# Patient Record
Sex: Female | Born: 1990 | Race: Asian | Hispanic: No | Marital: Single | Smoking: Current every day smoker
Health system: Southern US, Community
[De-identification: ages and names within clinical notes are randomized; demographics above are authoritative.]

## PROBLEM LIST (undated history)

## (undated) ENCOUNTER — Emergency Department (HOSPITAL_COMMUNITY): Payer: Self-pay | Source: Home / Self Care

## (undated) ENCOUNTER — Ambulatory Visit (HOSPITAL_COMMUNITY): Payer: Medicaid Other

## (undated) DIAGNOSIS — F32A Depression, unspecified: Secondary | ICD-10-CM

## (undated) DIAGNOSIS — F419 Anxiety disorder, unspecified: Secondary | ICD-10-CM

## (undated) DIAGNOSIS — W3400XA Accidental discharge from unspecified firearms or gun, initial encounter: Secondary | ICD-10-CM

## (undated) DIAGNOSIS — I4891 Unspecified atrial fibrillation: Secondary | ICD-10-CM

## (undated) DIAGNOSIS — Z1589 Genetic susceptibility to other disease: Secondary | ICD-10-CM

## (undated) DIAGNOSIS — F329 Major depressive disorder, single episode, unspecified: Secondary | ICD-10-CM

## (undated) HISTORY — PX: ABDOMINAL SURGERY: SHX537

## (undated) HISTORY — PX: NO PAST SURGERIES: SHX2092

---

## 2011-07-10 ENCOUNTER — Emergency Department (HOSPITAL_COMMUNITY)
Admission: EM | Admit: 2011-07-10 | Discharge: 2011-07-10 | Discharge status: Against Medical Advice | Payer: Self-pay | Attending: Emergency Medicine | Admitting: Emergency Medicine

## 2011-07-10 ENCOUNTER — Encounter: Payer: Self-pay | Admitting: *Deleted

## 2011-07-10 DIAGNOSIS — R51 Headache: Secondary | ICD-10-CM | POA: Insufficient documentation

## 2011-07-10 DIAGNOSIS — R109 Unspecified abdominal pain: Secondary | ICD-10-CM | POA: Insufficient documentation

## 2011-07-10 NOTE — ED Notes (Signed)
LLQ abdominal pain for the last 2 day.  No n/v with this.  Mild diarrhea with this.  No body aches, no fever or chills.  Pt also reports HA

## 2011-09-03 ENCOUNTER — Encounter (HOSPITAL_COMMUNITY): Payer: Self-pay | Admitting: *Deleted

## 2011-09-03 ENCOUNTER — Emergency Department (HOSPITAL_COMMUNITY)
Admission: EM | Admit: 2011-09-03 | Discharge: 2011-09-04 | Disposition: A | Discharge status: Home / Self Care | Payer: Self-pay | Attending: Emergency Medicine | Admitting: Emergency Medicine

## 2011-09-03 DIAGNOSIS — R109 Unspecified abdominal pain: Secondary | ICD-10-CM | POA: Insufficient documentation

## 2011-09-03 DIAGNOSIS — IMO0001 Reserved for inherently not codable concepts without codable children: Secondary | ICD-10-CM | POA: Insufficient documentation

## 2011-09-03 DIAGNOSIS — Z202 Contact with and (suspected) exposure to infections with a predominantly sexual mode of transmission: Secondary | ICD-10-CM | POA: Insufficient documentation

## 2011-09-03 DIAGNOSIS — R070 Pain in throat: Secondary | ICD-10-CM | POA: Insufficient documentation

## 2011-09-03 LAB — POCT PREGNANCY, URINE: Preg Test, Ur: NEGATIVE

## 2011-09-03 MED ORDER — CEFTRIAXONE SODIUM 250 MG IJ SOLR
250.0000 mg | Freq: Once | INTRAMUSCULAR | Status: AC
  Administered 2011-09-03: 250 mg via INTRAMUSCULAR
  Filled 2011-09-03: qty 250

## 2011-09-03 MED ORDER — AZITHROMYCIN 250 MG PO TABS
1000.0000 mg | ORAL_TABLET | Freq: Once | ORAL | Status: AC
  Administered 2011-09-03: 1000 mg via ORAL
  Filled 2011-09-03: qty 4

## 2011-09-03 MED ORDER — LIDOCAINE HCL (PF) 1 % IJ SOLN
INTRAMUSCULAR | Status: AC
  Administered 2011-09-03: 0.9 mL
  Filled 2011-09-03: qty 5

## 2011-09-03 NOTE — ED Provider Notes (Signed)
History     CSN: [id number]  Arrival date & time 09/03/11  2038   First MD Initiated Contact with Patient 09/03/11 2144      Chief Complaint  Patient presents with  . Influenza    (Consider location/radiation/quality/duration/timing/severity/associated sxs/prior treatment) HPI Comments: Patient has had some myalgias, sore throat.  No abdominal pain, was informed today that her partner is positive for Chlamydia.  She denies vaginal discharge at this time, but does admit to oral in the course  The history is provided by the patient.    History reviewed. No pertinent past medical history.  History reviewed. No pertinent past surgical history.  No family history on file.  History  Substance Use Topics  . Smoking status: Not on file  . Smokeless tobacco: Not on file  . Alcohol Use: No    OB History    Grav Para Term Preterm Abortions TAB SAB Ect Mult Living                  Review of Systems  Constitutional: Negative for fever and chills.  Gastrointestinal: Positive for abdominal pain. Negative for nausea, vomiting and diarrhea.  Genitourinary: Negative for vaginal discharge.  Musculoskeletal: Positive for myalgias.  Skin: Negative.     Allergies  Review of patient's allergies indicates no known allergies.  Home Medications  No current outpatient prescriptions on file.  BP 132/68  Pulse 110  Temp(Src) 98.3 F (36.8 C) (Oral)  Resp 16  SpO2 98%  Physical Exam  Constitutional: She appears well-developed and well-nourished.  HENT:  Head: Normocephalic.  Neck: Normal range of motion.  Pulmonary/Chest: Effort normal.  Abdominal: Soft. There is no tenderness. There is no rebound and no guarding.  Genitourinary: Vagina normal.  Musculoskeletal: Normal range of motion.  Neurological: She is alert.  Skin: Skin is warm and dry.  Psychiatric: She has a normal mood and affect.    ED Course  Procedures (including critical care time)  Labs Reviewed    URINALYSIS, ROUTINE W REFLEX MICROSCOPIC - Abnormal; Notable for the following:    APPearance CLOUDY (*)    Specific Gravity, Urine 1.038 (*)    Hgb urine dipstick TRACE (*)    All other components within normal limits  WET PREP, GENITAL  POCT PREGNANCY, URINE  URINE MICROSCOPIC-ADD ON  GC/CHLAMYDIA PROBE AMP, GENITAL  POCT PREGNANCY, URINE   No results found.   1. Exposure to STD       MDM  STD   Medical screening examination/treatment/procedure(s) were performed by non-physician practitioner and as supervising physician I was immediately available for consultation/collaboration. Osvaldo Human, M.D.       Arman Filter, NP 09/03/11 2312  Arman Filter, NP 09/04/11 4098  Carleene Cooper III, MD 09/05/11 [phone]

## 2011-09-03 NOTE — ED Notes (Signed)
Pt has had body aches, abdominal pain and sore throat for one week.  No n/v with this.  Pt sister had the flu recently

## 2011-09-04 LAB — URINE MICROSCOPIC-ADD ON

## 2011-09-04 LAB — URINALYSIS, ROUTINE W REFLEX MICROSCOPIC
Glucose, UA: NEGATIVE mg/dL
Specific Gravity, Urine: 1.038 — ABNORMAL HIGH (ref 1.005–1.030)

## 2011-09-05 LAB — GC/CHLAMYDIA PROBE AMP, GENITAL: GC Probe Amp, Genital: NEGATIVE

## 2011-11-19 ENCOUNTER — Emergency Department (HOSPITAL_COMMUNITY)
Admission: EM | Admit: 2011-11-19 | Discharge: 2011-11-20 | Discharge status: Against Medical Advice | Payer: BC Managed Care – PPO | Attending: Emergency Medicine | Admitting: Emergency Medicine

## 2011-11-19 ENCOUNTER — Encounter (HOSPITAL_COMMUNITY): Payer: Self-pay | Admitting: Emergency Medicine

## 2011-11-19 DIAGNOSIS — R42 Dizziness and giddiness: Secondary | ICD-10-CM | POA: Insufficient documentation

## 2011-11-19 DIAGNOSIS — H538 Other visual disturbances: Secondary | ICD-10-CM | POA: Insufficient documentation

## 2011-11-19 NOTE — ED Notes (Signed)
Pt states she receives the depo shot and has not had a period in the past 3 years but started about a week and a half ago and is still bleeding  Concerned and wants to be checked

## 2011-11-19 NOTE — ED Notes (Signed)
Pt states she woke up to go to work around 9pm with dizziness and blurred vision that are still there  Pt states as she was driving to work her vision is so blurred she could barely see  Pt came here instead for evaluation

## 2012-03-28 ENCOUNTER — Emergency Department (HOSPITAL_COMMUNITY)
Admission: EM | Admit: 2012-03-28 | Discharge: 2012-03-28 | Disposition: A | Discharge status: Home / Self Care | Payer: BC Managed Care – PPO | Attending: Emergency Medicine | Admitting: Emergency Medicine

## 2012-03-28 DIAGNOSIS — M549 Dorsalgia, unspecified: Secondary | ICD-10-CM

## 2012-03-28 DIAGNOSIS — T148XXA Other injury of unspecified body region, initial encounter: Secondary | ICD-10-CM

## 2012-03-28 DIAGNOSIS — X58XXXA Exposure to other specified factors, initial encounter: Secondary | ICD-10-CM | POA: Insufficient documentation

## 2012-03-28 DIAGNOSIS — I10 Essential (primary) hypertension: Secondary | ICD-10-CM | POA: Insufficient documentation

## 2012-03-28 LAB — POCT I-STAT, CHEM 8
Hemoglobin: 13.3 g/dL (ref 12.0–15.0)
Sodium: 141 mEq/L (ref 135–145)
TCO2: 22 mmol/L (ref 0–100)

## 2012-03-28 MED ORDER — IBUPROFEN 800 MG PO TABS: 800.0000 mg | ORAL_TABLET | Freq: Three times a day (TID) | ORAL | Status: AC | PRN

## 2012-03-28 NOTE — ED Provider Notes (Signed)
History     CSN: [id number]  Arrival date & time 03/28/12  0133   First MD Initiated Contact with Patient 03/28/12 0425      Chief Complaint  Patient presents with  . Back Pain    chest wall pain   HPI  History provided by the patient and mother. Patient is a 21 year old female with no significant past medical history who presents with complaints of left upper back pain and soreness. Symptoms began 2 days ago. Patient states she initially had some soreness in her left upper chest area but now has pain in her left mid back. Pain is worse with movements of her arm and activity. Patient denies any injury or trauma but does report increased activity playing wii video games. Patient has not taken any medications or used any treatment for symptoms. Symptoms are not associated with any shortness of breath, pleuritic pains, cough, hemoptysis, lightheadedness, nausea or diaphoresis. Patient denies having similar symptoms previously. Patient has not had any recent travel no recent surgery no prior history of cancer no prior history of DVT or PE no history of clotting disorder.    No past medical history on file.  No past surgical history on file.  Family History  Problem Relation Age of Onset  . Hypertension Other     History  Substance Use Topics  . Smoking status: Never Smoker   . Smokeless tobacco: Not on file  . Alcohol Use: No    OB History    Grav Para Term Preterm Abortions TAB SAB Ect Mult Living                  Review of Systems  Constitutional: Negative for fever, chills and diaphoresis.  HENT: Negative for neck pain.   Respiratory: Negative for cough and shortness of breath.   Gastrointestinal: Negative for nausea.  Musculoskeletal: Positive for back pain.  Neurological: Negative for dizziness, syncope and light-headedness.    Allergies  Review of patient's allergies indicates no known allergies.  Home Medications   Current Outpatient Rx  Name Route Sig  Dispense Refill  . MEDROXYPROGESTERONE ACETATE 104 MG/0.65ML [place] SUSP Subcutaneous Inject 104 mg into the skin every 3 (three) months.      BP 119/77  Pulse 77  Temp 99 F (37.2 C) (Oral)  Resp 20  SpO2 98%  Physical Exam  Nursing note and vitals reviewed. Constitutional: She is oriented to person, place, and time. She appears well-developed and well-nourished. No distress.  HENT:  Head: Normocephalic.  Cardiovascular: Normal rate and regular rhythm.   No murmur heard. Pulmonary/Chest: Effort normal and breath sounds normal. No respiratory distress. She has no wheezes. She has no rales.  Abdominal: Soft.  Musculoskeletal: Normal range of motion. She exhibits no edema and no tenderness.       Thoracic back: She exhibits tenderness. She exhibits no bony tenderness.       Back:       Reproducible pain over left lower trapezius and rhomboid area. Some tightness to the underlying muscle.  Neurological: She is alert and oriented to person, place, and time.  Skin: Skin is warm and dry. No erythema.  Psychiatric: She has a normal mood and affect. Her behavior is normal.    ED Course  Procedures  Results for orders placed during the hospital encounter of 03/28/12  POCT I-STAT, CHEM 8      Component Value Range   Sodium 141  135 - 145 mEq/L   Potassium 4.3  3.5 - 5.1 mEq/L   Chloride 107  96 - 112 mEq/L   BUN 7  6 - 23 mg/dL   Creatinine, Ser 2.13  0.50 - 1.10 mg/dL   Glucose, Bld 95  70 - 99 mg/dL   Calcium, Ion 0.86  5.78 - 1.23 mmol/L   TCO2 22  0 - 100 mmol/L   Hemoglobin 13.3  12.0 - 15.0 g/dL   HCT 46.9  62.9 - 52.8 %       1. Muscle strain   2. Back pain       MDM  4:50AM patient seen and evaluated. Patient with no recent travel, no extremity pain or swelling, no recent surgery, no prior history of cancer, no prior history of DVT or PE, no hemoptysis, no shortness of breath, no tachycardia, normal oxygen saturation. At this time doubt PE. Patient is young and  otherwise healthy with a normal EKG and symptoms are not typical for ACS. Pain is reproducible to palpation over the left trapezius and rhomboid area. Soreness and pain is exacerbated with range of motion of left upper extremity.  I discussed with the patient and mother my low suspicion for any concerning or emergent cause of symptoms. I did offer for further testing including a d-dimer to further rule out possible PE but at this time he wished to return home and try symptomatic treatment for muscle strain. They're given strict return precautions and advised he may return at anytime for continued evaluation and treatment.     Date: 03/28/2012  Rate: 79  Rhythm: normal sinus rhythm  QRS Axis: normal  Intervals: normal  ST/T Wave abnormalities: normal  Conduction Disutrbances:none and first-degree A-V block   Narrative Interpretation:   Old EKG Reviewed: none available          Angus Seller, [place] 03/28/12 [phone]

## 2012-03-28 NOTE — ED Provider Notes (Signed)
Medical screening examination/treatment/procedure(s) were performed by non-physician practitioner and as supervising physician I was immediately available for consultation/collaboration.  Melton Walls, MD 03/28/12 0825 

## 2012-03-28 NOTE — ED Notes (Signed)
Pt c/o upper back pain pt felt like heat skipped . NO LOC/N/V.VSS.PWD

## 2012-03-28 NOTE — ED Notes (Signed)
EKG printed and given to EDP [facility] for review. No previous EKG available for comparison.

## 2012-11-25 ENCOUNTER — Emergency Department (HOSPITAL_COMMUNITY)
Admission: EM | Admit: 2012-11-25 | Discharge: 2012-11-25 | Disposition: A | Discharge status: Home / Self Care | Payer: Self-pay | Attending: Emergency Medicine | Admitting: Emergency Medicine

## 2012-11-25 ENCOUNTER — Encounter (HOSPITAL_COMMUNITY): Payer: Self-pay | Admitting: Emergency Medicine

## 2012-11-25 DIAGNOSIS — R110 Nausea: Secondary | ICD-10-CM | POA: Insufficient documentation

## 2012-11-25 DIAGNOSIS — B9689 Other specified bacterial agents as the cause of diseases classified elsewhere: Secondary | ICD-10-CM

## 2012-11-25 DIAGNOSIS — N760 Acute vaginitis: Secondary | ICD-10-CM | POA: Insufficient documentation

## 2012-11-25 DIAGNOSIS — R10817 Generalized abdominal tenderness: Secondary | ICD-10-CM | POA: Insufficient documentation

## 2012-11-25 DIAGNOSIS — Z3202 Encounter for pregnancy test, result negative: Secondary | ICD-10-CM | POA: Insufficient documentation

## 2012-11-25 LAB — URINALYSIS, MICROSCOPIC ONLY
Glucose, UA: NEGATIVE mg/dL
Ketones, ur: 15 mg/dL — AB
Leukocytes, UA: NEGATIVE
Nitrite: NEGATIVE
Protein, ur: NEGATIVE mg/dL
Specific Gravity, Urine: 1.034 — ABNORMAL HIGH (ref 1.005–1.030)
Urobilinogen, UA: 1 mg/dL (ref 0.0–1.0)
pH: 5.5 (ref 5.0–8.0)

## 2012-11-25 LAB — WET PREP, GENITAL

## 2012-11-25 LAB — POCT PREGNANCY, URINE: Preg Test, Ur: NEGATIVE

## 2012-11-25 MED ORDER — AZITHROMYCIN 250 MG PO TABS
1000.0000 mg | ORAL_TABLET | Freq: Once | ORAL | Status: AC
  Administered 2012-11-25: 1000 mg via ORAL
  Filled 2012-11-25: qty 4

## 2012-11-25 MED ORDER — LIDOCAINE HCL (PF) 1 % IJ SOLN
INTRAMUSCULAR | Status: AC
  Administered 2012-11-25: 3 mL
  Filled 2012-11-25: qty 5

## 2012-11-25 MED ORDER — CEFTRIAXONE SODIUM 250 MG IJ SOLR
250.0000 mg | Freq: Once | INTRAMUSCULAR | Status: AC
  Administered 2012-11-25: 250 mg via INTRAMUSCULAR
  Filled 2012-11-25: qty 250

## 2012-11-25 MED ORDER — METRONIDAZOLE 500 MG PO TABS: 500.0000 mg | ORAL_TABLET | Freq: Two times a day (BID) | ORAL | Status: DC

## 2012-11-25 NOTE — ED Notes (Signed)
PA at bedside.

## 2012-11-25 NOTE — ED Provider Notes (Signed)
History     CSN: [id number]  Arrival date & time 11/25/12  1410   First MD Initiated Contact with Patient 11/25/12 1431      Chief Complaint  Patient presents with  . Abdominal Pain    (Consider location/radiation/quality/duration/timing/severity/associated sxs/prior treatment) HPI Comments: Patient is a 22 year old female who presents with a 2 week history of lower abdominal pain. The pain is located in her lower abdomen and does not radiate. The pain is described as cramping and moderate. The pain started gradually and progressively worsened since the onset. No alleviating/aggravating factors. The patient has tried nothing for symptoms without relief. Associated symptoms include nausea. Patient denies fever, headache, vomiting, diarrhea, chest pain, SOB, dysuria, constipation, abnormal vaginal bleeding/discharge. LMP 2010 and patient started using Depo. Patient has not used Depo in 4 months.     Patient is a 22 y.o. female presenting with abdominal pain.  Abdominal Pain Associated symptoms: nausea     History reviewed. No pertinent past medical history.  History reviewed. No pertinent past surgical history.  Family History  Problem Relation Age of Onset  . Hypertension Other     History  Substance Use Topics  . Smoking status: Never Smoker   . Smokeless tobacco: Not on file  . Alcohol Use: No    OB History   Grav Para Term Preterm Abortions TAB SAB Ect Mult Living                  Review of Systems  Gastrointestinal: Positive for nausea and abdominal pain.  All other systems reviewed and are negative.    Allergies  Review of patient's allergies indicates no known allergies.  Home Medications   Current Outpatient Rx  Name  Route  Sig  Dispense  Refill  . Multiple Vitamins-Calcium (ONE-A-DAY WOMENS FORMULA PO)   Oral   Take 1 tablet by mouth daily.           BP 143/84  Pulse 69  Temp(Src) 98.5 F (36.9 C) (Oral)  Resp 20  SpO2 99%  Physical  Exam  Nursing note and vitals reviewed. Constitutional: She is oriented to person, place, and time. She appears well-developed and well-nourished. No distress.  HENT:  Head: Normocephalic and atraumatic.  Eyes: Conjunctivae are normal.  Cardiovascular: Normal rate and regular rhythm.  Exam reveals no gallop and no friction rub.   No murmur heard. Pulmonary/Chest: Effort normal and breath sounds normal. She has no wheezes. She has no rales. She exhibits no tenderness.  Abdominal: Soft. She exhibits no distension. There is tenderness. There is no rebound and no guarding.  Generalized lower abdominal tenderness to palpation.   Genitourinary:  Thick, white discharge noted. Cervical os closed. Mild tenderness of lower abdomen on bimanual exam. No abnormal masses or adnexal tenderness noted.   Musculoskeletal: Normal range of motion.  Neurological: She is alert and oriented to person, place, and time. Coordination normal.  Speech is goal-oriented. Moves limbs without ataxia.   Skin: Skin is warm and dry.  Psychiatric: She has a normal mood and affect. Her behavior is normal.    ED Course  Procedures (including critical care time)  Labs Reviewed  WET PREP, GENITAL - Abnormal; Notable for the following:    Clue Cells Wet Prep HPF POC FEW (*)    WBC, Wet Prep HPF POC FEW (*)    All other components within normal limits  URINALYSIS, MICROSCOPIC ONLY - Abnormal; Notable for the following:    Color, Urine  AMBER (*)    APPearance HAZY (*)    Specific Gravity, Urine 1.034 (*)    Hgb urine dipstick SMALL (*)    Bilirubin Urine SMALL (*)    Ketones, ur 15 (*)    Bacteria, UA FEW (*)    Squamous Epithelial / LPF FEW (*)    All other components within normal limits  GC/CHLAMYDIA PROBE AMP  POCT PREGNANCY, URINE   No results found.   1. BV (bacterial vaginosis)       MDM  3:10 PM Labs, urinalysis, and urine pregnancy pending. Wet prep and GC chlamydia pending. Patient declines pain  medication at this time.   3:25 PM Wet prep shows clue cells and WBCs. I will treat the patient for BV and for GC/Chlamydia prophylactically. Urinalysis pending. Urine pregnancy negative. Patient instructed to return with worsening or concerning symptoms.     Emilia Beck, PA-C 11/27/12 1925

## 2012-11-25 NOTE — ED Notes (Signed)
Pt c/o lower abd pain and side pain x 2 weeks; pt sts some dark urine; pt sts LMP was in 2010 and sts went off depo in December

## 2012-11-30 NOTE — ED Provider Notes (Signed)
Medical screening examination/treatment/procedure(s) were performed by non-physician practitioner and as supervising physician I was immediately available for consultation/collaboration.  Raeford Razor, MD 11/30/12 1241

## 2013-05-11 LAB — HM HIV SCREENING LAB: HM HIV Screening: NEGATIVE

## 2014-08-01 ENCOUNTER — Emergency Department (HOSPITAL_COMMUNITY)
Admission: EM | Admit: 2014-08-01 | Discharge: 2014-08-01 | Disposition: A | Discharge status: Home / Self Care | Payer: Medicaid Other | Attending: Emergency Medicine | Admitting: Emergency Medicine

## 2014-08-01 ENCOUNTER — Encounter (HOSPITAL_COMMUNITY): Payer: Self-pay | Admitting: *Deleted

## 2014-08-01 DIAGNOSIS — Z3202 Encounter for pregnancy test, result negative: Secondary | ICD-10-CM | POA: Diagnosis not present

## 2014-08-01 DIAGNOSIS — N939 Abnormal uterine and vaginal bleeding, unspecified: Secondary | ICD-10-CM | POA: Insufficient documentation

## 2014-08-01 LAB — CBC WITH DIFFERENTIAL/PLATELET
BASOS PCT: 0 % (ref 0–1)
Basophils Absolute: 0 10*3/uL (ref 0.0–0.1)
EOS PCT: 2 % (ref 0–5)
Eosinophils Absolute: 0.1 10*3/uL (ref 0.0–0.7)
HEMATOCRIT: 38.2 % (ref 36.0–46.0)
HEMOGLOBIN: 13.3 g/dL (ref 12.0–15.0)
LYMPHS PCT: 35 % (ref 12–46)
Lymphs Abs: 2.9 10*3/uL (ref 0.7–4.0)
MCH: 28.1 pg (ref 26.0–34.0)
MCHC: 34.8 g/dL (ref 30.0–36.0)
MCV: 80.6 fL (ref 78.0–100.0)
MONO ABS: 0.4 10*3/uL (ref 0.1–1.0)
MONOS PCT: 5 % (ref 3–12)
NEUTROS ABS: 4.7 10*3/uL (ref 1.7–7.7)
Neutrophils Relative %: 58 % (ref 43–77)
Platelets: 319 10*3/uL (ref 150–400)
RBC: 4.74 MIL/uL (ref 3.87–5.11)
RDW: 13 % (ref 11.5–15.5)
WBC: 8.1 10*3/uL (ref 4.0–10.5)

## 2014-08-01 LAB — WET PREP, GENITAL
TRICH WET PREP: NONE SEEN
YEAST WET PREP: NONE SEEN

## 2014-08-01 LAB — POC URINE PREG, ED: Preg Test, Ur: NEGATIVE

## 2014-08-01 NOTE — ED Notes (Signed)
MD at bedside. 

## 2014-08-01 NOTE — ED Notes (Signed)
Pt reports vaginal bleeding. Pt states she bleeds every time she has intercourse. Pt noticed the trend 2 1/2 months ago. Last time pt had sex was yesterday. Pt states she also bleeds during sex. Pt denies clots.

## 2014-08-01 NOTE — ED Provider Notes (Signed)
CSN: [id number]     Arrival date & time 08/01/14  2009 History   First MD Initiated Contact with Patient 08/01/14 2125     Chief Complaint  Patient presents with  . Vaginal Bleeding     (Consider location/radiation/quality/duration/timing/severity/associated sxs/prior Treatment) Patient is a 23 y.o. female presenting with vaginal bleeding. The history is provided by the patient.  Vaginal Bleeding Quality:  Bright red Severity:  Moderate Duration: 2 and a half months. Timing:  Intermittent (only during sex) Progression:  Unchanged Chronicity:  New Possible pregnancy: no   Context: during intercourse   Relieved by: not having intercourse. Worsened by:  Nothing tried Ineffective treatments:  None tried Associated symptoms: no abdominal pain, no back pain, no dizziness, no dysuria, no fatigue, no fever, no nausea and no vaginal discharge     History reviewed. No pertinent past medical history. History reviewed. No pertinent past surgical history. Family History  Problem Relation Age of Onset  . Hypertension Other    History  Substance Use Topics  . Smoking status: Never Smoker   . Smokeless tobacco: Not on file  . Alcohol Use: No   OB History    No data available     Review of Systems  Constitutional: Negative for fever, chills, diaphoresis, activity change, appetite change and fatigue.  HENT: Negative for facial swelling, rhinorrhea, sore throat, trouble swallowing and voice change.   Eyes: Negative for photophobia, pain and visual disturbance.  Respiratory: Negative for cough, shortness of breath, wheezing and stridor.   Cardiovascular: Negative for chest pain, palpitations and leg swelling.  Gastrointestinal: Negative for nausea, vomiting, abdominal pain, constipation and anal bleeding.  Endocrine: Negative.   Genitourinary: Positive for vaginal bleeding. Negative for dysuria, urgency, hematuria, vaginal discharge, vaginal pain, menstrual problem and pelvic pain.   Musculoskeletal: Negative for myalgias, back pain and arthralgias.  Skin: Negative.  Negative for rash.  Allergic/Immunologic: Negative.   Neurological: Negative for dizziness, tremors, syncope, weakness and headaches.  Psychiatric/Behavioral: Negative for suicidal ideas, sleep disturbance and self-injury.  All other systems reviewed and are negative.     Allergies  Review of patient's allergies indicates no known allergies.  Home Medications   Prior to Admission medications   Not on File   BP 118/75 mmHg  Pulse 68  Temp(Src) 98.4 F (36.9 C) (Oral)  Resp 16  Ht 5\' 6"  (1.676 m)  Wt 240 lb (108.863 kg)  BMI 38.76 kg/m2  SpO2 100% Physical Exam  Constitutional: She is oriented to person, place, and time. She appears well-developed and well-nourished. No distress.  HENT:  Head: Normocephalic and atraumatic.  Right Ear: External ear normal.  Left Ear: External ear normal.  Mouth/Throat: Oropharynx is clear and moist. No oropharyngeal exudate.  Eyes: Conjunctivae and EOM are normal. Pupils are equal, round, and reactive to light. No scleral icterus.  Neck: Normal range of motion. Neck supple. No JVD present. No tracheal deviation present. No thyromegaly present.  Cardiovascular: Normal rate, regular rhythm and intact distal pulses.  Exam reveals no gallop and no friction rub.   No murmur heard. Pulmonary/Chest: Effort normal and breath sounds normal. No respiratory distress. She has no wheezes. She has no rales.  Abdominal: Soft. Bowel sounds are normal. She exhibits no distension. There is no tenderness.  Genitourinary: Vagina normal and uterus normal. Pelvic exam was performed with patient supine. There is no rash or tenderness on the right labia. There is no rash or tenderness on the left labia. Uterus is not deviated  and not enlarged. Cervix exhibits no motion tenderness, no discharge and no friability. Right adnexum displays no mass, no tenderness and no fullness. Left  adnexum displays no mass, no tenderness and no fullness. No signs of injury around the vagina. No vaginal discharge found.  Musculoskeletal: Normal range of motion. She exhibits no edema or tenderness.  Neurological: She is alert and oriented to person, place, and time. No cranial nerve deficit. She exhibits normal muscle tone. Coordination normal.  Skin: Skin is warm and dry. She is not diaphoretic. No pallor.  Psychiatric: She has a normal mood and affect. She expresses no homicidal and no suicidal ideation. She expresses no suicidal plans and no homicidal plans.  Nursing note and vitals reviewed.   ED Course  Procedures (including critical care time) Labs Review Labs Reviewed  WET PREP, GENITAL - Abnormal; Notable for the following:    Clue Cells Wet Prep HPF POC MANY (*)    WBC, Wet Prep HPF POC MANY (*)    All other components within normal limits  GC/CHLAMYDIA PROBE AMP  CBC WITH DIFFERENTIAL  RPR  HIV ANTIBODY (ROUTINE TESTING)  POC URINE PREG, ED    Imaging Review No results found.   EKG Interpretation None      MDM   Final diagnoses:  Vaginal bleeding    The patient is a 23 y.o. Female who presents for 2 and a half months of vaginal bleeding during intercourse. Urine preg negative. Hgb wnl. Pelvic exam shows no friability of the service or CMT. Wet prep only remarkable for clue cells but given no vaginal discharge and symptoms only present during intercourse will not treat with flagyl. STD labs sent and will be followed as outpatient. Patient discharged with GYN outpatient followup and standard ED return precautions. Patient expresses understanding and agreement with this plan.   Patient seen with attending, Dr. Criss AlvineGoldston, who oversaw clinical decision making.     Lula OlszewskiMike Shanard Treto, MD 08/02/14 [id number]  Audree CamelScott T Goldston, MD 08/09/14 [phone]

## 2014-08-01 NOTE — ED Notes (Signed)
Discharge instructions reviewed, voiced understanding.  Patient [facility] in the AM for an appointment

## 2014-08-02 LAB — RPR

## 2014-08-02 LAB — HIV ANTIBODY (ROUTINE TESTING W REFLEX): HIV 1&2 Ab, 4th Generation: NONREACTIVE

## 2014-08-03 LAB — GC/CHLAMYDIA PROBE AMP
CT PROBE, AMP APTIMA: NEGATIVE
GC PROBE AMP APTIMA: NEGATIVE

## 2014-08-18 ENCOUNTER — Encounter: Payer: BC Managed Care – PPO | Admitting: Family Medicine

## 2014-09-07 ENCOUNTER — Encounter (HOSPITAL_COMMUNITY): Payer: Self-pay

## 2014-09-07 ENCOUNTER — Emergency Department (HOSPITAL_COMMUNITY): Payer: Medicaid Other

## 2014-09-07 ENCOUNTER — Emergency Department (HOSPITAL_COMMUNITY)
Admission: EM | Admit: 2014-09-07 | Discharge: 2014-09-07 | Disposition: A | Discharge status: Home / Self Care | Payer: Medicaid Other | Attending: Emergency Medicine | Admitting: Emergency Medicine

## 2014-09-07 DIAGNOSIS — S3992XA Unspecified injury of lower back, initial encounter: Secondary | ICD-10-CM | POA: Diagnosis present

## 2014-09-07 DIAGNOSIS — Y9389 Activity, other specified: Secondary | ICD-10-CM | POA: Diagnosis not present

## 2014-09-07 DIAGNOSIS — M546 Pain in thoracic spine: Secondary | ICD-10-CM | POA: Diagnosis not present

## 2014-09-07 DIAGNOSIS — S39012A Strain of muscle, fascia and tendon of lower back, initial encounter: Secondary | ICD-10-CM | POA: Diagnosis not present

## 2014-09-07 DIAGNOSIS — Z3202 Encounter for pregnancy test, result negative: Secondary | ICD-10-CM | POA: Insufficient documentation

## 2014-09-07 DIAGNOSIS — Y998 Other external cause status: Secondary | ICD-10-CM | POA: Insufficient documentation

## 2014-09-07 DIAGNOSIS — Y9289 Other specified places as the place of occurrence of the external cause: Secondary | ICD-10-CM | POA: Insufficient documentation

## 2014-09-07 DIAGNOSIS — M549 Dorsalgia, unspecified: Secondary | ICD-10-CM

## 2014-09-07 DIAGNOSIS — X58XXXA Exposure to other specified factors, initial encounter: Secondary | ICD-10-CM | POA: Insufficient documentation

## 2014-09-07 LAB — URINALYSIS, ROUTINE W REFLEX MICROSCOPIC
Bilirubin Urine: NEGATIVE
GLUCOSE, UA: NEGATIVE mg/dL
Hgb urine dipstick: NEGATIVE
Ketones, ur: NEGATIVE mg/dL
LEUKOCYTES UA: NEGATIVE
NITRITE: NEGATIVE
PROTEIN: NEGATIVE mg/dL
SPECIFIC GRAVITY, URINE: 1.037 — AB (ref 1.005–1.030)
Urobilinogen, UA: 0.2 mg/dL (ref 0.0–1.0)
pH: 5.5 (ref 5.0–8.0)

## 2014-09-07 LAB — POC URINE PREG, ED: PREG TEST UR: NEGATIVE

## 2014-09-07 MED ORDER — KETOROLAC TROMETHAMINE 60 MG/2ML IM SOLN
30.0000 mg | Freq: Once | INTRAMUSCULAR | Status: AC
  Administered 2014-09-07: 30 mg via INTRAMUSCULAR
  Filled 2014-09-07: qty 2

## 2014-09-07 MED ORDER — DIAZEPAM 5 MG PO TABS: 5.0000 mg | ORAL_TABLET | Freq: Three times a day (TID) | ORAL | Status: DC | PRN

## 2014-09-07 NOTE — Discharge Instructions (Signed)
Back Pain, Adult Low back pain is very common. About 1 in 5 people have back pain.The cause of low back pain is rarely dangerous. The pain often gets better over time.About half of people with a sudden onset of back pain feel better in just 2 weeks. About 8 in 10 people feel better by 6 weeks.  CAUSES Some common causes of back pain include:  Strain of the muscles or ligaments supporting the spine.  Wear and tear (degeneration) of the spinal discs.  Arthritis.  Direct injury to the back. DIAGNOSIS Most of the time, the direct cause of low back pain is not known.However, back pain can be treated effectively even when the exact cause of the pain is unknown.Answering your caregiver's questions about your overall health and symptoms is one of the most accurate ways to make sure the cause of your pain is not dangerous. If your caregiver needs more information, he or she may order lab work or imaging tests (X-rays or MRIs).However, even if imaging tests show changes in your back, this usually does not require surgery. HOME CARE INSTRUCTIONS For many people, back pain returns.Since low back pain is rarely dangerous, it is often a condition that people can learn to manageon their own.   Remain active. It is stressful on the back to sit or stand in one place. Do not sit, drive, or stand in one place for more than 30 minutes at a time. Take short walks on level surfaces as soon as pain allows.Try to increase the length of time you walk each day.  Do not stay in bed.Resting more than 1 or 2 days can delay your recovery.  Do not avoid exercise or work.Your body is made to move.It is not dangerous to be active, even though your back may hurt.Your back will likely heal faster if you return to being active before your pain is gone.  Pay attention to your body when you bend and lift. Many people have less discomfortwhen lifting if they bend their knees, keep the load close to their bodies,and  avoid twisting. Often, the most comfortable positions are those that put less stress on your recovering back.  Find a comfortable position to sleep. Use a firm mattress and lie on your side with your knees slightly bent. If you lie on your back, put a pillow under your knees.  Only take over-the-counter or prescription medicines as directed by your caregiver. Over-the-counter medicines to reduce pain and inflammation are often the most helpful.Your caregiver may prescribe muscle relaxant drugs.These medicines help dull your pain so you can more quickly return to your normal activities and healthy exercise.  Put ice on the injured area.  Put ice in a plastic bag.  Place a towel between your skin and the bag.  Leave the ice on for 15-20 minutes, 03-04 times a day for the first 2 to 3 days. After that, ice and heat may be alternated to reduce pain and spasms.  Ask your caregiver about trying back exercises and gentle massage. This may be of some benefit.  Avoid feeling anxious or stressed.Stress increases muscle tension and can worsen back pain.It is important to recognize when you are anxious or stressed and learn ways to manage it.Exercise is a great option. SEEK MEDICAL CARE IF:  You have pain that is not relieved with rest or medicine.  You have pain that does not improve in 1 week.  You have new symptoms.  You are generally not feeling well. SEEK   IMMEDIATE MEDICAL CARE IF:   You have pain that radiates from your back into your legs.  You develop new bowel or bladder control problems.  You have unusual weakness or numbness in your arms or legs.  You develop nausea or vomiting.  You develop abdominal pain.  You feel faint. Document Released: 07/28/2005 Document Revised: 01/27/2012 Document Reviewed: 11/29/2013 Exit[employer] Patient Information 2015 [employer]. This information is not intended to replace advice given to you by your health care provider. Make sure you  discuss any questions you have with your health care provider.  

## 2014-09-07 NOTE — ED Provider Notes (Signed)
CSN: [id number]     Arrival date & time 09/07/14  [id number] History   First MD Initiated Contact with Patient 09/07/14 [phone]     Chief Complaint  Patient presents with  . Back Pain     (Consider location/radiation/quality/duration/timing/severity/associated sxs/prior Treatment) Patient is a 24 y.o. female presenting with back pain.  Back Pain Location:  Thoracic spine and lumbar spine Quality:  Aching and shooting Radiates to:  Does not radiate Pain severity:  Moderate Pain is:  Same all the time Onset quality:  Sudden Duration:  3 days Timing:  Constant Progression:  Unchanged Chronicity:  New Context comment:  Sitting up from lying with child in arms Relieved by:  Nothing Worsened by:  Palpation, touching, twisting and movement Associated symptoms: no abdominal pain, no bladder incontinence, no bowel incontinence, no numbness, no paresthesias, no perianal numbness and no tingling     History reviewed. No pertinent past medical history. History reviewed. No pertinent past surgical history. Family History  Problem Relation Age of Onset  . Hypertension Other    History  Substance Use Topics  . Smoking status: Never Smoker   . Smokeless tobacco: Not on file  . Alcohol Use: No   OB History    No data available     Review of Systems  Gastrointestinal: Negative for abdominal pain and bowel incontinence.  Genitourinary: Negative for bladder incontinence.  Musculoskeletal: Positive for back pain.  Neurological: Negative for tingling, numbness and paresthesias.  All other systems reviewed and are negative.     Allergies  Review of patient's allergies indicates no known allergies.  Home Medications   Prior to Admission medications   Medication Sig Start Date End Date Taking? Authorizing Provider  acetaminophen (TYLENOL) 500 MG tablet Take 1,000 mg by mouth every 6 (six) hours as needed for mild pain or headache.   Yes Historical Provider, MD  ibuprofen (ADVIL,MOTRIN)  800 MG tablet Take 800 mg by mouth once.   Yes Historical Provider, MD  oxyCODONE-acetaminophen (PERCOCET/ROXICET) 5-325 MG per tablet Take 1 tablet by mouth once.   Yes Historical Provider, MD  diazepam (VALIUM) 5 MG tablet Take 1 tablet (5 mg total) by mouth every 8 (eight) hours as needed for muscle spasms. 09/07/14   Mirian MoMatthew Kayci [place], MD   BP 122/71 mmHg  Pulse 80  Temp(Src) 98.2 F (36.8 C) (Oral)  Resp 20  SpO2 98% Physical Exam  Constitutional: She is oriented to person, place, and time. She appears well-developed and well-nourished.  HENT:  Head: Normocephalic and atraumatic.  Right Ear: External ear normal.  Left Ear: External ear normal.  Eyes: Conjunctivae and EOM are normal. Pupils are equal, round, and reactive to light.  Neck: Normal range of motion. Neck supple.  Cardiovascular: Normal rate, regular rhythm, normal heart sounds and intact distal pulses.   Pulmonary/Chest: Effort normal and breath sounds normal.  Abdominal: Soft. Bowel sounds are normal. There is no tenderness.  Musculoskeletal: Normal range of motion.       Thoracic back: She exhibits tenderness and bony tenderness.       Lumbar back: Normal.  Neurological: She is alert and oriented to person, place, and time. She has normal strength. No sensory deficit. Gait normal.  Skin: Skin is warm and dry.  Vitals reviewed.   ED Course  Procedures (including critical care time) Labs Review Labs Reviewed  URINALYSIS, ROUTINE W REFLEX MICROSCOPIC - Abnormal; Notable for the following:    Color, Urine AMBER (*)    APPearance CLOUDY (*)  Specific Gravity, Urine 1.037 (*)    All other components within normal limits  POC URINE PREG, ED    Imaging Review Dg Thoracic Spine W/swimmers  09/07/2014   CLINICAL DATA:  Three-day history of pain.  No history of trauma  EXAM: THORACIC SPINE - 2 VIEW + SWIMMERS  COMPARISON:  None.  FINDINGS: Frontal, lateral, and swimmer's views were obtained. There is slight mid  thoracic levoscoliosis. There is no fracture or spondylolisthesis. Disc spaces appear intact. No erosive change.  IMPRESSION: Slight scoliosis. No fracture or spondylolisthesis. No appreciable arthropathy.   Electronically Signed   By: Bretta Bang M.D.   On: 09/07/2014 09:36   Dg Lumbar Spine Complete  09/07/2014   CLINICAL DATA:  Initial encounter for mid upper back and low back pain for 3 days with twisting injury.  EXAM: LUMBAR SPINE - COMPLETE 4+ VIEW  COMPARISON:  None.  FINDINGS: Five lumbar type vertebral bodies. Sacroiliac joints are symmetric. Maintenance of vertebral body height. Mild straightening of expected lumbar lordosis. Intervertebral disc heights are maintained.  IMPRESSION: No acute osseous abnormality.   Electronically Signed   By: Jeronimo Greaves M.D.   On: 09/07/2014 09:36     EKG Interpretation None      MDM   Final diagnoses:  Back pain  Lumbar strain, initial encounter    24 y.o. female without pertinent PMH presents with atraumatic back pain as above. Physical exam with midline tenderness of thoracic spine. No neurologic deficit. Doubt cauda equina based on history and physical exam.  Wu unremarkable, negative ua.  DC home in stable condition with standard return precautions.  I have reviewed all laboratory and imaging studies if ordered as above  1. Lumbar strain, initial encounter   2. Back pain   3. Back pain        Mirian Mo, MD 09/08/14 [phone]

## 2014-09-07 NOTE — ED Notes (Signed)
Pt states back pain x 3 days.  Holding nephew and went to stand up.  Felt tug in back

## 2015-05-03 ENCOUNTER — Encounter (HOSPITAL_COMMUNITY): Payer: Self-pay

## 2015-05-03 ENCOUNTER — Emergency Department (HOSPITAL_COMMUNITY)
Admission: EM | Admit: 2015-05-03 | Discharge: 2015-05-04 | Disposition: A | Discharge status: Home / Self Care | Payer: Medicaid Other | Attending: Emergency Medicine | Admitting: Emergency Medicine

## 2015-05-03 DIAGNOSIS — J069 Acute upper respiratory infection, unspecified: Secondary | ICD-10-CM | POA: Insufficient documentation

## 2015-05-03 DIAGNOSIS — R52 Pain, unspecified: Secondary | ICD-10-CM | POA: Diagnosis present

## 2015-05-03 DIAGNOSIS — Z792 Long term (current) use of antibiotics: Secondary | ICD-10-CM | POA: Diagnosis not present

## 2015-05-03 DIAGNOSIS — Z7951 Long term (current) use of inhaled steroids: Secondary | ICD-10-CM | POA: Diagnosis not present

## 2015-05-03 DIAGNOSIS — J309 Allergic rhinitis, unspecified: Secondary | ICD-10-CM | POA: Insufficient documentation

## 2015-05-03 LAB — COMPREHENSIVE METABOLIC PANEL
ALK PHOS: 59 U/L (ref 38–126)
ALT: 20 U/L (ref 14–54)
ANION GAP: 5 (ref 5–15)
AST: 22 U/L (ref 15–41)
Albumin: 3.5 g/dL (ref 3.5–5.0)
BUN: 6 mg/dL (ref 6–20)
CALCIUM: 8.6 mg/dL — AB (ref 8.9–10.3)
CO2: 27 mmol/L (ref 22–32)
Chloride: 107 mmol/L (ref 101–111)
Creatinine, Ser: 0.69 mg/dL (ref 0.44–1.00)
GFR calc non Af Amer: >60 mL/min (ref >60)
Glucose, Bld: 93 mg/dL (ref 65–99)
Potassium: 4 mmol/L (ref 3.5–5.1)
SODIUM: 139 mmol/L (ref 135–145)
TOTAL PROTEIN: 6.8 g/dL (ref 6.5–8.1)
Total Bilirubin: 0.1 mg/dL — ABNORMAL LOW (ref 0.3–1.2)

## 2015-05-03 LAB — CBC WITH DIFFERENTIAL/PLATELET
BASOS ABS: 0 10*3/uL (ref 0.0–0.1)
BASOS PCT: 0 %
Eosinophils Absolute: 0.2 10*3/uL (ref 0.0–0.7)
Eosinophils Relative: 2 %
HEMATOCRIT: 38.9 % (ref 36.0–46.0)
HEMOGLOBIN: 13.5 g/dL (ref 12.0–15.0)
Lymphocytes Relative: 32 %
Lymphs Abs: 3.1 10*3/uL (ref 0.7–4.0)
MCH: 28 pg (ref 26.0–34.0)
MCHC: 34.7 g/dL (ref 30.0–36.0)
MCV: 80.7 fL (ref 78.0–100.0)
MONOS PCT: 6 %
Monocytes Absolute: 0.6 10*3/uL (ref 0.1–1.0)
NEUTROS ABS: 5.7 10*3/uL (ref 1.7–7.7)
NEUTROS PCT: 60 %
Platelets: 335 10*3/uL (ref 150–400)
RBC: 4.82 MIL/uL (ref 3.87–5.11)
RDW: 13.4 % (ref 11.5–15.5)
WBC: 9.6 10*3/uL (ref 4.0–10.5)

## 2015-05-03 MED ORDER — CETIRIZINE HCL 10 MG PO TABS: 10.0000 mg | ORAL_TABLET | Freq: Every day | ORAL | Status: DC

## 2015-05-03 MED ORDER — FLUTICASONE PROPIONATE 50 MCG/ACT NA SUSP: 2.0000 | Freq: Every day | NASAL | Status: DC

## 2015-05-03 MED ORDER — NAPROXEN 250 MG PO TABS: 250.0000 mg | ORAL_TABLET | Freq: Two times a day (BID) | ORAL | Status: DC

## 2015-05-03 MED ORDER — BENZONATATE 100 MG PO CAPS: 100.0000 mg | ORAL_CAPSULE | Freq: Three times a day (TID) | ORAL | Status: DC

## 2015-05-03 NOTE — ED Provider Notes (Signed)
CSN: [id number]     Arrival date & time 05/03/15  1849 History   First MD Initiated Contact with Patient 05/03/15 1953     Chief Complaint  Patient presents with  . generalized pain    Sherald Jernigan is a 24 y.o. female who is otherwise healthy who presents the ED complaining of ongoing nasal congestion, postnasal drip, and bilateral ear pressure. The patient also reports starting today she has had a dry cough and body aches. Patient reports that 7 days ago she was seen at urgent care and given a prescription for amoxicillin for sinus infection. She reports she's had symptoms now for 10 days. She reports amoxicillin did not help. She reports her sinus congestion is slowly getting better but is still there. Patient also reports scratchy throat. The patient denies fevers, chills, ear pain, ear discharge, wheezing, shortness of breath, neck pain, neck stiffness, abdominal pain, nausea, vomiting, urinary symptoms, or rashes.  (Consider location/radiation/quality/duration/timing/severity/associated sxs/prior Treatment) HPI  History reviewed. No pertinent past medical history. History reviewed. No pertinent past surgical history. Family History  Problem Relation Age of Onset  . Hypertension Other    Social History  Substance Use Topics  . Smoking status: Never Smoker   . Smokeless tobacco: None  . Alcohol Use: No   OB History    No data available     Review of Systems  Constitutional: Negative for fever and chills.  HENT: Positive for congestion, postnasal drip and rhinorrhea. Negative for ear discharge, facial swelling, nosebleeds, sore throat and trouble swallowing.   Eyes: Negative for visual disturbance.  Respiratory: Positive for cough. Negative for shortness of breath and wheezing.   Cardiovascular: Negative for chest pain and palpitations.  Gastrointestinal: Negative for nausea, vomiting and abdominal pain.  Genitourinary: Negative for dysuria, urgency, frequency, hematuria and  difficulty urinating.  Musculoskeletal: Positive for myalgias. Negative for back pain, neck pain and neck stiffness.  Skin: Negative for rash.  Neurological: Negative for dizziness, light-headedness and headaches.      Allergies  Review of patient's allergies indicates no known allergies.  Home Medications   Prior to Admission medications   Medication Sig Start Date End Date Taking? Authorizing Provider  acetaminophen (TYLENOL) 500 MG tablet Take 1,000 mg by mouth every 6 (six) hours as needed for mild pain or headache.   Yes Historical Provider, MD  amoxicillin (AMOXIL) 875 MG tablet Take 875 mg by mouth 2 (two) times daily.   Yes Historical Provider, MD  diphenhydrAMINE (SOMINEX) 25 MG tablet Take 75 mg by mouth daily as needed for itching, allergies or sleep.   Yes Historical Provider, MD  benzonatate (TESSALON) 100 MG capsule Take 1 capsule (100 mg total) by mouth every 8 (eight) hours. 05/03/15   Everlene Farrier, PA-C  cetirizine (ZYRTEC ALLERGY) 10 MG tablet Take 1 tablet (10 mg total) by mouth daily. 05/03/15   Everlene Farrier, PA-C  diazepam (VALIUM) 5 MG tablet Take 1 tablet (5 mg total) by mouth every 8 (eight) hours as needed for muscle spasms. Patient not taking: Reported on 05/03/2015 09/07/14   Mirian Mo, MD  fluticasone [facility]) 50 MCG/ACT nasal spray Place 2 sprays into both nostrils daily. 05/03/15   Everlene Farrier, PA-C  naproxen (NAPROSYN) 250 MG tablet Take 1 tablet (250 mg total) by mouth 2 (two) times daily with a meal. 05/03/15   Everlene Farrier, PA-C   BP 112/68 mmHg  Pulse 62  Temp(Src) 98.2 F (36.8 C) (Oral)  Resp 18  SpO2 98% Physical  Exam  Constitutional: She is oriented to person, place, and time. She appears well-developed and well-nourished. No distress.  Nontoxic appearing.  HENT:  Head: Normocephalic and atraumatic.  Right Ear: External ear normal.  Left Ear: External ear normal.  Mouth/Throat: Oropharynx is clear and moist. No oropharyngeal  exudate.  Boggy nasal turbinates bilaterally. Mild bilateral middle ear effusion without TM erythema or loss of landmarks. Oropharynx is clear without tonsillar hypertrophy or exudates. No maxillary or frontal sinus tenderness to palpation.  Eyes: Conjunctivae are normal. Pupils are equal, round, and reactive to light. Right eye exhibits no discharge. Left eye exhibits no discharge.  Neck: Normal range of motion. Neck supple.  No cervical lymphadenopathy.  Cardiovascular: Normal rate, regular rhythm, normal heart sounds and intact distal pulses.   Pulmonary/Chest: Effort normal and breath sounds normal. No respiratory distress. She has no wheezes. She has no rales.  Lungs are clear to auscultation bilaterally.  Abdominal: Soft. There is no tenderness. There is no guarding.  Abdomen is soft and nontender to palpation.  Musculoskeletal: She exhibits no edema or tenderness.  No lower extremity edema or tenderness.  Lymphadenopathy:    She has no cervical adenopathy.  Neurological: She is alert and oriented to person, place, and time. Coordination normal.  Skin: Skin is warm and dry. No rash noted. She is not diaphoretic. No erythema. No pallor.  Psychiatric: She has a normal mood and affect. Her behavior is normal.  Nursing note and vitals reviewed.   ED Course  Procedures (including critical care time) Labs Review Labs Reviewed  CBC WITH DIFFERENTIAL/PLATELET  COMPREHENSIVE METABOLIC PANEL    Imaging Review No results found.    EKG Interpretation None      Filed Vitals:   05/03/15 1900 05/03/15 2116  BP: 128/89 112/68  Pulse: 79 62  Temp: 98.2 F (36.8 C)   TempSrc: Oral   Resp: 16 18  SpO2: 100% 98%     MDM   Meds given in ED:  Medications - No data to display  New Prescriptions   BENZONATATE (TESSALON) 100 MG CAPSULE    Take 1 capsule (100 mg total) by mouth every 8 (eight) hours.   CETIRIZINE (ZYRTEC ALLERGY) 10 MG TABLET    Take 1 tablet (10 mg total) by  mouth daily.   FLUTICASONE (FLONASE) 50 MCG/ACT NASAL SPRAY    Place 2 sprays into both nostrils daily.   NAPROXEN (NAPROSYN) 250 MG TABLET    Take 1 tablet (250 mg total) by mouth 2 (two) times daily with a meal.    Final diagnoses:  URI (upper respiratory infection)  Allergic rhinitis, unspecified allergic rhinitis type   This is a 24 y.o. female who is otherwise healthy who presents the ED complaining of ongoing nasal congestion, postnasal drip, and bilateral ear pressure. The patient also reports starting today she has had a dry cough and body aches. Patient reports that 7 days ago she was seen at urgent care and given a prescription for amoxicillin for sinus infection. She reports she's had symptoms now for 10 days. She reports amoxicillin did not help.  On exam patient is afebrile nontoxic appearing. She has boggy nasal turbinates bilaterally. Her throat is clear. Her lungs are clear to auscultation bilaterally. She has no maxillary or frontal sinus tenderness to palpation.   The patient has symptoms of an URI. Will discharge with prescriptions for flonase, zyrtec, tessalon and naproxen and have her follow up with her PCP. I advised the patient to follow-up  with their primary care provider this week. I advised the patient to return to the emergency department with new or worsening symptoms or new concerns. The patient verbalized understanding and agreement with plan.       Everlene Farrier, PA-C 05/04/15 0113  Melene Plan, DO 05/04/15 1539

## 2015-05-03 NOTE — ED Notes (Signed)
Pt seen at primary MD a week ago.  Given antibiotic for sinus infection.  Pt states continues with sinus infection and body aches.  Vomited x 1.

## 2015-05-03 NOTE — Discharge Instructions (Signed)
Upper Respiratory Infection, Adult An upper respiratory infection (URI) is also sometimes known as the common cold. The upper respiratory tract includes the nose, sinuses, throat, trachea, and bronchi. Bronchi are the airways leading to the lungs. Most people improve within 1 week, but symptoms can last up to 2 weeks. A residual cough may last even longer.  CAUSES Many different viruses can infect the tissues lining the upper respiratory tract. The tissues become irritated and inflamed and often become very moist. Mucus production is also common. A cold is contagious. You can easily spread the virus to others by oral contact. This includes kissing, sharing a glass, coughing, or sneezing. Touching your mouth or nose and then touching a surface, which is then touched by another person, can also spread the virus. SYMPTOMS  Symptoms typically develop 1 to 3 days after you come in contact with a cold virus. Symptoms vary from person to person. They may include:  Runny nose.  Sneezing.  Nasal congestion.  Sinus irritation.  Sore throat.  Loss of voice (laryngitis).  Cough.  Fatigue.  Muscle aches.  Loss of appetite.  Headache.  Low-grade fever. DIAGNOSIS  You might diagnose your own cold based on familiar symptoms, since most people get a cold 2 to 3 times a year. Your caregiver can confirm this based on your exam. Most importantly, your caregiver can check that your symptoms are not due to another disease such as strep throat, sinusitis, pneumonia, asthma, or epiglottitis. Blood tests, throat tests, and X-rays are not necessary to diagnose a common cold, but they may sometimes be helpful in excluding other more serious diseases. Your caregiver will decide if any further tests are required. RISKS AND COMPLICATIONS  You may be at risk for a more severe case of the common cold if you smoke cigarettes, have chronic heart disease (such as heart failure) or lung disease (such as asthma), or if  you have a weakened immune system. The very young and very old are also at risk for more serious infections. Bacterial sinusitis, middle ear infections, and bacterial pneumonia can complicate the common cold. The common cold can worsen asthma and chronic obstructive pulmonary disease (COPD). Sometimes, these complications can require emergency medical care and may be life-threatening. PREVENTION  The best way to protect against getting a cold is to practice good hygiene. Avoid oral or hand contact with people with cold symptoms. Wash your hands often if contact occurs. There is no clear evidence that vitamin C, vitamin E, echinacea, or exercise reduces the chance of developing a cold. However, it is always recommended to get plenty of rest and practice good nutrition. TREATMENT  Treatment is directed at relieving symptoms. There is no cure. Antibiotics are not effective, because the infection is caused by a virus, not by bacteria. Treatment may include:  Increased fluid intake. Sports drinks offer valuable electrolytes, sugars, and fluids.  Breathing heated mist or steam (vaporizer or shower).  Eating chicken soup or other clear broths, and maintaining good nutrition.  Getting plenty of rest.  Using gargles or lozenges for comfort.  Controlling fevers with ibuprofen or acetaminophen as directed by your caregiver.  Increasing usage of your inhaler if you have asthma. Zinc gel and zinc lozenges, taken in the first 24 hours of the common cold, can shorten the duration and lessen the severity of symptoms. Pain medicines may help with fever, muscle aches, and throat pain. A variety of non-prescription medicines are available to treat congestion and runny nose. Your caregiver   can make recommendations and may suggest nasal or lung inhalers for other symptoms.  HOME CARE INSTRUCTIONS   Only take over-the-counter or prescription medicines for pain, discomfort, or fever as directed by your  caregiver.  Use a warm mist humidifier or inhale steam from a shower to increase air moisture. This may keep secretions moist and make it easier to breathe.  Drink enough water and fluids to keep your urine clear or pale yellow.  Rest as needed.  Return to work when your temperature has returned to normal or as your caregiver advises. You may need to stay home longer to avoid infecting others. You can also use a face mask and careful hand washing to prevent spread of the virus. SEEK MEDICAL CARE IF:   After the first few days, you feel you are getting worse rather than better.  You need your caregiver's advice about medicines to control symptoms.  You develop chills, worsening shortness of breath, or brown or red sputum. These may be signs of pneumonia.  You develop yellow or brown nasal discharge or pain in the face, especially when you bend forward. These may be signs of sinusitis.  You develop a fever, swollen neck glands, pain with swallowing, or white areas in the back of your throat. These may be signs of strep throat. SEEK IMMEDIATE MEDICAL CARE IF:   You have a fever.  You develop severe or persistent headache, ear pain, sinus pain, or chest pain.  You develop wheezing, a prolonged cough, cough up blood, or have a change in your usual mucus (if you have chronic lung disease).  You develop sore muscles or a stiff neck. Document Released: 01/21/2001 Document Revised: 10/20/2011 Document Reviewed: 11/02/2013 Exit[employer] Patient Information 2015 [employer]. This information is not intended to replace advice given to you by your health care provider. Make sure you discuss any questions you have with your health care provider. Allergic Rhinitis Allergic rhinitis is when the mucous membranes in the nose respond to allergens. Allergens are particles in the air that cause your body to have an allergic reaction. This causes you to release allergic antibodies. Through a chain of  events, these eventually cause you to release histamine into the blood stream. Although meant to protect the body, it is this release of histamine that causes your discomfort, such as frequent sneezing, congestion, and an itchy, runny nose.  CAUSES  Seasonal allergic rhinitis (hay fever) is caused by pollen allergens that may come from grasses, trees, and weeds. Year-round allergic rhinitis (perennial allergic rhinitis) is caused by allergens such as house dust mites, pet dander, and mold spores.  SYMPTOMS   Nasal stuffiness (congestion).  Itchy, runny nose with sneezing and tearing of the eyes. DIAGNOSIS  Your health care provider can help you determine the allergen or allergens that trigger your symptoms. If you and your health care provider are unable to determine the allergen, skin or blood testing may be used. TREATMENT  Allergic rhinitis does not have a cure, but it can be controlled by:  Medicines and allergy shots (immunotherapy).  Avoiding the allergen. Hay fever may often be treated with antihistamines in pill or nasal spray forms. Antihistamines block the effects of histamine. There are over-the-counter medicines that may help with nasal congestion and swelling around the eyes. Check with your health care provider before taking or giving this medicine.  If avoiding the allergen or the medicine prescribed do not work, there are many new medicines your health care provider can prescribe.   Stronger medicine may be used if initial measures are ineffective. Desensitizing injections can be used if medicine and avoidance does not work. Desensitization is when a patient is given ongoing shots until the body becomes less sensitive to the allergen. Make sure you follow up with your health care provider if problems continue. HOME CARE INSTRUCTIONS It is not possible to completely avoid allergens, but you can reduce your symptoms by taking steps to limit your exposure to them. It helps to know  exactly what you are allergic to so that you can avoid your specific triggers. SEEK MEDICAL CARE IF:   You have a fever.  You develop a cough that does not stop easily (persistent).  You have shortness of breath.  You start wheezing.  Symptoms interfere with normal daily activities. Document Released: 04/22/2001 Document Revised: 08/02/2013 Document Reviewed: 04/04/2013 Exit[employer] Patient Information 2015 [employer]. This information is not intended to replace advice given to you by your health care provider. Make sure you discuss any questions you have with your health care provider.  

## 2015-08-15 ENCOUNTER — Encounter (HOSPITAL_COMMUNITY): Payer: Self-pay | Admitting: Emergency Medicine

## 2015-08-15 ENCOUNTER — Emergency Department (HOSPITAL_COMMUNITY)
Admission: EM | Admit: 2015-08-15 | Discharge: 2015-08-15 | Disposition: A | Discharge status: Home / Self Care | Payer: Medicaid Other | Attending: Emergency Medicine | Admitting: Emergency Medicine

## 2015-08-15 DIAGNOSIS — R1010 Upper abdominal pain, unspecified: Secondary | ICD-10-CM | POA: Insufficient documentation

## 2015-08-15 DIAGNOSIS — R1030 Lower abdominal pain, unspecified: Secondary | ICD-10-CM | POA: Insufficient documentation

## 2015-08-15 DIAGNOSIS — J029 Acute pharyngitis, unspecified: Secondary | ICD-10-CM | POA: Diagnosis not present

## 2015-08-15 DIAGNOSIS — F17200 Nicotine dependence, unspecified, uncomplicated: Secondary | ICD-10-CM | POA: Diagnosis not present

## 2015-08-15 DIAGNOSIS — R05 Cough: Secondary | ICD-10-CM | POA: Diagnosis not present

## 2015-08-15 DIAGNOSIS — Z79899 Other long term (current) drug therapy: Secondary | ICD-10-CM | POA: Insufficient documentation

## 2015-08-15 DIAGNOSIS — Z792 Long term (current) use of antibiotics: Secondary | ICD-10-CM | POA: Insufficient documentation

## 2015-08-15 DIAGNOSIS — R55 Syncope and collapse: Secondary | ICD-10-CM | POA: Diagnosis not present

## 2015-08-15 DIAGNOSIS — Z7951 Long term (current) use of inhaled steroids: Secondary | ICD-10-CM | POA: Diagnosis not present

## 2015-08-15 DIAGNOSIS — Z3202 Encounter for pregnancy test, result negative: Secondary | ICD-10-CM | POA: Insufficient documentation

## 2015-08-15 DIAGNOSIS — Z791 Long term (current) use of non-steroidal anti-inflammatories (NSAID): Secondary | ICD-10-CM | POA: Insufficient documentation

## 2015-08-15 LAB — CBC
HCT: 40.7 % (ref 36.0–46.0)
Hemoglobin: 14.1 g/dL (ref 12.0–15.0)
MCH: 28 pg (ref 26.0–34.0)
MCHC: 34.6 g/dL (ref 30.0–36.0)
MCV: 80.8 fL (ref 78.0–100.0)
PLATELETS: 329 10*3/uL (ref 150–400)
RBC: 5.04 MIL/uL (ref 3.87–5.11)
RDW: 13.6 % (ref 11.5–15.5)
WBC: 8.9 10*3/uL (ref 4.0–10.5)

## 2015-08-15 LAB — URINALYSIS, ROUTINE W REFLEX MICROSCOPIC
BILIRUBIN URINE: NEGATIVE
Glucose, UA: NEGATIVE mg/dL
Ketones, ur: NEGATIVE mg/dL
Leukocytes, UA: NEGATIVE
Nitrite: NEGATIVE
PROTEIN: NEGATIVE mg/dL
Specific Gravity, Urine: 1.031 — ABNORMAL HIGH (ref 1.005–1.030)
pH: 5.5 (ref 5.0–8.0)

## 2015-08-15 LAB — BASIC METABOLIC PANEL
Anion gap: 8 (ref 5–15)
BUN: 10 mg/dL (ref 6–20)
CALCIUM: 8.9 mg/dL (ref 8.9–10.3)
CO2: 26 mmol/L (ref 22–32)
CREATININE: 0.78 mg/dL (ref 0.44–1.00)
Chloride: 106 mmol/L (ref 101–111)
GFR calc non Af Amer: >60 mL/min (ref >60)
GLUCOSE: 120 mg/dL — AB (ref 65–99)
Potassium: 4.2 mmol/L (ref 3.5–5.1)
Sodium: 140 mmol/L (ref 135–145)

## 2015-08-15 LAB — CBG MONITORING, ED: GLUCOSE-CAPILLARY: 106 mg/dL — AB (ref 65–99)

## 2015-08-15 LAB — URINE MICROSCOPIC-ADD ON

## 2015-08-15 LAB — POC URINE PREG, ED: Preg Test, Ur: NEGATIVE

## 2015-08-15 NOTE — ED Notes (Signed)
Pt alert and oriented x4. Respirations even and unlabored, bilateral symmetrical rise and fall of chest. Skin warm and dry. In no acute distress. Denies needs.   

## 2015-08-15 NOTE — Discharge Instructions (Signed)
[facility]ope [facility] today to get a primary care doctor. Return if you feel worse for any reason or contact your new doctor Near-syncope (commonly known as near fainting) is sudden weakness, dizziness, or feeling like you might pass out. During an episode of near-syncope, you may also develop pale skin, have tunnel vision, or feel sick to your stomach (nauseous). Near-syncope may occur when getting up after sitting or while standing for a long time. It is caused by a sudden decrease in blood flow to the brain. This decrease can result from various causes or triggers, most of which are not serious. However, because near-syncope can sometimes be a sign of something serious, a medical evaluation is required. The specific cause is often not determined. HOME CARE INSTRUCTIONS  Monitor your condition for any changes. The following actions may help to alleviate any discomfort you are experiencing:  Have someone stay with you until you feel stable.  Lie down right away and prop your feet up if you start feeling like you might faint. Breathe deeply and steadily. Wait until all the symptoms have passed. Most of these episodes last only a few minutes. You may feel tired for several hours.   Drink enough fluids to keep your urine clear or pale yellow.   If you are taking blood pressure or heart medicine, get up slowly when seated or lying down. Take several minutes to sit and then stand. This can reduce dizziness.  Follow up with your health care provider as directed. SEEK IMMEDIATE MEDICAL CARE IF:   You have a severe headache.   You have unusual pain in the chest, abdomen, or back.   You are bleeding from the mouth or rectum, or you have black or tarry stool.   You have an irregular or very fast heartbeat.   You have repeated fainting or have seizure-like jerking during an episode.   You faint when sitting or lying down.   You have confusion.    You have difficulty walking.   You have severe weakness.   You have vision problems.  MAKE SURE YOU:   Understand these instructions.  Will watch your condition.  Will get help right away if you are not doing well or get worse.   This information is not intended to replace advice given to you by your health care provider. Make sure you discuss any questions you have with your health care provider.   Document Released: 07/28/2005 Document Revised: 08/02/2013 Document Reviewed: 12/31/2012 Elsevier Interactive Patient Education [employer]2016 [employer]

## 2015-08-15 NOTE — ED Notes (Signed)
Bed: WA20 Expected date:  Expected time:  Means of arrival:  Comments: 

## 2015-08-15 NOTE — ED Notes (Signed)
Pt c/o LLQ pain, sore throat x 2 days and lightheadness this morning.  States she was getting her daughter ready for school and she passed out.  Denies hitting head.

## 2015-08-15 NOTE — ED Provider Notes (Signed)
CSN: [id number]     Arrival date & time 08/15/15  0809 History   First MD Initiated Contact with Patient 08/15/15 [phone]     Chief Complaint  Patient presents with  . Abdominal Pain  . Loss of Consciousness   Chief complaint "I passed out"  (Consider location/radiation/quality/duration/timing/severity/associated sxs/prior Treatment) HPI Patient has been feeling ill for 3 days with slight cough, sore throat and intermittent abdominal pain and bilateral flank pain. Abdominal pain and flank pain moves around to different places. Sometimes at suprapubic area and sometimes at upper abdomen. Presently she denies any abdominal pain and complains of slight right flank pain. She denies any nausea or vomiting other associated symptoms include slight cough. She denies fever. Pain is minimal at present. No treatment prior to coming here. She presents today as she became generally weak and had near syncopal event while getting her daughter ready for school this morning. She is not lightheaded at present. She denies any headache. No other associated symptoms. She describes pain at right flank as 2 on a scale of 1-10 at present. History reviewed. No pertinent past medical history. past medical history negative History reviewed. No pertinent past surgical history. Family History  Problem Relation Age of Onset  . Hypertension Other    Social History  Substance Use Topics  . Smoking status: Current Some Day Smoker  . Smokeless tobacco: None  . Alcohol Use: No   OB History    No data available     Review of Systems  Constitutional: Negative.   HENT: Positive for sore throat.   Respiratory: Positive for cough.   Cardiovascular: Negative.   Gastrointestinal: Positive for abdominal pain.  Genitourinary: Positive for flank pain.  Musculoskeletal: Negative.   Skin: Negative.   Neurological: Negative.   Psychiatric/Behavioral: Negative.   All other systems reviewed and are negative.     Allergies   Review of patient's allergies indicates no known allergies.  Home Medications   Prior to Admission medications   Medication Sig Start Date End Date Taking? Authorizing Provider  acetaminophen (TYLENOL) 500 MG tablet Take 1,000 mg by mouth every 6 (six) hours as needed for mild pain or headache.    Historical Provider, MD  amoxicillin (AMOXIL) 875 MG tablet Take 875 mg by mouth 2 (two) times daily.    Historical Provider, MD  benzonatate (TESSALON) 100 MG capsule Take 1 capsule (100 mg total) by mouth every 8 (eight) hours. 05/03/15   Everlene FarrierWilliam Dansie, PA-C  cetirizine (ZYRTEC ALLERGY) 10 MG tablet Take 1 tablet (10 mg total) by mouth daily. 05/03/15   Everlene FarrierWilliam Dansie, PA-C  diazepam (VALIUM) 5 MG tablet Take 1 tablet (5 mg total) by mouth every 8 (eight) hours as needed for muscle spasms. Patient not taking: Reported on 05/03/2015 09/07/14   Mirian MoMatthew Gentry, MD  diphenhydrAMINE (SOMINEX) 25 MG tablet Take 75 mg by mouth daily as needed for itching, allergies or sleep.    Historical Provider, MD  fluticasone (FLONASE) 50 MCG/ACT nasal spray Place 2 sprays into both nostrils daily. 05/03/15   Everlene FarrierWilliam Dansie, PA-C  naproxen (NAPROSYN) 250 MG tablet Take 1 tablet (250 mg total) by mouth 2 (two) times daily with a meal. 05/03/15   Everlene FarrierWilliam Dansie, PA-C   BP 134/94 mmHg  Pulse 74  Temp(Src) 98.8 F (37.1 C) (Oral)  Resp 16  SpO2 100%  LMP 07/15/2015 (Approximate) Physical Exam  Constitutional: She is oriented to person, place, and time. She appears well-developed and well-nourished. No distress.  HENT:  Head: Normocephalic and atraumatic.  Right Ear: External ear normal.  Left Ear: External ear normal.  Nose: Nose normal.  Oropharynx reddened. No exudate. Uvula midline. Tonsils large  Eyes: Conjunctivae are normal. Pupils are equal, round, and reactive to light.  Neck: Neck supple. No tracheal deviation present. No thyromegaly present.  Cardiovascular: Normal rate and regular rhythm.   No murmur  heard. Pulmonary/Chest: Effort normal and breath sounds normal.  Abdominal: Soft. Bowel sounds are normal. She exhibits no distension. There is no tenderness.  Obese  Musculoskeletal: Normal range of motion. She exhibits no edema or tenderness.  Neurological: She is alert and oriented to person, place, and time. No cranial nerve deficit. Coordination normal.  Gait normal not lightheaded on standing  Skin: Skin is warm and dry. No rash noted.  Psychiatric: She has a normal mood and affect.  Nursing note and vitals reviewed.   ED Course  Procedures (including critical care time) Labs Review Labs Reviewed  CBG MONITORING, ED - Abnormal; Notable for the following:    Glucose-Capillary 106 (*)    All other components within normal limits  CBC  BASIC METABOLIC PANEL  URINALYSIS, ROUTINE W REFLEX MICROSCOPIC (NOT AT [facility])  POC URINE PREG, ED    Imaging Review No results found. I have personally reviewed and evaluated these images and lab results as part of my medical decision-making.   EKG Interpretation   Date/Time:  Wednesday August 15 2015 08:18:09 EST Ventricular Rate:  74 PR Interval:  120 QRS Duration: 96 QT Interval:  375 QTC Calculation: 416 R Axis:   43 Text Interpretation:  Sinus rhythm No significant change since last  tracing Confirmed by Lakyn Alsteen  MD, Amel Kitch ([id number]) on 08/15/2015 8:23:33 AM     11:20 AM patient alert asymptomatic appears comfortable. Denies complaint Results for orders placed or performed during the hospital encounter of 08/15/15  Basic metabolic panel  Result Value Ref Range   Sodium 140 135 - 145 mmol/L   Potassium 4.2 3.5 - 5.1 mmol/L   Chloride 106 101 - 111 mmol/L   CO2 26 22 - 32 mmol/L   Glucose, Bld 120 (H) 65 - 99 mg/dL   BUN 10 6 - 20 mg/dL   Creatinine, Ser 1.61 0.44 - 1.00 mg/dL   Calcium 8.9 8.9 - 09.6 mg/dL   GFR calc non Af Amer >60 >60 mL/min   GFR calc Af Amer >60 >60 mL/min   Anion gap 8 5 - 15  CBC  Result Value Ref  Range   WBC 8.9 4.0 - 10.5 K/uL   RBC 5.04 3.87 - 5.11 MIL/uL   Hemoglobin 14.1 12.0 - 15.0 g/dL   HCT 04.5 40.9 - 81.1 %   MCV 80.8 78.0 - 100.0 fL   MCH 28.0 26.0 - 34.0 pg   MCHC 34.6 30.0 - 36.0 g/dL   RDW 91.4 78.2 - 95.6 %   Platelets 329 150 - 400 K/uL  Urinalysis, Routine w reflex microscopic (not at [facility])  Result Value Ref Range   Color, Urine YELLOW YELLOW   APPearance CLOUDY (A) CLEAR   Specific Gravity, Urine 1.031 (H) 1.005 - 1.030   pH 5.5 5.0 - 8.0   Glucose, UA NEGATIVE NEGATIVE mg/dL   Hgb urine dipstick TRACE (A) NEGATIVE   Bilirubin Urine NEGATIVE NEGATIVE   Ketones, ur NEGATIVE NEGATIVE mg/dL   Protein, ur NEGATIVE NEGATIVE mg/dL   Nitrite NEGATIVE NEGATIVE   Leukocytes, UA NEGATIVE NEGATIVE  Urine microscopic-add on  Result Value  Ref Range   Squamous Epithelial / LPF 6-30 (A) NONE SEEN   WBC, UA 0-5 0 - 5 WBC/hpf   RBC / HPF 0-5 0 - 5 RBC/hpf   Bacteria, UA FEW (A) NONE SEEN   Urine-Other MUCOUS PRESENT   CBG monitoring, ED  Result Value Ref Range   Glucose-Capillary 106 (H) 65 - 99 mg/dL  POC urine preg, ED (not at [facility])  Result Value Ref Range   Preg Test, Ur NEGATIVE NEGATIVE   No results found.  MDM  Suspect the patient has viral illness with cough sore throat, thick complaints. Near syncope likely secondary to vasovagal reaction Final diagnoses:  None   plan referral [place] [facility] Diagnoses near syncope      Doug Sou, MD 08/15/15 1122

## 2015-08-15 NOTE — ED Notes (Signed)
Pt not wanting an IV if it can be avoided.

## 2016-02-22 ENCOUNTER — Emergency Department (HOSPITAL_COMMUNITY)
Admission: EM | Admit: 2016-02-22 | Discharge: 2016-02-22 | Disposition: A | Discharge status: Home / Self Care | Payer: Managed Care, Other (non HMO) | Attending: Emergency Medicine | Admitting: Emergency Medicine

## 2016-02-22 ENCOUNTER — Encounter (HOSPITAL_COMMUNITY): Payer: Self-pay

## 2016-02-22 DIAGNOSIS — Z7951 Long term (current) use of inhaled steroids: Secondary | ICD-10-CM | POA: Insufficient documentation

## 2016-02-22 DIAGNOSIS — Z79899 Other long term (current) drug therapy: Secondary | ICD-10-CM | POA: Diagnosis not present

## 2016-02-22 DIAGNOSIS — F17200 Nicotine dependence, unspecified, uncomplicated: Secondary | ICD-10-CM | POA: Insufficient documentation

## 2016-02-22 DIAGNOSIS — J0100 Acute maxillary sinusitis, unspecified: Secondary | ICD-10-CM | POA: Diagnosis not present

## 2016-02-22 DIAGNOSIS — R0981 Nasal congestion: Secondary | ICD-10-CM | POA: Diagnosis present

## 2016-02-22 DIAGNOSIS — Z791 Long term (current) use of non-steroidal anti-inflammatories (NSAID): Secondary | ICD-10-CM | POA: Diagnosis not present

## 2016-02-22 MED ORDER — AMOXICILLIN-POT CLAVULANATE 875-125 MG PO TABS: 1.0000 | ORAL_TABLET | Freq: Two times a day (BID) | ORAL | Status: DC

## 2016-02-22 MED ORDER — BENZONATATE 100 MG PO CAPS: 100.0000 mg | ORAL_CAPSULE | Freq: Three times a day (TID) | ORAL | Status: DC

## 2016-02-22 NOTE — ED Notes (Signed)
Patient d/c'd self care.  F/U and medications discussed.  Patient verbalized understanding. 

## 2016-02-22 NOTE — ED Notes (Signed)
Pt with nasal congestion and sore throat x 1 week.l  No fever

## 2016-02-22 NOTE — Discharge Instructions (Signed)
You have been seen today for a suspected sinus infection. Please take all of your antibiotics until finished!   You may develop abdominal discomfort or diarrhea from the antibiotic.  You may help offset this with probiotics which you can buy or get in yogurt. Do not eat or take the probiotics until 2 hours after your antibiotic. Drink plenty of fluids and get plenty of rest. You should be drinking at least a liter of water an hour to stay hydrated. Ibuprofen, Naproxen, or Tylenol for pain or fever. Tessalon for cough. Plain Mucinex may help relieve congestion. Warm liquids or Chloraseptic spray may help soothe the sore throat. Follow up with PCP as needed. Return to ED should symptoms worsen.

## 2016-02-22 NOTE — ED Provider Notes (Signed)
CSN: [id number]     Arrival date & time 02/22/16  1821 History  By signing my name below, I, Jasmyn B. Alexander, attest that this documentation has been prepared under the direction and in the presence of Imanuel Pruiett, PA-C.  Electronically Signed: Gillis EndsJasmyn B. Lyn HollingsheadAlexander, ED Scribe. 02/22/2016. 7:20 PM.   Chief Complaint  Patient presents with  . Nasal Congestion   The history is provided by the patient. No language interpreter was used.    HPI Comments: Kelly Crosby is a 25 y.o. female who presents to the Emergency Department complaining of constant, nasal congestion x 1 week. States she thought that she was getting better, but then became much worse with increased sinus pain. Pt has associated sinus pressure, dry cough, ear pain and sore throat. She has used Dayquil, Nyquil, and cough drops with no relief of symptoms. There are no alleviating factors. Denies any fever/chills, N/V, shortness of breath, or any other complaints.  History reviewed. No pertinent past medical history. History reviewed. No pertinent past surgical history. Family History  Problem Relation Age of Onset  . Hypertension Other    Social History  Substance Use Topics  . Smoking status: Current Some Day Smoker  . Smokeless tobacco: None  . Alcohol Use: No   OB History    No data available     Review of Systems  Constitutional: Negative for fever and chills.  HENT: Positive for congestion, ear pain, sinus pressure and sore throat.   Respiratory: Positive for cough. Negative for shortness of breath.   Cardiovascular: Negative for chest pain.  Gastrointestinal: Negative for nausea and vomiting.  All other systems reviewed and are negative.  Allergies  Review of patient's allergies indicates no known allergies.  Home Medications   Prior to Admission medications   Medication Sig Start Date End Date Taking? Authorizing Provider  amoxicillin-clavulanate (AUGMENTIN) 875-125 MG tablet Take 1 tablet by mouth every  12 (twelve) hours. 02/22/16   Tashanti Dalporto C Jenese Mischke, PA-C  benzonatate (TESSALON) 100 MG capsule Take 1 capsule (100 mg total) by mouth every 8 (eight) hours. 02/22/16   Kaeleen Odom C Lenyx Boody, PA-C  carboxymethylcellulose (REFRESH PLUS) 0.5 % SOLN Place 1 drop into both eyes 3 (three) times daily as needed.    Historical Provider, MD  cetirizine (ZYRTEC ALLERGY) 10 MG tablet Take 1 tablet (10 mg total) by mouth daily. Patient not taking: Reported on 08/15/2015 05/03/15   Everlene FarrierWilliam Dansie, PA-C  diazepam (VALIUM) 5 MG tablet Take 1 tablet (5 mg total) by mouth every 8 (eight) hours as needed for muscle spasms. Patient not taking: Reported on 05/03/2015 09/07/14   Mirian MoMatthew Gentry, MD  fluticasone [facility](FLONASE) 50 MCG/ACT nasal spray Place 2 sprays into both nostrils daily. Patient not taking: Reported on 08/15/2015 05/03/15   Everlene FarrierWilliam Dansie, PA-C  Multiple Vitamin (MULTIVITAMIN WITH MINERALS) TABS tablet Take 1 tablet by mouth daily.    Historical Provider, MD  naproxen (NAPROSYN) 250 MG tablet Take 1 tablet (250 mg total) by mouth 2 (two) times daily with a meal. Patient not taking: Reported on 08/15/2015 05/03/15   Everlene FarrierWilliam Dansie, PA-C   BP 121/86 mmHg  Pulse 74  Temp(Src) 98.5 F (36.9 C) (Oral)  Resp 20  SpO2 97%  LMP 02/15/2016 Physical Exam  Constitutional: She appears well-developed and well-nourished. No distress.  HENT:  Head: Normocephalic and atraumatic.  Right Ear: Tympanic membrane, external ear and ear canal normal.  Left Ear: Tympanic membrane, external ear and ear canal normal.  Mouth/Throat: Oropharynx is  clear and moist.  Tenderness to maxillary sinuses.  Eyes: Conjunctivae are normal.  Neck: Normal range of motion. Neck supple.  Cardiovascular: Normal rate and regular rhythm.   Pulmonary/Chest: Effort normal and breath sounds normal. No respiratory distress. She has no wheezes. She has no rales.  Lymphadenopathy:    She has no cervical adenopathy.  Neurological: She is alert.  Skin: Skin is warm and dry.  She is not diaphoretic.  Psychiatric: She has a normal mood and affect. Her behavior is normal.  Nursing note and vitals reviewed.   ED Course  Procedures (including critical care time) DIAGNOSTIC STUDIES: Oxygen Saturation is 97% on RA, normal by my interpretation.    COORDINATION OF CARE: 7:21 PM-Discussed treatment plan which includes order of Tessalon and Augmentin with pt at bedside and pt agreed to plan.   MDM   Final diagnoses:  Acute maxillary sinusitis, recurrence not specified    Kelly Crosby presents with sinus pressure, sinus pain, dry cough, and congestion for little over a week.  Suspect sinusitis versus URI. Antibiotics due to report of second sickening. The patient was given instructions for home care as well as return precautions. Patient voices understanding of these instructions, accepts the plan, and is comfortable with discharge.  I personally performed the services described in this documentation, which was scribed in my presence. The recorded information has been reviewed and is accurate.   Anselm Pancoast, PA-C 02/23/16 2029  Raeford Razor, MD 02/25/16 2004

## 2016-02-22 NOTE — ED Notes (Signed)
Pt from home with complaints of congestion, dry cough, sinus pressure, and ear pain that began last Friday. Pt states she has taken OTC dayquil, nyquil, and cough drops with no relief. Pt has clear lung sounds. Pt denies fever or chill at home

## 2016-09-26 ENCOUNTER — Encounter (HOSPITAL_COMMUNITY): Payer: Self-pay

## 2016-09-26 ENCOUNTER — Inpatient Hospital Stay (HOSPITAL_COMMUNITY)
Admission: AD | Admit: 2016-09-26 | Discharge: 2016-09-30 | DRG: 897 | Disposition: A | Discharge status: Home / Self Care | Payer: Medicaid Other | Attending: Psychiatry | Admitting: Psychiatry

## 2016-09-26 ENCOUNTER — Emergency Department (HOSPITAL_COMMUNITY)
Admission: EM | Admit: 2016-09-26 | Discharge: 2016-09-26 | Disposition: A | Discharge status: Other Acute Inpatient Hospital | Payer: Medicaid Other | Attending: Emergency Medicine | Admitting: Emergency Medicine

## 2016-09-26 DIAGNOSIS — Z8249 Family history of ischemic heart disease and other diseases of the circulatory system: Secondary | ICD-10-CM | POA: Diagnosis not present

## 2016-09-26 DIAGNOSIS — F17210 Nicotine dependence, cigarettes, uncomplicated: Secondary | ICD-10-CM | POA: Diagnosis not present

## 2016-09-26 DIAGNOSIS — E876 Hypokalemia: Secondary | ICD-10-CM

## 2016-09-26 DIAGNOSIS — F17290 Nicotine dependence, other tobacco product, uncomplicated: Secondary | ICD-10-CM | POA: Diagnosis present

## 2016-09-26 DIAGNOSIS — F12259 Cannabis dependence with psychotic disorder, unspecified: Principal | ICD-10-CM | POA: Diagnosis present

## 2016-09-26 DIAGNOSIS — F19151 Other psychoactive substance abuse with psychoactive substance-induced psychotic disorder with hallucinations: Secondary | ICD-10-CM | POA: Insufficient documentation

## 2016-09-26 DIAGNOSIS — S00511A Abrasion of lip, initial encounter: Secondary | ICD-10-CM | POA: Diagnosis not present

## 2016-09-26 DIAGNOSIS — F19951 Other psychoactive substance use, unspecified with psychoactive substance-induced psychotic disorder with hallucinations: Secondary | ICD-10-CM

## 2016-09-26 DIAGNOSIS — Y939 Activity, unspecified: Secondary | ICD-10-CM | POA: Diagnosis not present

## 2016-09-26 DIAGNOSIS — Z7951 Long term (current) use of inhaled steroids: Secondary | ICD-10-CM | POA: Diagnosis not present

## 2016-09-26 DIAGNOSIS — F259 Schizoaffective disorder, unspecified: Secondary | ICD-10-CM | POA: Diagnosis present

## 2016-09-26 DIAGNOSIS — Y999 Unspecified external cause status: Secondary | ICD-10-CM | POA: Insufficient documentation

## 2016-09-26 DIAGNOSIS — F23 Brief psychotic disorder: Secondary | ICD-10-CM

## 2016-09-26 DIAGNOSIS — Y929 Unspecified place or not applicable: Secondary | ICD-10-CM | POA: Diagnosis not present

## 2016-09-26 DIAGNOSIS — Z23 Encounter for immunization: Secondary | ICD-10-CM | POA: Insufficient documentation

## 2016-09-26 DIAGNOSIS — Z888 Allergy status to other drugs, medicaments and biological substances status: Secondary | ICD-10-CM | POA: Diagnosis not present

## 2016-09-26 DIAGNOSIS — F1220 Cannabis dependence, uncomplicated: Secondary | ICD-10-CM | POA: Diagnosis present

## 2016-09-26 DIAGNOSIS — X780XXA Intentional self-harm by sharp glass, initial encounter: Secondary | ICD-10-CM | POA: Diagnosis not present

## 2016-09-26 DIAGNOSIS — Z880 Allergy status to penicillin: Secondary | ICD-10-CM

## 2016-09-26 DIAGNOSIS — Z79899 Other long term (current) drug therapy: Secondary | ICD-10-CM

## 2016-09-26 DIAGNOSIS — F1210 Cannabis abuse, uncomplicated: Secondary | ICD-10-CM

## 2016-09-26 DIAGNOSIS — F19929 Other psychoactive substance use, unspecified with intoxication, unspecified: Secondary | ICD-10-CM | POA: Diagnosis present

## 2016-09-26 DIAGNOSIS — F19251 Other psychoactive substance dependence with psychoactive substance-induced psychotic disorder with hallucinations: Secondary | ICD-10-CM | POA: Diagnosis not present

## 2016-09-26 DIAGNOSIS — S61412A Laceration without foreign body of left hand, initial encounter: Secondary | ICD-10-CM | POA: Diagnosis present

## 2016-09-26 DIAGNOSIS — Z5181 Encounter for therapeutic drug level monitoring: Secondary | ICD-10-CM | POA: Diagnosis not present

## 2016-09-26 DIAGNOSIS — R443 Hallucinations, unspecified: Secondary | ICD-10-CM | POA: Diagnosis present

## 2016-09-26 DIAGNOSIS — F28 Other psychotic disorder not due to a substance or known physiological condition: Secondary | ICD-10-CM

## 2016-09-26 DIAGNOSIS — S61419A Laceration without foreign body of unspecified hand, initial encounter: Secondary | ICD-10-CM

## 2016-09-26 HISTORY — DX: Anxiety disorder, unspecified: F41.9

## 2016-09-26 HISTORY — DX: Depression, unspecified: F32.A

## 2016-09-26 HISTORY — DX: Major depressive disorder, single episode, unspecified: F32.9

## 2016-09-26 LAB — CBC WITH DIFFERENTIAL/PLATELET
Basophils Absolute: 0 10*3/uL (ref 0.0–0.1)
Basophils Relative: 0 %
EOS ABS: 0 10*3/uL (ref 0.0–0.7)
EOS PCT: 0 %
HCT: 35.9 % — ABNORMAL LOW (ref 36.0–46.0)
Hemoglobin: 12.8 g/dL (ref 12.0–15.0)
LYMPHS ABS: 1.1 10*3/uL (ref 0.7–4.0)
Lymphocytes Relative: 7 %
MCH: 28.1 pg (ref 26.0–34.0)
MCHC: 35.7 g/dL (ref 30.0–36.0)
MCV: 78.7 fL (ref 78.0–100.0)
MONO ABS: 0.6 10*3/uL (ref 0.1–1.0)
Monocytes Relative: 4 %
Neutro Abs: 14.2 10*3/uL — ABNORMAL HIGH (ref 1.7–7.7)
Neutrophils Relative %: 89 %
PLATELETS: 299 10*3/uL (ref 150–400)
RBC: 4.56 MIL/uL (ref 3.87–5.11)
RDW: 13.4 % (ref 11.5–15.5)
WBC: 15.9 10*3/uL — AB (ref 4.0–10.5)

## 2016-09-26 LAB — RAPID URINE DRUG SCREEN, HOSP PERFORMED
AMPHETAMINES: NOT DETECTED
BARBITURATES: NOT DETECTED
BENZODIAZEPINES: POSITIVE — AB
COCAINE: NOT DETECTED
OPIATES: NOT DETECTED
TETRAHYDROCANNABINOL: POSITIVE — AB

## 2016-09-26 LAB — URINALYSIS, ROUTINE W REFLEX MICROSCOPIC
Bilirubin Urine: NEGATIVE
GLUCOSE, UA: NEGATIVE mg/dL
Ketones, ur: NEGATIVE mg/dL
LEUKOCYTES UA: NEGATIVE
Nitrite: NEGATIVE
Protein, ur: 30 mg/dL — AB
SPECIFIC GRAVITY, URINE: 1.006 (ref 1.005–1.030)
pH: 5 (ref 5.0–8.0)

## 2016-09-26 LAB — ACETAMINOPHEN LEVEL: Acetaminophen (Tylenol), Serum: <10 ug/mL — ABNORMAL LOW (ref 10–30)

## 2016-09-26 LAB — COMPREHENSIVE METABOLIC PANEL
ALT: 20 U/L (ref 14–54)
ANION GAP: 11 (ref 5–15)
AST: 29 U/L (ref 15–41)
Albumin: 4.1 g/dL (ref 3.5–5.0)
Alkaline Phosphatase: 51 U/L (ref 38–126)
BUN: 8 mg/dL (ref 6–20)
CHLORIDE: 111 mmol/L (ref 101–111)
CO2: 18 mmol/L — AB (ref 22–32)
Calcium: 9 mg/dL (ref 8.9–10.3)
Creatinine, Ser: 1.27 mg/dL — ABNORMAL HIGH (ref 0.44–1.00)
GFR calc non Af Amer: 58 mL/min — ABNORMAL LOW (ref >60)
Glucose, Bld: 208 mg/dL — ABNORMAL HIGH (ref 65–99)
POTASSIUM: 2.9 mmol/L — AB (ref 3.5–5.1)
SODIUM: 140 mmol/L (ref 135–145)
Total Bilirubin: 0.4 mg/dL (ref 0.3–1.2)
Total Protein: 7.3 g/dL (ref 6.5–8.1)

## 2016-09-26 LAB — PREGNANCY, URINE: Preg Test, Ur: NEGATIVE

## 2016-09-26 LAB — SALICYLATE LEVEL: Salicylate Lvl: <7 mg/dL (ref 2.8–30.0)

## 2016-09-26 LAB — ETHANOL: Alcohol, Ethyl (B): <5 mg/dL (ref <5)

## 2016-09-26 MED ORDER — POTASSIUM CHLORIDE CRYS ER 20 MEQ PO TBCR
20.0000 meq | EXTENDED_RELEASE_TABLET | Freq: Two times a day (BID) | ORAL | Status: AC
  Administered 2016-09-26 – 2016-09-29 (×7): 20 meq via ORAL
  Filled 2016-09-26 (×8): qty 1

## 2016-09-26 MED ORDER — ONDANSETRON HCL 4 MG PO TABS: 4.0000 mg | ORAL_TABLET | Freq: Three times a day (TID) | ORAL | Status: DC | PRN

## 2016-09-26 MED ORDER — ALUM & MAG HYDROXIDE-SIMETH 200-200-20 MG/5ML PO SUSP: 30.0000 mL | ORAL | Status: DC | PRN

## 2016-09-26 MED ORDER — STERILE WATER FOR INJECTION IJ SOLN
INTRAMUSCULAR | Status: AC
  Administered 2016-09-26: 1 mL
  Filled 2016-09-26: qty 10

## 2016-09-26 MED ORDER — SODIUM CHLORIDE 0.9 % IV SOLN
Freq: Once | INTRAVENOUS | Status: DC
  Filled 2016-09-26: qty 1000

## 2016-09-26 MED ORDER — ACETAMINOPHEN 325 MG PO TABS
650.0000 mg | ORAL_TABLET | ORAL | Status: DC | PRN
  Administered 2016-09-26 – 2016-09-28 (×3): 650 mg via ORAL
  Filled 2016-09-26 (×3): qty 2

## 2016-09-26 MED ORDER — POTASSIUM CHLORIDE CRYS ER 20 MEQ PO TBCR: 20.0000 meq | EXTENDED_RELEASE_TABLET | Freq: Two times a day (BID) | ORAL | Status: DC

## 2016-09-26 MED ORDER — LORAZEPAM 1 MG PO TABS: 1.0000 mg | ORAL_TABLET | Freq: Three times a day (TID) | ORAL | Status: DC | PRN

## 2016-09-26 MED ORDER — POTASSIUM CHLORIDE CRYS ER 20 MEQ PO TBCR
80.0000 meq | EXTENDED_RELEASE_TABLET | Freq: Once | ORAL | Status: AC
  Administered 2016-09-26: 80 meq via ORAL
  Filled 2016-09-26: qty 4

## 2016-09-26 MED ORDER — TETANUS-DIPHTH-ACELL PERTUSSIS 5-2.5-18.5 LF-MCG/0.5 IM SUSP
0.5000 mL | Freq: Once | INTRAMUSCULAR | Status: AC
  Administered 2016-09-26: 0.5 mL via INTRAMUSCULAR
  Filled 2016-09-26: qty 0.5

## 2016-09-26 MED ORDER — MAGNESIUM HYDROXIDE 400 MG/5ML PO SUSP: 30.0000 mL | Freq: Every day | ORAL | Status: DC | PRN

## 2016-09-26 MED ORDER — LORAZEPAM 1 MG PO TABS
1.0000 mg | ORAL_TABLET | Freq: Four times a day (QID) | ORAL | Status: DC | PRN
  Administered 2016-09-28 – 2016-09-29 (×3): 1 mg via ORAL
  Filled 2016-09-26 (×3): qty 1

## 2016-09-26 MED ORDER — IBUPROFEN 200 MG PO TABS: 600.0000 mg | ORAL_TABLET | Freq: Three times a day (TID) | ORAL | Status: DC | PRN

## 2016-09-26 MED ORDER — ZIPRASIDONE MESYLATE 20 MG IM SOLR
INTRAMUSCULAR | Status: AC
  Administered 2016-09-26: 20 mg via INTRAMUSCULAR
  Filled 2016-09-26: qty 20

## 2016-09-26 MED ORDER — SODIUM CHLORIDE 0.9 % IV BOLUS (SEPSIS)
1000.0000 mL | Freq: Once | INTRAVENOUS | Status: AC
  Administered 2016-09-26: 1000 mL via INTRAVENOUS

## 2016-09-26 MED ORDER — ZIPRASIDONE MESYLATE 20 MG IM SOLR
INTRAMUSCULAR | Status: AC
  Filled 2016-09-26: qty 20

## 2016-09-26 MED ORDER — ACETAMINOPHEN 325 MG PO TABS: 650.0000 mg | ORAL_TABLET | ORAL | Status: DC | PRN

## 2016-09-26 MED ORDER — LORAZEPAM 2 MG/ML IJ SOLN
2.0000 mg | Freq: Once | INTRAMUSCULAR | Status: AC
  Administered 2016-09-26: 2 mg via INTRAMUSCULAR
  Filled 2016-09-26: qty 1

## 2016-09-26 MED ORDER — SODIUM CHLORIDE 0.9 % IV SOLN
INTRAVENOUS | Status: DC
  Administered 2016-09-26: 07:00:00 via INTRAVENOUS

## 2016-09-26 MED ORDER — IBUPROFEN 600 MG PO TABS
600.0000 mg | ORAL_TABLET | Freq: Three times a day (TID) | ORAL | Status: DC | PRN
  Administered 2016-09-27 – 2016-09-28 (×3): 600 mg via ORAL
  Filled 2016-09-26 (×3): qty 1
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
## 2016-09-26 NOTE — Progress Notes (Signed)
09/26/16 1350:  LRT went to pt room to offer activities, pt was sleep.  Morgyn Marut, LRT/CTRS 

## 2016-09-26 NOTE — Progress Notes (Signed)
Patient ID: Kelly Crosby, female   DOB: 11/30/1990, 26 y.o.   MRN: [id number]  Writer introduced self to patient as patient's RN until 7 pm. Patient was cooperative in taking her scheduled Potassium. She appears watchful and apprehensive but polite. Patient was offered an ice pack for the are of her hand that had a cut and appeared swollen. Patient refused at this time.
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
## 2016-09-26 NOTE — BH Assessment (Signed)
[facility] Assessment Progress Note Case was staffed with Shaune PollackLord DNP who reccommended an inpatient admission.
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
## 2016-09-26 NOTE — Tx Team (Signed)
Initial Treatment Plan 09/26/2016 5:53 PM Kelly Sprenkle ZOX:[id number]    PATIENT STRESSORS: Financial difficulties Loss of job Substance abuse   PATIENT STRENGTHS: DentistCommunication skills General fund of knowledge Motivation for treatment/growth Supportive family/friends Work skills   PATIENT IDENTIFIED PROBLEMS: Anxiety  Panic attacks  Paranoia  Depression               DISCHARGE CRITERIA:  Improved stabilization in mood, thinking, and/or behavior Need for constant or close observation no longer present Verbal commitment to aftercare and medication compliance  PRELIMINARY DISCHARGE PLAN: Attend aftercare/continuing care group Outpatient therapy Return to previous living arrangement  PATIENT/FAMILY INVOLVEMENT: This treatment plan has been presented to and reviewed with the patient, Kelly Crosby.  The patient and family have been given the opportunity to ask questions and make suggestions.  Lauris PoagAndrea B Nochum Fenter, RN 09/26/2016, 5:53 PM

## 2016-09-26 NOTE — BH Assessment (Signed)
[facility] Assessment Progress Note This writer attempted to assess patient although patient was medicated earlier this date and was unable to participate in assessment. This Clinical research associatewriter will attempt to assess later this date.

## 2016-09-26 NOTE — ED Notes (Signed)
Pt oriented to room and unit.  Pt is calm and cooperative at this time, somewhat withdrawn.  Pt denies S/I, H/I, and AVH.  Pt denies having any recollection of what brought her to the hospital.  15 minute checks and video monitoring in place.
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
## 2016-09-26 NOTE — BH Assessment (Signed)
[facility] Assessment Progress Note Case was staffed with Lord DNP who reccommended an inpatient admission.       
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
## 2016-09-27 DIAGNOSIS — Z888 Allergy status to other drugs, medicaments and biological substances status: Secondary | ICD-10-CM

## 2016-09-27 MED ORDER — OLANZAPINE 5 MG PO TABS
5.0000 mg | ORAL_TABLET | Freq: Every day | ORAL | Status: DC
  Administered 2016-09-27 – 2016-09-29 (×3): 5 mg via ORAL
  Filled 2016-09-27 (×5): qty 1

## 2016-09-27 NOTE — Progress Notes (Signed)
D   Pt has been interactive on the milieu    She has appropriate interactions with others and is compliant with treatment    She requested medications to help her rest tonight and received same  Pt endorses some anxiety and sadness  A   Verbal support and encouragement given   Medications administered and effectiveness monitored   Q 15 min checks    R   Pt is presently safe

## 2016-09-27 NOTE — Progress Notes (Signed)
Adult Psychoeducational Group Note  Date:  09/27/2016 Time:  8:36 PM  Group Topic/Focus:  Wrap-Up Group:   The focus of this group is to help patients review their daily goal of treatment and discuss progress on daily workbooks.  Participation Level:  Active  Participation Quality:  Appropriate  Affect:  Appropriate  Cognitive:  Appropriate  Insight: Appropriate  Engagement in Group:  Engaged  Modes of Intervention:  Discussion  Additional Comments:  The patient expressed that she was concerned about discharge.The patient also said that she attended all groups.  Octavio Mannshigpen, Bona Hubbard Lee 09/27/2016, 8:36 PM

## 2016-09-27 NOTE — [facility] Group Notes (Signed)
Adult Therapy Group Note  Date:  09/27/2016  Time:   11:15AM-12:00PM  Group Topic/Focus: Unhealthy vs Healthy Coping Techniques  Building Self Esteem:    The focus of this group was to discuss healthy and unhealthy coping techniques and become aware of the differences between the two.   Participants were invited to share ideas about how to incorporate more healthy coping techniques.  Motivational Interviewing was used to highlight reasons for change and resistance to change.  Participation Level:  Active  Participation Quality:  Attentive   Affect:  Blunted  Cognitive:  Appropriate  Insight: Appropriate  Engagement in Group:  Engaged  Modes of Intervention:  Motivational Interviewing, Discussion and Support  Additional Comments:  The patient expressed that she "used to be violent and angry but now I'm peace-loving."  She was one of the main speakers in group, accepted others at face value and was a positive contributor.  She talked about a variety of personal sayings that she lives by, shared them with other group members.  Ambrose MantleMareida Grossman-Orr, LCSW 09/27/2016   1:11 PM

## 2016-09-27 NOTE — Progress Notes (Signed)
D:  Per pt self inventory pt reports sleeping good, appetite good, energy level normal, ability to pay attention good, rates depression at a 0 out of 10, hopelessness at a 0 out of 10, anxiety at a 0 out of 10, denies SI/HI/AVH, goal today: "myself, love from the inside and show peace on the outside", pleasant during interaction.     A:  Emotional support provided, Encouraged pt to continue with treatment plan and attend all group activities, q15 min checks maintained for safety.  R:  Pt is receptive, going to groups, pleasant and cooperative with staff and other patients on the unit.
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
## 2016-09-28 DIAGNOSIS — Z79899 Other long term (current) drug therapy: Secondary | ICD-10-CM

## 2016-09-28 DIAGNOSIS — F19251 Other psychoactive substance dependence with psychoactive substance-induced psychotic disorder with hallucinations: Secondary | ICD-10-CM

## 2016-09-28 DIAGNOSIS — F23 Brief psychotic disorder: Secondary | ICD-10-CM

## 2016-09-28 DIAGNOSIS — F17210 Nicotine dependence, cigarettes, uncomplicated: Secondary | ICD-10-CM

## 2016-09-28 DIAGNOSIS — F1220 Cannabis dependence, uncomplicated: Secondary | ICD-10-CM

## 2016-09-28 LAB — LIPID PANEL
CHOL/HDL RATIO: 4.5 ratio
Cholesterol: 134 mg/dL (ref 0–200)
HDL: 30 mg/dL — AB (ref >40)
LDL CALC: 82 mg/dL (ref 0–99)
Triglycerides: 110 mg/dL (ref <150)
VLDL: 22 mg/dL (ref 0–40)

## 2016-09-28 LAB — TSH: TSH: 1.479 u[IU]/mL (ref 0.350–4.500)

## 2016-09-28 MED ORDER — INFLUENZA VAC SPLIT QUAD 0.5 ML IM SUSY
0.5000 mL | PREFILLED_SYRINGE | INTRAMUSCULAR | Status: AC
  Administered 2016-09-29: 0.5 mL via INTRAMUSCULAR
  Filled 2016-09-28: qty 0.5

## 2016-09-28 MED ORDER — IBUPROFEN 800 MG PO TABS
800.0000 mg | ORAL_TABLET | Freq: Three times a day (TID) | ORAL | Status: DC | PRN
  Administered 2016-09-28 – 2016-09-30 (×5): 800 mg via ORAL
  Filled 2016-09-28 (×5): qty 1

## 2016-09-28 MED ORDER — PNEUMOCOCCAL VAC POLYVALENT 25 MCG/0.5ML IJ INJ
0.5000 mL | INJECTION | INTRAMUSCULAR | Status: AC
  Administered 2016-09-29: 0.5 mL via INTRAMUSCULAR

## 2016-09-28 NOTE — Progress Notes (Signed)
[facility]  09/28/2016 2:07 PM Kelly PitchSierra Crosby  MRN:  [id number] Subjective:  Patient reports " I am doing better than when I came in." patient reports concerns that her child will be taken away by CPS.  Objective: Kelly Crosby is awake, alert and oriented *3. Seen sitting in dayroom interacting with peers.  Denies suicidal or homicidal ideation. Denies auditory or visual hallucination and does not appear to be responding to internal stimuli. Patient interacts well with staff and others. Patient reports she is medication compliant without mediation side effects. Patient denies depression or depressive symptoms. Reports good appetite  and resting well. Patient is inquiring about discharge. Support, encouragement and reassurance was provided.   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 Acute psychosis [F23] 09/26/2016  . Cannabis use disorder, severe, dependence (HCC) [F12.20] 09/26/2016  . 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 Anxiety   . Depression     Past Surgical History:  Procedure Laterality Date  . NO PAST SURGERIES     Family History:  Family History  Problem Relation Age of Onset  . Hypertension Other    Family Psychiatric  History:  Social History:  History  Alcohol Use No     History  Drug Use  . Types: Marijuana    Social History   Social History  . Marital status: Single    Spouse name: N/A  . Number of children: N/A  . Years of education: N/A   Social History Main Topics  . Smoking status: Current Some Day Smoker    Types: Cigars  . Smokeless tobacco: Never Used  . Alcohol use No  . Drug use: Yes     Types: Marijuana  . Sexual activity: Yes    Birth control/ protection: Injection   Other Topics Concern  . None   Social History Narrative  . None   Additional Social History:                         Sleep: Fair  Appetite:  Fair  Current Medications: Current Facility-Administered Medications  Medication Dose Route Frequency Provider Last Rate Last Dose  . acetaminophen (TYLENOL) tablet 650 mg  650 mg Oral Q4H PRN Charm RingsJamison Y Lord, NP   650 mg at 09/28/16 1350  . alum & mag hydroxide-simeth (MAALOX/MYLANTA) 200-200-20 MG/5ML suspension 30 mL  30 mL Oral PRN Charm RingsJamison Y Lord, NP      . ibuprofen (ADVIL,MOTRIN) tablet 800 mg  800 mg Oral Q8H PRN Oneta Rackanika N Shahira Fiske, NP   800 mg at 09/28/16 1025  . LORazepam (ATIVAN) tablet 1 mg  1 mg Oral Q6H PRN Jackelyn PolingJason A Berry, NP   1 mg at 09/28/16 1350  . magnesium hydroxide (MILK OF MAGNESIA) suspension 30 mL  30 mL Oral Daily PRN Charm RingsJamison Y Lord, NP      . OLANZapine (ZYPREXA) tablet 5 mg  5 mg Oral QHS Oneta Rackanika N Steve Youngberg, NP   5 mg at 09/27/16 2116  . ondansetron (ZOFRAN) tablet 4 mg  4 mg Oral Q8H PRN Charm RingsJamison Y Lord, NP      . potassium chloride SA (K-DUR,KLOR-CON) CR tablet 20 mEq  20 mEq  Oral BID Charm Rings, NP   20 mEq at 09/28/16 1610    Lab Results:  Results for orders placed or performed during the hospital encounter of 09/26/16 (from the past 48 hour(s))  Lipid panel     Status: Abnormal   Collection Time: 09/28/16  6:18 AM  Result Value Ref Range   Cholesterol 134 0 - 200 mg/dL   Triglycerides 960 <454 mg/dL   HDL 30 (L) >09 mg/dL   Total CHOL/HDL Ratio 4.5 RATIO   VLDL 22 0 - 40 mg/dL   LDL Cholesterol 82 0 - 99 mg/dL    Comment:        Total Cholesterol/HDL:CHD Risk Coronary Heart Disease Risk Table                     Men   Women  1/2 Average Risk   3.4   3.3  Average Risk       5.0   4.4  2 X Average Risk   9.6   7.1  3 X Average Risk  23.4   11.0        Use the calculated Patient Ratio above and the CHD Risk  Table to determine the patient's CHD Risk.        ATP III CLASSIFICATION (LDL):  <100     mg/dL   Optimal  811-914  mg/dL   Near or Above                    Optimal  130-159  mg/dL   Borderline  782-956  mg/dL   High  >213     mg/dL   Very High Performed at [facility], [address] [place], [place] [address]   TSH     Status: None   Collection Time: 09/28/16  6:18 AM  Result Value Ref Range   TSH 1.479 0.350 - 4.500 uIU/mL    Comment: Performed by a 3rd Generation assay with a functional sensitivity of <=0.01 uIU/mL. Performed at [facility], [address], [place], [place] [address]     Blood Alcohol level:  Lab Results  Component Value Date   ETH <5 09/26/2016    Metabolic Disorder Labs: No results found for: HGBA1C, MPG No results found for: PROLACTIN Lab Results  Component Value Date   CHOL 134 09/28/2016   TRIG 110 09/28/2016   HDL 30 (L) 09/28/2016   CHOLHDL 4.5 09/28/2016   VLDL 22 09/28/2016   LDLCALC 82 09/28/2016    Physical Findings: AIMS: Facial and Oral Movements Muscles of Facial Expression: None, normal Lips and Perioral Area: None, normal Jaw: None, normal Tongue: None, normal,Extremity Movements Upper (arms, wrists, hands, fingers): None, normal Lower (legs, knees, ankles, toes): None, normal, Trunk Movements Neck, shoulders, hips: None, normal, Overall Severity Severity of abnormal movements (highest score from questions above): None, normal Incapacitation due to abnormal movements: None, normal Patient's awareness of abnormal movements (rate only patient's report): No Awareness, Dental Status Current problems with teeth and/or dentures?: No Does patient usually wear dentures?: No  CIWA:    COWS:     Musculoskeletal: Strength & Muscle Tone: within normal limits Gait & Station: normal Patient leans: N/A  Psychiatric Specialty Exam: Physical Exam  Nursing note and vitals reviewed. Constitutional: She is  oriented to person, place, and time. She appears well-developed.  Cardiovascular: Normal rate.   Neurological: She is alert and oriented to person, place, and time.  Psychiatric: She has a normal mood and affect. Her behavior is normal.    Review of Systems  Musculoskeletal:       Wrist pain  Psychiatric/Behavioral: Positive for depression. The patient is nervous/anxious.     Blood pressure 113/68, pulse 87, temperature (!) 94.9 F (34.9 C), temperature source Oral, resp. rate 18, height 5\' 6"  (1.676 m), weight 117.5 kg (259 lb), SpO2 98 %.Body mass index is 41.8 kg/m.  General Appearance: Casual  Eye Contact:  Fair  Speech:  Clear and Coherent  Volume:  Normal  Mood:  Anxious and Depressed  Affect:  Congruent  Thought Process:  Coherent  Orientation:  Full (Time, Place, and Person)  Thought Content:  Rumination  Suicidal Thoughts:  No  Homicidal Thoughts:  No  Memory:  Immediate;   Fair Recent;   Fair Remote;   Fair  Judgement:  Fair  Insight:  Present  Psychomotor Activity:  Restlessness  Concentration:  Concentration: Fair  Recall:  [employer] of Knowledge:  Fair  Language:  Fair  Akathisia:  No  Handed:  Right  AIMS (if indicated):     Assets:  Communication Skills Desire for Improvement Resilience Social Support  ADL's:  Intact  Cognition:  WNL  Sleep:  Number of Hours: 6.75     I agree with current treatment plan on 09/28/2016, Patient seen face-to-face for psychiatric evaluation follow-up, chart reviewed. Reviewed the information documented and agree with the treatment plan.  Treatment Plan Summary: Daily contact with patient to assess and evaluate symptoms and progress in treatment and Medication management   Orders placed: EKG, A1C, TSH, Lipid and Prolactin Continue with Trazodone 50 mg for insomnia Continue with Ativan 1 mg Po PRN start Zyprexa 5 mg QHS  for mood stabilization  Will continue to monitor vitals ,medication compliance and treatment side  effects while patient is here.  Reviewed labs: Glucose 101 elevated ,BAL -0, UDS -  thc and benzodizpines. CSW will start working on disposition.  Patient to participate in therapeutic milieu  Oneta Rack, NP 09/28/2016, 2:07 PM
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
## 2016-09-28 NOTE — BHH Group Notes (Signed)
B[facility] Group Notes:  (Clinical Social Work)  09/28/2016  11:00AM-12:00PM  Summary of Progress/Problems:  The main focus of today's process group was to listen to a variety of genres of music and to identify that different types of music provoke different responses.  The patient then was able to identify personally what was soothing for them, as well as energizing, as well as how patient can personally use this knowledge in sleep habits, with depression, and with other symptoms.  The patient expressed at the beginning of group the overall feeling of groggy/sleepy and also a little anxious, rating it a "4" out of 10.  At the end of group she said she was still a little sleepy, had no anxiety.  She danced, smiled and sang a lot throughout group.  Type of Therapy:  Music Therapy   Participation Level:  Active  Participation Quality:  Attentive and Sharing  Affect:  Appropriate  Cognitive:  Oriented  Insight:  Engaged  Engagement in Therapy:  Engaged  Modes of Intervention:   Activity, Exploration  Ambrose MantleMareida Grossman-Orr, LCSW 09/28/2016

## 2016-09-28 NOTE — Progress Notes (Signed)
Patient ID: Kelly Crosby, female   DOB: [birth year], 26 y.o.   MRN: [id number]    D: Pt has been very labile on the unit today. At times patient is flat and depressed, other times she is bright and appropriate laughing with peers and attempting to be their caregiver. Pt did complain of some anxiety, Ativan given. Pt reported that her depression was a 0, her hopelessness was a 0, and her anxiety was a 0. Pt reported that her goal was to work on pain management. Pt reported being negative SI/HI, no AH/VH noted. A: 15 min checks continued for patient safety. R: Pt safety maintained.

## 2016-09-28 NOTE — Progress Notes (Signed)
Adult Psychoeducational Group Note  Date:  09/28/2016 Time:  8:52 PM  Group Topic/Focus:  Wrap-Up Group:   The focus of this group is to help patients review their daily goal of treatment and discuss progress on daily workbooks.  Participation Level:  Active  Participation Quality:  Appropriate and Attentive  Affect:  Appropriate  Cognitive:  Appropriate  Insight: Appropriate  Engagement in Group:  Engaged  Modes of Intervention:  Discussion  Additional Comments:  Pt stated her goal for today was to work on becoming more positive. Pt stated she is also working on motivating herself for discharge. Pt stated one positive thing that happened today is that she got a visit from her ex-fiance. Pt was encouraged to make her needs known to staff.  Caswell CorwinOwen, Sheccid Lahmann C 09/28/2016, 8:52 PM

## 2016-09-28 NOTE — Progress Notes (Signed)
D   Pt has been interactive on the milieu    She has appropriate interactions with others and is compliant with treatment    She requested medications to help her rest tonight and received same  Pt endorses some anxiety and sadness   She is somatic and complains of not being able to go to sleep but she has had plenty of medications A   Verbal support and encouragement given   Medications administered and effectiveness monitored   Q 15 min checks   Encouraged pt to lay down to aid in falling to sleep R   Pt is presently safe

## 2016-09-28 NOTE — Social Work (Signed)
Referred to [facility], is [place] resident.  Anne Cunningham, LCSW Lead Clinical Social Worker Phone:  [phone]  

## 2016-09-29 LAB — PROLACTIN: Prolactin: 71.1 ng/mL — ABNORMAL HIGH (ref 4.8–23.3)

## 2016-09-29 LAB — HEMOGLOBIN A1C
Hgb A1c MFr Bld: 5.3 % (ref 4.8–5.6)
Mean Plasma Glucose: 105 mg/dL

## 2016-09-29 MED ORDER — HYDROXYZINE HCL 25 MG PO TABS: 25.0000 mg | ORAL_TABLET | Freq: Four times a day (QID) | ORAL | Status: DC | PRN

## 2016-09-29 NOTE — Progress Notes (Signed)
Recreation Therapy Notes  Date: 09/29/16 Time: 1000 Location: 500 Hall Dayroom  Group Topic: Wellness  Goal Area(s) Addresses:  Patient will define components of whole wellness. Patient will verbalize benefit of whole wellness.  Behavioral Response: Engaged  Intervention: Chair exercises, meditation  Activity: Event organiserChair Exercises, Meditation.  LRT introduced chair exercises as a way to get in some physical wellness and meditation as a way to exercise mental wellness.  LRT read script to allow patients to engage in both techniques. Patients were to follow along as LRT read from scripts to engage in both activities.  Education: Wellness, Building control surveyorDischarge Planning.   Education Outcome: Acknowledges education/In group clarification offered/Needs additional education.   Clinical Observations/Feedback: Pt stated wellness was having "peace".  Pt expressed she felt loose and great after the activities.  Pt also expressed that she does yoga, meditates and writes poetry as a means to better wellness.   Caroll RancherMarjette Clarion Mooneyhan, LRT/CTRS     Caroll RancherLindsay, Shera Laubach A 09/29/2016 12:40 PM

## 2016-09-29 NOTE — Progress Notes (Signed)
Patient denies SI, HI and AVH.  Patient has attended all groups and been compliant with medications.  Patient has had a bright affect and appropriately engaged Clinical research associatewriter in conversation.    Assess patient for safety, offer medications as prescribed, engage patient in 1:1 staff talks.   Patient able to contract for safety. Continue to monitor.

## 2016-09-29 NOTE — Progress Notes (Signed)
Recreation Therapy Notes  INPATIENT RECREATION THERAPY ASSESSMENT  Patient Details Name: Kelly Crosby MRN: [id number] DOB: 1990-12-03 Today's Date: 09/29/2016  Patient Stressors: Other (Comment) (Finances, moving, lost job)  Pt stated she was here involuntarily because she smoked a laced blount and was hearing/seeing things.  Coping Skills:   Avoidance, Exercise, Art/Dance, Talking, Music  Personal Challenges: Trusting Others  Leisure Interests (2+):  Music - Listen, Social - Family, Individual - Other (Comment) (Time for self)  Awareness of Community Resources:  Yes  Community Resources:  Other (Comment) (Walking Trails, Nature [place])  Current Use: Yes  Patient Strengths:  Confidence, boldness  Patient Identified Areas of Improvement:  Listening skills; consequences behind decisions I make   Current Recreation Participation:  Everyday  Patient Goal for Hospitalization:  "Be a better me when I leave"  [place]  [place]  Current [place] (including self-harm):  No  Current HI:  No  Consent to Intern Participation: N/A   Caroll RancherMarjette Hershall Benkert, LRT/CTRS  Caroll RancherLindsay, Harbour Nordmeyer A 09/29/2016, 3:09 PM

## 2016-09-29 NOTE — Tx Team (Signed)
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 acetaminophen (TYLENOL) tablet 650 mg  650 mg Oral Q4H PRN Patrecia Pour, NP   650 mg at 09/28/16 1350  . alum & mag hydroxide-simeth (MAALOX/MYLANTA) 200-200-20 MG/5ML suspension 30 mL  30 mL Oral PRN Patrecia Pour, NP      . ibuprofen (ADVIL,MOTRIN) tablet 800 mg  800 mg Oral Q8H PRN Derrill Center, NP   800 mg at 09/29/16 1001  . LORazepam (ATIVAN) tablet 1 mg  1 mg Oral Q6H PRN Rozetta Nunnery, NP   1 mg at 09/28/16 2109  . magnesium hydroxide (MILK OF MAGNESIA) suspension 30 mL  30 mL Oral Daily PRN Patrecia Pour, NP      . OLANZapine (ZYPREXA) tablet 5 mg  5 mg Oral QHS Derrill Center, NP   5 mg at 09/28/16 2124  . ondansetron (ZOFRAN) tablet 4 mg  4 mg Oral Q8H PRN Patrecia Pour, NP      . potassium chloride SA (K-DUR,KLOR-CON) CR tablet 20 mEq  20 mEq Oral BID Patrecia Pour, NP   20 mEq at 09/29/16 0803    PTA Medications: Prescriptions Prior to Admission  Medication Sig Dispense Refill Last Dose  . amoxicillin-clavulanate (AUGMENTIN) 875-125 MG tablet Take 1 tablet by mouth every 12 (twelve) hours. (Patient not taking: Reported on 09/26/2016) 10 tablet 0 Not Taking at Unknown time  . benzonatate (TESSALON) 100 MG capsule Take 1 capsule (100 mg total) by mouth every 8 (eight) hours. (Patient not taking: Reported on 09/26/2016) 21 capsule 0 Not Taking at Unknown time  . carboxymethylcellulose (REFRESH PLUS) 0.5 % SOLN Place 1 drop into both eyes 3  (three) times daily as needed.   Not Taking at Unknown time  . cetirizine (ZYRTEC ALLERGY) 10 MG tablet Take 1 tablet (10 mg total) by mouth daily. (Patient not taking: Reported on 08/15/2015) 30 tablet 1 Not Taking at Unknown time  . diazepam (VALIUM) 5 MG tablet Take 1 tablet (5 mg total) by mouth every 8 (eight) hours as needed for muscle spasms. (Patient not taking: Reported on 05/03/2015) 15 tablet 0 Not Taking at Unknown time  . fluticasone (FLONASE) 50 MCG/ACT nasal spray Place 2 sprays into both nostrils daily. (Patient not taking: Reported on 08/15/2015) 16 g 1 Not Taking at Unknown time  . Multiple Vitamin (MULTIVITAMIN WITH MINERALS) TABS tablet Take 1 tablet by mouth daily.   Past Week at Unknown time  . naproxen (NAPROSYN) 250 MG tablet Take 1 tablet (250 mg total) by mouth 2 (two) times daily with a meal. (Patient not taking: Reported on 08/15/2015) 30 tablet 0 Not Taking at Unknown time    Treatment Modalities: Medication Management, Group therapy, Case management,  1 to 1 session with clinician, Psychoeducation, Recreational therapy.   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  Therapeutic Interventions: 1 to 1 sessions, Unit Group sessions and Medication administration.  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[place]) Active Problems:   Acute psychosis   Long Term Goal(s): Improvement in symptoms so as ready for discharge  Short Term Goals: Ability to verbalize feelings will improve Ability to demonstrate self-control will improve Ability to maintain clinical measurements within normal limits will improve Ability to identify changes in lifestyle to reduce recurrence of condition will improve Ability to demonstrate self-control will improve Ability to identify triggers associated with substance abuse/mental health issues will improve  Medication Management: Evaluate patient's response, side effects, and tolerance of medication regimen.  Therapeutic Interventions: 1 to 1 sessions, Unit Group sessions and Medication administration.  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 RN will report any adverse and/or side effects to prescribing provider.  Therapeutic Interventions: 1 on 1 counseling sessions, Psychoeducation, Medication administration, Evaluate responses to treatment, Monitor vital signs and CBGs as ordered, Perform/monitor CIWA, COWS, AIMS and Fall Risk screenings as ordered, Perform wound care treatments as ordered.  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  Short Term Goals: Engage patient in aftercare planning with referrals and resources  Therapeutic Interventions: Assess for all discharge needs, 1 to 1 time with Social worker, Explore available resources and support systems, Assess for adequacy in community support network, Educate family and significant other(s) on suicide prevention, Complete Psychosocial Assessment, Interpersonal group therapy.  Evaluation of Outcomes: Met  Return home with mother and daughter,follow up [facility] in Treatment: Attending groups: Yes Participating in groups: Yes Taking medication as prescribed: Yes Toleration medication: Yes, no side effects reported at this time Family/Significant other contact made: Yes Patient understands diagnosis: Yes AEB asking for help with getting back on track  Discussing patient identified problems/goals with staff: Yes Medical problems stabilized or resolved: Yes Denies suicidal/homicidal ideation: Yes Issues/concerns per patient self-inventory: None Other: N/A  New problem(s) identified: None identified at this time.   New Short Term/Long Term Goal(s): None identified at this time.   Discharge Plan or Barriers:   Reason for Continuation of Hospitalization:  Medication stabilization   Estimated Length of Stay: Likely d/c tomorrow  Attendees: Patient: 09/29/2016  1:04 PM  Physician: Ursula Alert, MD 09/29/2016  1:04 PM  Nursing: Hoy Register, RN 09/29/2016  1:04 PM  RN Care Manager: Lars Pinks, RN 09/29/2016  1:04 PM  Social Worker: Ripley Fraise 09/29/2016  1:04 PM  Recreational Therapist:  Marjette  09/29/2016  1:04 PM  Other: Norberto Sorenson 09/29/2016  1:04 PM   Other:  09/29/2016  1:04 PM    Scribe for Treatment Team:  Roque Lias LCSW 09/29/2016 1:04 PM

## 2016-09-29 NOTE — [facility] Suicide Risk Assessment (Signed)
[facility] INPATIENT:  Family/Significant Other Suicide Prevention Education  Suicide Prevention Education:  Education Completed; Kelly Crosby, mother, [phone]   has been identified by the patient as the family member/significant other with whom the patient will be residing, and identified as the person(s) who will aid the patient in the event of a mental health crisis (suicidal ideations/suicide attempt).  With written consent from the patient, the family member/significant other has been provided the following suicide prevention education, prior to the and/or following the discharge of the patient.  The suicide prevention education provided includes the following:  Suicide risk factors  Suicide prevention and interventions  National Suicide Hotline telephone number  [facility] telephone number  [facility] 911  [facility] and/or [facility] telephone number  Request made of family/significant other to:  Remove weapons (e.g., guns, rifles, knives), all items previously/currently identified as safety concern.    Remove drugs/medications (over-the-counter, prescriptions, illicit drugs), all items previously/currently identified as a safety concern.  The family member/significant other verbalizes understanding of the suicide prevention education information provided.  The family member/significant other agrees to remove the items of safety concern listed above.  Kelly Crosby 09/29/2016, 3:45 PM

## 2016-09-29 NOTE — Tx Team (Signed)
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 acetaminophen (TYLENOL) tablet 650 mg  650 mg Oral Q4H PRN Patrecia Pour, NP   650 mg at 09/28/16 1350  . alum & mag hydroxide-simeth (MAALOX/MYLANTA) 200-200-20 MG/5ML suspension 30 mL  30 mL Oral PRN Patrecia Pour, NP      . ibuprofen (ADVIL,MOTRIN) tablet 800 mg  800 mg Oral Q8H PRN Derrill Center, NP   800 mg at 09/29/16 1001  . LORazepam (ATIVAN) tablet 1 mg  1 mg Oral Q6H PRN Rozetta Nunnery, NP   1 mg at 09/28/16 2109  . magnesium hydroxide (MILK OF MAGNESIA) suspension 30 mL  30 mL Oral Daily PRN Patrecia Pour, NP      . OLANZapine (ZYPREXA) tablet 5 mg  5 mg Oral QHS Derrill Center, NP   5 mg at 09/28/16 2124  . ondansetron (ZOFRAN) tablet 4 mg  4 mg Oral Q8H PRN Patrecia Pour, NP      . potassium chloride SA (K-DUR,KLOR-CON) CR tablet 20 mEq  20 mEq Oral BID Patrecia Pour, NP   20 mEq at 09/29/16 0803    PTA Medications: Prescriptions Prior to Admission  Medication Sig Dispense Refill Last Dose  . amoxicillin-clavulanate (AUGMENTIN) 875-125 MG tablet Take 1 tablet by mouth every 12 (twelve) hours. (Patient not taking: Reported on 09/26/2016) 10 tablet 0 Not Taking at Unknown time  . benzonatate (TESSALON) 100 MG capsule Take 1 capsule (100 mg total) by mouth every 8 (eight) hours. (Patient not taking: Reported on 09/26/2016) 21 capsule 0 Not Taking at Unknown time  . carboxymethylcellulose (REFRESH PLUS) 0.5 % SOLN Place 1 drop into both eyes 3  (three) times daily as needed.   Not Taking at Unknown time  . cetirizine (ZYRTEC ALLERGY) 10 MG tablet Take 1 tablet (10 mg total) by mouth daily. (Patient not taking: Reported on 08/15/2015) 30 tablet 1 Not Taking at Unknown time  . diazepam (VALIUM) 5 MG tablet Take 1 tablet (5 mg total) by mouth every 8 (eight) hours as needed for muscle spasms. (Patient not taking: Reported on 05/03/2015) 15 tablet 0 Not Taking at Unknown time  . fluticasone (FLONASE) 50 MCG/ACT nasal spray Place 2 sprays into both nostrils daily. (Patient not taking: Reported on 08/15/2015) 16 g 1 Not Taking at Unknown time  . Multiple Vitamin (MULTIVITAMIN WITH MINERALS) TABS tablet Take 1 tablet by mouth daily.   Past Week at Unknown time  . naproxen (NAPROSYN) 250 MG tablet Take 1 tablet (250 mg total) by mouth 2 (two) times daily with a meal. (Patient not taking: Reported on 08/15/2015) 30 tablet 0 Not Taking at Unknown time    Treatment Modalities: Medication Management, Group therapy, Case management,  1 to 1 session with clinician, Psychoeducation, Recreational therapy.   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  Therapeutic Interventions: 1 to 1 sessions, Unit Group sessions and Medication administration.  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[place]) Active Problems:   Acute psychosis   Long Term Goal(s): Improvement in symptoms so as ready for discharge  Short Term Goals: Ability to verbalize feelings will improve Ability to demonstrate self-control will improve Ability to maintain clinical measurements within normal limits will improve Ability to identify changes in lifestyle to reduce recurrence of condition will improve Ability to demonstrate self-control will improve Ability to identify triggers associated with substance abuse/mental health issues will improve  Medication Management: Evaluate patient's response, side effects, and tolerance of medication regimen.  Therapeutic Interventions: 1 to 1 sessions, Unit Group sessions and Medication administration.  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 RN will report any adverse and/or side effects to prescribing provider.  Therapeutic Interventions: 1 on 1 counseling sessions, Psychoeducation, Medication administration, Evaluate responses to treatment, Monitor vital signs and CBGs as ordered, Perform/monitor CIWA, COWS, AIMS and Fall Risk screenings as ordered, Perform wound care treatments as ordered.  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  Short Term Goals: Engage patient in aftercare planning with referrals and resources  Therapeutic Interventions: Assess for all discharge needs, 1 to 1 time with Social worker, Explore available resources and support systems, Assess for adequacy in community support network, Educate family and significant other(s) on suicide prevention, Complete Psychosocial Assessment, Interpersonal group therapy.  Evaluation of Outcomes: Met   Progress in Treatment: Attending groups: Yes Participating in groups: Yes Taking medication as prescribed: Yes Toleration medication: Yes, no side effects reported at this time Family/Significant other contact made: No Patient understands diagnosis: No  Limited insight-"I smoked some weed that was laced with benzos"  Discussing patient identified problems/goals with staff: Yes Medical problems stabilized or resolved: Yes Denies suicidal/homicidal ideation: Yes Issues/concerns per patient self-inventory: None Other: N/A  New problem(s) identified: None identified at this time.   New Short Term/Long Term Goal(s): None identified at this time.   Discharge Plan or Barriers: Return home,follow up outpt  Reason for Continuation of Hospitalization:  Delusions  Hallucinations Homicidal ideation Medication stabilization Suicidal ideation   Estimated Length of Stay: 3-5 days  Attendees: Patient: 09/29/2016  1:09 PM  Physician: Ursula Alert, MD 09/29/2016  1:09 PM  Nursing: Hoy Register, RN 09/29/2016  1:09 PM  RN Care Manager: Lars Pinks, RN 09/29/2016  1:09 PM  Social Worker: Ripley Fraise 09/29/2016  1:09 PM  Recreational  Therapist: Marjette  09/29/2016  1:09 PM  Other: Norberto Sorenson 09/29/2016  1:09 PM  Other:  09/29/2016  1:09 PM    Scribe for Treatment Team:  Roque Lias LCSW 09/29/2016 1:09 PM

## 2016-09-29 NOTE — Progress Notes (Signed)
[facility] MD Progress Note  09/29/2016 1:40 PM Kelly Crosby  MRN:  [id number] Subjective: Patient states " I feel ok."  Objective: Patient seen and chart reviewed.Discussed patient with treatment team.  Pt reports that she started having AH/VH after smoking cannabis , she also believes it may have been laced since her UDS came back positive for BZD. Pt denies using BZD. Pt reports continued anxiety on and off , but is improving. Pt has been making use of PRN medications. Pt denies ADRs to medications offered. Pt reports left wrist pain - is improving . Continues to participate in milieu , no disruptive issues noted.    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 Acute psychosis [F23] 09/26/2016  . Cannabis use disorder, severe, dependence (HCC) [F12.20] 09/26/2016  . 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   Past Medical History:  Past Medical History:  Diagnosis Date  . Anxiety   . Depression     Past Surgical History:  Procedure Laterality Date  . NO PAST SURGERIES     Family History:  Family History  Problem Relation Age of Onset  . Hypertension Other    Family Psychiatric  History: Please see H&P.  Social History: Please see H&P.  History  Alcohol Use No     History  Drug Use  . Types: Marijuana    Social History   Social History  . Marital status: Single    Spouse name: N/A  . Number of children: N/A  . Years of education: N/A   Social History Main Topics  . Smoking status: Current Some Day Smoker    Types: Cigars  . Smokeless tobacco: Never Used  . Alcohol use No  . Drug use: Yes    Types: Marijuana  . Sexual activity: Yes    Birth control/  protection: Injection   Other Topics Concern  . None   Social History Narrative  . None   Additional Social History:                         Sleep: Fair  Appetite:  Fair  Current Medications: Current Facility-Administered Medications  Medication Dose Route Frequency Provider Last Rate Last Dose  . acetaminophen (TYLENOL) tablet 650 mg  650 mg Oral Q4H PRN Charm Rings, NP   650 mg at 09/28/16 1350  . alum & mag hydroxide-simeth (MAALOX/MYLANTA) 200-200-20 MG/5ML suspension 30 mL  30 mL Oral PRN Charm Rings, NP      . ibuprofen (ADVIL,MOTRIN) tablet 800 mg  800 mg Oral Q8H PRN Oneta Rack, NP   800 mg at 09/29/16 1001  . LORazepam (ATIVAN) tablet 1 mg  1 mg Oral Q6H PRN Jackelyn Poling, NP   1 mg at 09/28/16 2109  . magnesium hydroxide (MILK OF MAGNESIA) suspension 30 mL  30 mL Oral Daily PRN Charm Rings, NP      . OLANZapine (ZYPREXA) tablet 5 mg  5 mg Oral QHS Oneta Rack, NP   5 mg at 09/28/16 2124  . ondansetron (ZOFRAN) tablet 4 mg  4 mg Oral Q8H PRN Charm Rings, NP      . potassium chloride SA (K-DUR,KLOR-CON) CR tablet 20 mEq  20  mEq Oral BID Charm Rings, NP   20 mEq at 09/29/16 1610    Lab Results:  Results for orders placed or performed during the hospital encounter of 09/26/16 (from the past 48 hour(s))  Lipid panel     Status: Abnormal   Collection Time: 09/28/16  6:18 AM  Result Value Ref Range   Cholesterol 134 0 - 200 mg/dL   Triglycerides 960 <454 mg/dL   HDL 30 (L) >09 mg/dL   Total CHOL/HDL Ratio 4.5 RATIO   VLDL 22 0 - 40 mg/dL   LDL Cholesterol 82 0 - 99 mg/dL    Comment:        Total Cholesterol/HDL:CHD Risk Coronary Heart Disease Risk Table                     Men   Women  1/2 Average Risk   3.4   3.3  Average Risk       5.0   4.4  2 X Average Risk   9.6   7.1  3 X Average Risk  23.4   11.0        Use the calculated Patient Ratio above and the CHD Risk Table to determine the patient's CHD Risk.        ATP III  CLASSIFICATION (LDL):  <100     mg/dL   Optimal  811-914  mg/dL   Near or Above                    Optimal  130-159  mg/dL   Borderline  782-956  mg/dL   High  >213     mg/dL   Very High Performed at [facility], [address], [place], [place] [address]   TSH     Status: None   Collection Time: 09/28/16  6:18 AM  Result Value Ref Range   TSH 1.479 0.350 - 4.500 uIU/mL    Comment: Performed by a 3rd Generation assay with a functional sensitivity of <=0.01 uIU/mL. Performed at [facility], [address], [place], [place] [address]   Hemoglobin A1c     Status: None   Collection Time: 09/28/16  6:18 AM  Result Value Ref Range   Hgb A1c MFr Bld 5.3 4.8 - 5.6 %    Comment: (NOTE)         Pre-diabetes: 5.7 - 6.4         Diabetes: >6.4         Glycemic control for adults with diabetes: <7.0    Mean Plasma Glucose 105 mg/dL    Comment: (NOTE) Performed At: [facility] [address] [place], [place] [id number] Mila Homer MD [id number] Performed at [facility], [address], [place], [place] [address]   Prolactin     Status: Abnormal   Collection Time: 09/28/16  6:18 AM  Result Value Ref Range   Prolactin 71.1 (H) 4.8 - 23.3 ng/mL    Comment: (NOTE) Performed At: [facility] [address] [place], [place] [id number] Mila Homer MD VF:[id number] Performed at [facility], [address] [place], [place] [address]     Blood Alcohol level:  Lab Results  Component Value Date   [facility] <5 09/26/2016    Metabolic Disorder Labs: Lab Results  Component Value Date   HGBA1C 5.3 09/28/2016   MPG 105 09/28/2016   Lab Results  Component Value Date   PROLACTIN 71.1 (  H) 09/28/2016   Lab Results  Component Value Date   CHOL 134 09/28/2016   TRIG 110 09/28/2016   HDL 30 (L) 09/28/2016   CHOLHDL 4.5 09/28/2016   VLDL 22 09/28/2016   LDLCALC 82 09/28/2016    Physical  Findings: AIMS: Facial and Oral Movements Muscles of Facial Expression: None, normal Lips and Perioral Area: None, normal Jaw: None, normal Tongue: None, normal,Extremity Movements Upper (arms, wrists, hands, fingers): None, normal Lower (legs, knees, ankles, toes): None, normal, Trunk Movements Neck, shoulders, hips: None, normal, Overall Severity Severity of abnormal movements (highest score from questions above): None, normal Incapacitation due to abnormal movements: None, normal Patient's awareness of abnormal movements (rate only patient's report): No Awareness, Dental Status Current problems with teeth and/or dentures?: No Does patient usually wear dentures?: No  CIWA:    COWS:     Musculoskeletal: Strength & Muscle Tone: within normal limits Gait & Station: normal Patient leans: N/A  Psychiatric Specialty Exam: Physical Exam  Nursing note and vitals reviewed.   Review of Systems  Musculoskeletal:       Wrist pain   Psychiatric/Behavioral: The patient is nervous/anxious.   All other systems reviewed and are negative.   Blood pressure (!) 116/59, pulse 100, temperature 98.7 F (37.1 C), temperature source Oral, resp. rate 18, height 5\' 6"  (1.676 m), weight 117.5 kg (259 lb), SpO2 98 %.Body mass index is 41.8 kg/m.  General Appearance: Casual  Eye Contact:  Fair  Speech:  Clear and Coherent  Volume:  Normal  Mood:  Anxious  Affect:  Congruent  Thought Process:  Goal Directed and Descriptions of Associations: Intact  Orientation:  Full (Time, Place, and Person)  Thought Content:  Rumination  Suicidal Thoughts:  No  Homicidal Thoughts:  No  Memory:  Immediate;   Fair Recent;   Fair Remote;   Fair  Judgement:  Fair  Insight:  Present  Psychomotor Activity:  Restlessness  Concentration:  Concentration: Fair and Attention Span: Fair  Recall:  FiservFair  Fund of Knowledge:  Fair  Language:  Fair  Akathisia:  No  Handed:  Right  AIMS (if indicated):     Assets:   Manufacturing systems engineerCommunication Skills Social Support  ADL's:  Intact  Cognition:  WNL  Sleep:  Number of Hours: 5.5  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  Treatment Plan Summary: Daily contact with patient to assess and evaluate symptoms and progress in treatment and Medication management   Will continue to provide substance abuse counseling for cannabis abuse. Zyprexa 5 mg po qhs for psychosis. Vistaril 25 mg po prn for anxiety sx. For hypokalemia: Repeat CMP tomorrow AM. Continue to replace with Kdur until labs reviewed. EKG reviewed - EKG: qtc - wnl normal EKG, normal sinus rhythm. For writs pain- symptomatic treatment , apply splint. CSW will continue to work on disposition.   Tenia Goh, MD 09/29/2016, 1:40 PM

## 2016-09-30 LAB — COMPREHENSIVE METABOLIC PANEL
ALBUMIN: 4 g/dL (ref 3.5–5.0)
ALT: 16 U/L (ref 14–54)
AST: 19 U/L (ref 15–41)
Alkaline Phosphatase: 56 U/L (ref 38–126)
Anion gap: 6 (ref 5–15)
BUN: 14 mg/dL (ref 6–20)
CHLORIDE: 106 mmol/L (ref 101–111)
CO2: 26 mmol/L (ref 22–32)
Calcium: 9.3 mg/dL (ref 8.9–10.3)
Creatinine, Ser: 0.94 mg/dL (ref 0.44–1.00)
GFR calc Af Amer: >60 mL/min (ref >60)
Glucose, Bld: 92 mg/dL (ref 65–99)
POTASSIUM: 4.3 mmol/L (ref 3.5–5.1)
SODIUM: 138 mmol/L (ref 135–145)
Total Bilirubin: 0.5 mg/dL (ref 0.3–1.2)
Total Protein: 7.2 g/dL (ref 6.5–8.1)

## 2016-09-30 MED ORDER — HYDROXYZINE HCL 25 MG PO TABS: 25.0000 mg | ORAL_TABLET | Freq: Four times a day (QID) | ORAL | 0 refills | Status: DC | PRN

## 2016-09-30 MED ORDER — OLANZAPINE 5 MG PO TABS: 5.0000 mg | ORAL_TABLET | Freq: Every day | ORAL | 0 refills | Status: DC

## 2016-09-30 NOTE — Plan of Care (Signed)
Problem: [facility] Participation in Recreation Therapeutic Interventions Goal: STG-Patient will identify at least five coping skills for ** STG: Coping Skills - Patient will be able to identify at least 5 coping skills for hearing/seeing things by conclusion of recreation therapy tx  Outcome: Completed/Met Date Met: 09/30/16 Pt was able to identify coping skills at completion of coping skills recreation therapy session.  Victorino Sparrow, LRT/CTRS

## 2016-09-30 NOTE — Progress Notes (Signed)
Patient ID: Kelly Crosby, female   DOB: 12-13-1990, 26 y.o.   MRN: [id number] Patient discharged to home/self care.  Patient received and acknowledged understanding of all discharge instructions and follow up care.  Patient stated that she was excited to discharged and plans to maintain care upon discharge from hospital.  Patient was bright smiling and upbeat at time of discharge with no morbid thoughts or suicidal ideations.   Patient stated that she feels ready to maintain mental health care in a community setting and is able to contract for safety upon discharge.

## 2016-09-30 NOTE — Progress Notes (Signed)
Kelly Crosby. Kelly Crosby had been up and visible in milieu this evening, attended and participated in evening activities. Kelly Crosby spoke about how she is to discharge in the morning and feels ready to go. Kelly PitchSierra was seen interacting appropriately with fellow peers and did receive all bedtime medications without incident. A. Support and encouragement provided. R. Safety maintained, will continue to monitor.

## 2016-09-30 NOTE — Progress Notes (Signed)
  [facility] Dis[facility] Plan :  Will you be returning to the same living situation after discharge:  Yes,  home At discharge, do you have transportation home?: Yes,  mother Do you have the ability to pay for your medications: Yes,  MCD  Release of information consent forms completed and in the chart;  Patient's signature needed at discharge.  Patient to Follow up at: Follow-up Information    [facility] on 10[facility]13/2016.   Why:  Monday at 4:30 for a 5:00 appointment to be opened for services.  Bring your MCD card and ID.  They have both a therapist and psychiatrist. Contact information: [address] [place] [phone]          Next level of care provider [facility] Link:no  Safety Planning and Suicide Prevention discussed: Yes,  yes     Has patient been referred to the Quitline?: N/A patient is not a smoker  Patient has been referred for addiction treatment: Yes  Ida RogueRodney B Zahari Xiang 09/30/2016, 9:24 AM

## 2016-09-30 NOTE — Progress Notes (Signed)
Recreation Therapy Notes  Date: 09/30/16 Time: 1000 Location: 500 [address] Dayroom  Group Topic: Coping Skills  Goal Area(s) Addresses:  Patients will be able to identify positive coping skills. Patients will be able to identify benefits of using coping skills post d/c.  Behavioral Response: Engaged  Intervention: Worksheet, pencils, dry erase marker, dry erase board  Activity: Mindmap.  Patients were given a worksheet of a a blank mind map.  As a group, patients filled in the first 8 boxes with triggers that require coping skills.  Patients were to then come up with 3 coping skills for each trigger identified by the group.  Once patients identified their coping skills, the group would fill in the remainder of the map on the board.  Education: PharmacologistCoping Skills, Building control surveyorDischarge Planning.   Education Outcome: Acknowledges understanding/In group clarification offered/Needs additional education.   Clinical Observations/Feedback: Pt stated coping skills allow "to stay sane."  Pt identified some of her coping skills as budgeting, avoid triggers, let go of toxins, breathing and self care.  Pt went on to say positive coping skills keeps you out of bad situations.    Caroll RancherMarjette Malan Werk, LRT/CTRS     Caroll RancherLindsay, Quentyn Kolbeck A 09/30/2016 12:25 PM
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
## 2016-09-30 NOTE — [facility] Suicide Risk Assessment (Signed)
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 Acute psychosis [F23] 09/26/2016  . Cannabis use disorder, severe, dependence (HCC) [F12.20] 09/26/2016  . 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 Negative for depression, hallucinations and suicidal ideas. The patient is not nervous/anxious and does not have insomnia.   All other systems reviewed and are negative.   Blood pressure 126/90, pulse 76, temperature 97.4 F (36.3 C), temperature source Oral, resp. rate 16, height 5\' 6"  (1.676 m), weight 117.5 kg (259 lb), SpO2 98 %.Body mass index is 41.8 kg/m.  General Appearance: Casual  Eye Contact::  Fair  Speech:  Clear and Coherent409  Volume:  Normal  Mood:  Euthymic  Affect:  Appropriate  Thought Process:  Goal Directed and Descriptions of Associations: Intact  Orientation:  Full (Time, Place, and Person)  Thought Content:  Logical  Suicidal Thoughts:  No  Homicidal Thoughts:  No  Memory:  Immediate;   Fair Recent;   Fair Remote;   Fair  Judgement:  Fair  Insight:  Fair  Psychomotor Activity:  Normal  Concentration:  Fair  Recall:  FiservFair  Fund of Knowledge:Fair  Language: Fair  Akathisia:  No  Handed:  Right  AIMS (if indicated):   0  Assets:  Communication Skills Desire for Improvement Physical Health Social Support  Sleep:  Number of Hours: 6.75  Cognition: WNL  ADL's:  Intact   Mental Status Per Nursing Assessment::   On Admission:      Demographic Factors:  NA  Loss Factors: NA  Historical Factors: Impulsivity  Risk Reduction Factors:   Positive social support and Positive therapeutic relationship  Continued Clinical Symptoms:  Alcohol/Substance Abuse/Dependencies  Cognitive Features That Contribute To Risk:  None    Suicide Risk:  Minimal: No identifiable suicidal ideation.  Patients presenting with no risk factors but with morbid ruminations; may be classified as minimal risk based on the severity of the depressive symptoms  Follow-up Information    [facility] on [facility]30/2016.   Contact information: [employer] [place] [phone]          Plan Of Care/Follow-up recommendations:  Activity:  no restrictions Diet:  regular Tests:  as per out aptient recommendations Other:  none  Brian Zeitlin, MD 09/30/2016, 9:21 AM

## 2016-10-05 ENCOUNTER — Emergency Department (HOSPITAL_COMMUNITY)
Admission: EM | Admit: 2016-10-05 | Discharge: 2016-10-06 | Disposition: A | Discharge status: Home / Self Care | Payer: Medicaid Other | Attending: Emergency Medicine | Admitting: Emergency Medicine

## 2016-10-05 DIAGNOSIS — R451 Restlessness and agitation: Secondary | ICD-10-CM | POA: Diagnosis present

## 2016-10-05 DIAGNOSIS — Z79899 Other long term (current) drug therapy: Secondary | ICD-10-CM | POA: Diagnosis not present

## 2016-10-05 DIAGNOSIS — Z888 Allergy status to other drugs, medicaments and biological substances status: Secondary | ICD-10-CM | POA: Diagnosis not present

## 2016-10-05 DIAGNOSIS — F1220 Cannabis dependence, uncomplicated: Secondary | ICD-10-CM | POA: Diagnosis not present

## 2016-10-05 DIAGNOSIS — F17290 Nicotine dependence, other tobacco product, uncomplicated: Secondary | ICD-10-CM | POA: Diagnosis not present

## 2016-10-05 DIAGNOSIS — F250 Schizoaffective disorder, bipolar type: Secondary | ICD-10-CM | POA: Diagnosis not present

## 2016-10-05 DIAGNOSIS — F17210 Nicotine dependence, cigarettes, uncomplicated: Secondary | ICD-10-CM | POA: Diagnosis not present

## 2016-10-05 DIAGNOSIS — F28 Other psychotic disorder not due to a substance or known physiological condition: Secondary | ICD-10-CM | POA: Insufficient documentation

## 2016-10-05 DIAGNOSIS — F259 Schizoaffective disorder, unspecified: Secondary | ICD-10-CM | POA: Diagnosis present

## 2016-10-05 LAB — CBC WITH DIFFERENTIAL/PLATELET
BASOS ABS: 0 10*3/uL (ref 0.0–0.1)
BASOS PCT: 0 %
EOS PCT: 0 %
Eosinophils Absolute: 0 10*3/uL (ref 0.0–0.7)
HCT: 37.7 % (ref 36.0–46.0)
Hemoglobin: 13.6 g/dL (ref 12.0–15.0)
Lymphocytes Relative: 15 %
Lymphs Abs: 1.9 10*3/uL (ref 0.7–4.0)
MCH: 28.3 pg (ref 26.0–34.0)
MCHC: 36.1 g/dL — ABNORMAL HIGH (ref 30.0–36.0)
MCV: 78.5 fL (ref 78.0–100.0)
MONO ABS: 0.6 10*3/uL (ref 0.1–1.0)
Monocytes Relative: 5 %
Neutro Abs: 10.2 10*3/uL — ABNORMAL HIGH (ref 1.7–7.7)
Neutrophils Relative %: 80 %
PLATELETS: 337 10*3/uL (ref 150–400)
RBC: 4.8 MIL/uL (ref 3.87–5.11)
RDW: 13.4 % (ref 11.5–15.5)
WBC: 12.8 10*3/uL — ABNORMAL HIGH (ref 4.0–10.5)

## 2016-10-05 LAB — BASIC METABOLIC PANEL
ANION GAP: 7 (ref 5–15)
BUN: 9 mg/dL (ref 6–20)
CALCIUM: 9.5 mg/dL (ref 8.9–10.3)
CO2: 24 mmol/L (ref 22–32)
CREATININE: 0.95 mg/dL (ref 0.44–1.00)
Chloride: 109 mmol/L (ref 101–111)
GLUCOSE: 100 mg/dL — AB (ref 65–99)
Potassium: 3.8 mmol/L (ref 3.5–5.1)
Sodium: 140 mmol/L (ref 135–145)

## 2016-10-05 LAB — RAPID URINE DRUG SCREEN, HOSP PERFORMED
AMPHETAMINES: NOT DETECTED
BENZODIAZEPINES: NOT DETECTED
Barbiturates: NOT DETECTED
COCAINE: NOT DETECTED
OPIATES: NOT DETECTED
TETRAHYDROCANNABINOL: POSITIVE — AB

## 2016-10-05 LAB — ETHANOL: Alcohol, Ethyl (B): <5 mg/dL (ref <5)

## 2016-10-05 MED ORDER — CARBAMAZEPINE ER 200 MG PO TB12
200.0000 mg | ORAL_TABLET | Freq: Two times a day (BID) | ORAL | Status: DC
  Administered 2016-10-05 – 2016-10-06 (×2): 200 mg via ORAL
  Filled 2016-10-05 (×2): qty 1

## 2016-10-05 MED ORDER — OLANZAPINE 10 MG PO TBDP
10.0000 mg | ORAL_TABLET | Freq: Every day | ORAL | Status: DC
Start: 2016-10-05 — End: 2016-10-05
  Administered 2016-10-05: 10 mg via ORAL
  Filled 2016-10-05 (×2): qty 1

## 2016-10-05 MED ORDER — OLANZAPINE 5 MG PO TABS: 5.0000 mg | ORAL_TABLET | Freq: Every day | ORAL | Status: DC

## 2016-10-05 MED ORDER — DIPHENHYDRAMINE HCL 50 MG/ML IJ SOLN
50.0000 mg | Freq: Once | INTRAMUSCULAR | Status: AC
  Administered 2016-10-05: 50 mg via INTRAMUSCULAR
  Filled 2016-10-05: qty 1

## 2016-10-05 MED ORDER — ARIPIPRAZOLE 5 MG PO TABS
5.0000 mg | ORAL_TABLET | Freq: Two times a day (BID) | ORAL | Status: DC
  Administered 2016-10-05 – 2016-10-06 (×2): 5 mg via ORAL
  Filled 2016-10-05 (×2): qty 1

## 2016-10-05 MED ORDER — LORAZEPAM 2 MG/ML IJ SOLN
2.0000 mg | Freq: Once | INTRAMUSCULAR | Status: AC
  Administered 2016-10-05: 2 mg via INTRAMUSCULAR
  Filled 2016-10-05: qty 1

## 2016-10-05 MED ORDER — OLANZAPINE 10 MG PO TBDP: 10.0000 mg | ORAL_TABLET | Freq: Every day | ORAL | Status: DC

## 2016-10-05 MED ORDER — ZIPRASIDONE MESYLATE 20 MG IM SOLR
10.0000 mg | Freq: Once | INTRAMUSCULAR | Status: AC
  Administered 2016-10-05: 10 mg via INTRAMUSCULAR
  Filled 2016-10-05: qty 20

## 2016-10-05 MED ORDER — STERILE WATER FOR INJECTION IJ SOLN
INTRAMUSCULAR | Status: AC
  Administered 2016-10-05: 1.2 mL
  Filled 2016-10-05: qty 10

## 2016-10-05 NOTE — ED Notes (Signed)
Hourly rounding reveals patient sleeping in room. No complaints, stable, in no acute distress. Q15 minute rounds and monitoring via Security Cameras to continue. 

## 2016-10-05 NOTE — ED Notes (Signed)
No respiratory or acute distress noted alert and talking but confused talking but not making sense, call light in reach hospital sitter with pt no reaction to medication noted.

## 2016-10-05 NOTE — ED Notes (Signed)
[facility] Expected date:  Expected time:  Means of arrival:  Comments: rm 25

## 2016-10-05 NOTE — ED Notes (Signed)
No respiratory or acute distress noted resting in bed with eyes closed call light in reach hospital sitter at bedside no reaction to medication noted.

## 2016-10-05 NOTE — ED Notes (Signed)
Report to include situation, background, assessment and recommendations from Dawnley RN. Patient sleeping, respirations regular and unlabored. Q15 minute rounds and security camera observation to continue.   

## 2016-10-05 NOTE — ED Notes (Signed)
No respiratory or acute distress noted alert and confused at times pt at times calls out eating sandwich and drinking drink call light in reach.

## 2016-10-05 NOTE — ED Notes (Signed)
Pt trying to get out of bed informed Dr Wilkie AyeHorton and ordered restraints to keep pt from exiting bed and pulling off clothing.
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
## 2016-10-05 NOTE — ED Notes (Signed)
Bed: [facility] Expected date:  Expected time:  Means of arrival:  Comments: EMS  

## 2016-10-05 NOTE — ED Notes (Signed)
Pt in room with tech at bedside. Pt would not keep gown on at time of triage. Security present outside of room and Dr.Horton made aware of pt

## 2016-10-05 NOTE — ED Notes (Signed)
Removed from restraints pt resting in bed with eyes closed call light in reach hospital sitter at bedside. No respiratory or acute distress noted no reaction to medication noted.

## 2016-10-05 NOTE — ED Notes (Signed)
Patient has been sleeping most of the day.  She was not able to answer many questions on arrival.  She is currently awake and has been pleasant and cooperative.

## 2016-10-05 NOTE — ED Notes (Signed)
TTS at bedside. 

## 2016-10-05 NOTE — ED Triage Notes (Signed)
Per EMS pt was found wandering around parking lot of apartment complex. Pt denied using any substances tonight. Pt was seen seen several weeks ago for same.

## 2016-10-05 NOTE — ED Notes (Signed)
Pt resting and refused staff to let them get vital signs at this time.

## 2016-10-05 NOTE — ED Provider Notes (Signed)
[facility]-EMERGENCY DEPT Provider Note   CSN: [id number] Arrival date & time: 10/05/16  [id number]    By signing my name below, I, Valentino SaxonBianca Contreras, attest that this documentation has been prepared under the direction and in the presence of Shon Batonourtney F Jeimy Bickert, MD. Electronically Signed: Valentino SaxonBianca Contreras, ED Scribe. 10/05/16. 1:26 AM.  History   Chief Complaint Chief Complaint  Patient presents with  . Psychiatric Evaluation   HPI   LEVEL 5 CAVEAT: HPI and ROS limited due to acuity of condition.  HPI Comments: Kelly PitchSierra Pickar is a 26 y.o. female with PMHx of depression and anxiety brought in by ambulance who presents to the Emergency Department in need of a psychiatric evaluation. Per EMS note, pt was found wandering around the parking lot of an apartment complex. Pt denies any illicit drug use or alcohol consumption tonight. She was seen in the ED on 02/16 presenting with similar behavior. Pt notes she came in the ED today to "see daddy". No known PMHx of psychiatric issues.   Most recent behavioral health admission with psychosis thought to be secondary to substance abuse.   Past Medical History:  Diagnosis Date  . Anxiety   . Depression     Patient Active Problem List   Diagnosis Date Noted  . Acute psychosis 09/26/2016  . Cannabis use disorder, severe, dependence (HCC) 09/26/2016  . 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 NO PAST SURGERIES      OB History    No data available       Home Medications    Prior to Admission medications   Medication Sig Start Date End Date Taking? Authorizing Provider  OLANZapine (ZYPREXA) 5 MG tablet Take 1 tablet (5 mg total) by mouth at bedtime. 09/30/16  Yes Adonis BrookSheila Agustin, NP  hydrOXYzine (ATARAX/VISTARIL) 25 MG tablet Take 1 tablet (25 mg total) by mouth every 6 (six) hours as needed for anxiety. Patient not taking:  Reported on 10/05/2016 09/30/16   Adonis BrookSheila Agustin, NP    Family History Family History  Problem Relation Age of Onset  . Hypertension Other     Social History Social History  Substance Use Topics  . Smoking status: Current Some Day Smoker    Types: Cigars  . Smokeless tobacco: Never Used  . Alcohol use No     Allergies   Amoxicillin   Review of Systems Review of Systems  Unable to perform ROS: Acuity of condition  Psychiatric/Behavioral: Positive for agitation.  All other systems reviewed and are negative.    Physical Exam Updated Vital Signs Ht 5\' 6"  (1.676 m)   Wt 259 lb (117.5 kg)   BMI 41.80 kg/m   Physical Exam  Constitutional: She is oriented to person, place, and time. No distress.  Obese, anxious appearing, agitated and pacing around the room  HENT:  Head: Normocephalic and atraumatic.  Cardiovascular: Normal rate and regular rhythm.   Pulmonary/Chest: Effort normal. No respiratory distress.  Neurological: She is alert and oriented to person, place, and time.  Skin: Skin is warm and dry.  Psychiatric:  Bizarre affect, tangential with speech, redirectable at times, mumbles to herself, attempts to take off her clothes  Nursing note and vitals reviewed.    ED Treatments / Results   COORDINATION OF CARE: 1:21 AM Discussed treatment plan with pt at bedside and pt agreed to plan.   Labs (all labs ordered are listed, but only abnormal  results are displayed) Labs Reviewed  CBC WITH DIFFERENTIAL/PLATELET - Abnormal; Notable for the following:       Result Value   WBC 12.8 (*)    MCHC 36.1 (*)    Neutro Abs 10.2 (*)    All other components within normal limits  BASIC METABOLIC PANEL - Abnormal; Notable for the following:    Glucose, Bld 100 (*)    All other components within normal limits  ETHANOL  RAPID URINE DRUG SCREEN, HOSP PERFORMED    EKG  EKG Interpretation None       Radiology No results found.  Procedures Procedures (including  critical care time)  Medications Ordered in ED Medications  diphenhydrAMINE (BENADRYL) injection 50 mg (not administered)  OLANZapine zydis (ZYPREXA) disintegrating tablet 10 mg (not administered)  ziprasidone (GEODON) injection 10 mg (10 mg Intramuscular Given 10/05/16 0115)  sterile water (preservative free) injection (1.2 mLs  Given 10/05/16 0116)  LORazepam (ATIVAN) injection 2 mg (2 mg Intramuscular Given 10/05/16 0135)     Initial Impression / Assessment and Plan / ED Course  I have reviewed the triage vital signs and the nursing notes.  Pertinent labs & imaging results that were available during my care of the patient were reviewed by me and considered in my medical decision making (see chart for details).     Patient presents with psychotic behavior and hypersexual tendencies. He appears generally agitated. She did not respond to Geodon, Ativan, Benadryl. She was subsequently given Zyprexa. She is somewhat redirectable. Initial lab work is reassuring. We'll have TTS reevaluate patient.  Final Clinical Impressions(s) / ED Diagnoses   Final diagnoses:  Other psychotic disorder not due to substance or known physiological condition    New Prescriptions New Prescriptions   No medications on file   I personally performed the services described in this documentation, which was scribed in my presence. The recorded information has been reviewed and is accurate.     Shon Baton, MD 10/05/16 [phone]

## 2016-10-05 NOTE — ED Notes (Signed)
Pt threw first zyprexa on floor laughing about it. Got new one and placed on pt tongue.

## 2016-10-06 DIAGNOSIS — Z79899 Other long term (current) drug therapy: Secondary | ICD-10-CM | POA: Diagnosis not present

## 2016-10-06 DIAGNOSIS — Z888 Allergy status to other drugs, medicaments and biological substances status: Secondary | ICD-10-CM | POA: Diagnosis not present

## 2016-10-06 DIAGNOSIS — F250 Schizoaffective disorder, bipolar type: Secondary | ICD-10-CM

## 2016-10-06 DIAGNOSIS — F1220 Cannabis dependence, uncomplicated: Secondary | ICD-10-CM | POA: Diagnosis not present

## 2016-10-06 DIAGNOSIS — F17210 Nicotine dependence, cigarettes, uncomplicated: Secondary | ICD-10-CM | POA: Diagnosis not present

## 2016-10-06 MED ORDER — ACETAMINOPHEN 325 MG PO TABS
650.0000 mg | ORAL_TABLET | Freq: Once | ORAL | Status: AC
  Administered 2016-10-06: 650 mg via ORAL
  Filled 2016-10-06: qty 2

## 2016-10-06 MED ORDER — CARBAMAZEPINE ER 200 MG PO TB12: 200.0000 mg | ORAL_TABLET | Freq: Two times a day (BID) | ORAL | 0 refills | Status: DC

## 2016-10-06 MED ORDER — ARIPIPRAZOLE 5 MG PO TABS: 5.0000 mg | ORAL_TABLET | Freq: Two times a day (BID) | ORAL | 0 refills | Status: DC

## 2016-10-06 NOTE — ED Notes (Signed)
Pt discharged home. Discharged instructions read to pt who verbalized understanding. All belongings returned to pt who signed for same. Denies SI/HI, is not delusional and not responding to internal stimuli. Escorted pt to the ED exit.    

## 2016-10-06 NOTE — ED Notes (Signed)
Ice water given

## 2016-10-06 NOTE — ED Notes (Signed)
Hourly rounding reveals patient sleeping in room. No complaints, stable, in no acute distress. Q15 minute rounds and monitoring via Security Cameras to continue. 
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
## 2016-10-06 NOTE — ED Notes (Signed)
Hourly rounding reveals patient in room. No complaints, stable, in no acute distress. Q15 minute rounds and monitoring via Security Cameras to continue. 

## 2016-10-06 NOTE — BH Assessment (Signed)
[facility] Assessment Progress Note  Per Mojeed Akintayo, MD, this pt does not require psychiatric hospitalization at this time.  Pt is to be discharged from [facility] with recommendation to follow up with [facility].  This has been included in pt's discharge instructions.  Pt's nurse, Diane, has been notified.  Treyvonne Tata, MA Triage Specialist [phone]     

## 2016-10-06 NOTE — Discharge Instructions (Signed)
For your ongoing behavioral health needs you are advised to follow up with [facility].  New patients are seen at their walk-in clinic.  Walk-in hours are Monday - Friday from 8:00 am - 12:00 pm, and from 1:00 pm - 3:00 pm.  Walk-in patients are seen on a first come, first served basis, so try to arrive as early as possible for the best chance of being seen the same day.  There is an initial fee of $22.50: ° °     [facility] °     [address] °     [place], [place] [address] °     [phone] °

## 2016-10-06 NOTE — [facility] Suicide Risk Assessment (Signed)
Suicide Risk Assessment  Discharge Assessment   [facility]   Principal Problem: Schizoaffective disorder [facility](HCC) Discharge Diagnoses:  Patient Active Problem List   Diagnosis Date Noted  . Schizoaffective disorder (HCC) [F25.9] 09/26/2016    Priority: High  . Cannabis use disorder, severe, dependence (HCC) [F12.20] 09/26/2016    Priority: High    Total Time spent with patient: 45 minutes Musculoskeletal: Strength & Muscle Tone: within normal limits Gait & Station: normal Patient leans: N/A  Psychiatric Specialty Exam: Physical Exam  Constitutional: She is oriented to person, place, and time. She appears well-developed and well-nourished.  HENT:  Head: Normocephalic.  Eyes: Pupils are equal, round, and reactive to light.  Respiratory: Effort normal.  Musculoskeletal: Normal range of motion.  Neurological: She is alert and oriented to person, place, and time.  Psychiatric: She has a normal mood and affect. Her speech is normal and behavior is normal. Judgment and thought content normal. Cognition and memory are normal.    Review of Systems  All other systems reviewed and are negative.   Blood pressure 117/60, pulse 67, temperature 98.9 F (37.2 C), temperature source Oral, resp. rate 18, height 5\' 6"  (1.676 m), weight 117.5 kg (259 lb), SpO2 100 %.Body mass index is 41.8 kg/m.  General Appearance: Casual  Eye Contact:  Good  Speech:  Normal Rate  Volume:  Normal  Mood:  Euthymic  Affect:  Congruent  Thought Process:  Coherent and Descriptions of Associations: Intact  Orientation:  Full (Time, Place, and Person)  Thought Content:  WDL and Logical  Suicidal Thoughts:  No  Homicidal Thoughts:  No  Memory:  Immediate;   Good Recent;   Good Remote;   Good  Judgement:  Fair  Insight:  Fair  Psychomotor Activity:  Normal  Concentration:  Concentration: Good and Attention Span: Good  Recall:  Good  Fund of Knowledge:  Fair  Language:  Good   Akathisia:  No  Handed:  Right  AIMS (if indicated):     Assets:  Housing Leisure Time Physical Health Resilience Social Support  ADL's:  Intact  Cognition:  WNL  Sleep:       Mental Status Per Nursing Assessment::   On Admission:   psychosis/confusion  Demographic Factors:  Adolescent or young adult  Loss Factors: NA  Historical Factors: NA  Risk Reduction Factors:   Responsible for children under 26 years of age, Sense of responsibility to family, Living with another person, especially a relative, Positive social support and Positive therapeutic relationship  Continued Clinical Symptoms:  None  Cognitive Features That Contribute To Risk:  None    Suicide Risk:  Minimal: No identifiable suicidal ideation.  Patients presenting with no risk factors but with morbid ruminations; may be classified as minimal risk based on the severity of the depressive symptoms    Plan Of Care/Follow-up recommendations:  Activity:  as tolerated Diet:  heart healthy diet  LORD, JAMISON, NP 10/06/2016, 10:32 AM

## 2016-11-08 ENCOUNTER — Emergency Department (HOSPITAL_COMMUNITY)
Admission: EM | Admit: 2016-11-08 | Discharge: 2016-11-09 | Disposition: A | Discharge status: Home / Self Care | Payer: Medicaid Other | Attending: Emergency Medicine | Admitting: Emergency Medicine

## 2016-11-08 ENCOUNTER — Encounter (HOSPITAL_COMMUNITY): Payer: Self-pay | Admitting: Vascular Surgery

## 2016-11-08 DIAGNOSIS — F419 Anxiety disorder, unspecified: Secondary | ICD-10-CM | POA: Diagnosis present

## 2016-11-08 DIAGNOSIS — R450 Nervousness: Secondary | ICD-10-CM | POA: Diagnosis not present

## 2016-11-08 DIAGNOSIS — F17290 Nicotine dependence, other tobacco product, uncomplicated: Secondary | ICD-10-CM | POA: Insufficient documentation
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
## 2016-11-09 ENCOUNTER — Emergency Department (HOSPITAL_COMMUNITY)
Admission: EM | Admit: 2016-11-09 | Discharge: 2016-11-09 | Disposition: A | Discharge status: Home / Self Care | Payer: Medicaid Other | Source: Home / Self Care

## 2016-11-09 ENCOUNTER — Encounter (HOSPITAL_COMMUNITY): Payer: Self-pay | Admitting: Emergency Medicine

## 2016-11-09 DIAGNOSIS — Z79899 Other long term (current) drug therapy: Secondary | ICD-10-CM | POA: Insufficient documentation

## 2016-11-09 DIAGNOSIS — F17290 Nicotine dependence, other tobacco product, uncomplicated: Secondary | ICD-10-CM

## 2016-11-09 DIAGNOSIS — F419 Anxiety disorder, unspecified: Secondary | ICD-10-CM

## 2016-11-09 DIAGNOSIS — Z5321 Procedure and treatment not carried out due to patient leaving prior to being seen by health care provider: Secondary | ICD-10-CM

## 2016-11-09 NOTE — ED Notes (Signed)
Patient stated she feels better and does not want to stay. Patient advised to stay to get evaluated, patient refused and left.

## 2016-11-09 NOTE — ED Notes (Signed)
Pt did not come back to [facility]. She reportedly, left from the lobby.

## 2016-11-09 NOTE — ED Notes (Signed)
Pt stable, understands discharge instructions, and reasons for return.   
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
## 2016-11-09 NOTE — ED Triage Notes (Addendum)
Ambulatory to triage via ems states is anxious and cannot get to her meds, was seen yesterday for same, told ems she  Was moving out from moms house and it all got to be to much , pt aaox4
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
## 2016-11-14 ENCOUNTER — Encounter (HOSPITAL_COMMUNITY): Payer: Self-pay | Admitting: Emergency Medicine

## 2016-11-14 ENCOUNTER — Emergency Department (HOSPITAL_COMMUNITY)
Admission: EM | Admit: 2016-11-14 | Discharge: 2016-11-14 | Disposition: A | Discharge status: Home / Self Care | Payer: Medicaid Other | Attending: Emergency Medicine | Admitting: Emergency Medicine

## 2016-11-14 DIAGNOSIS — M542 Cervicalgia: Secondary | ICD-10-CM | POA: Insufficient documentation

## 2016-11-14 DIAGNOSIS — Z5321 Procedure and treatment not carried out due to patient leaving prior to being seen by health care provider: Secondary | ICD-10-CM | POA: Insufficient documentation

## 2016-11-14 LAB — COMPREHENSIVE METABOLIC PANEL
ALBUMIN: 3.6 g/dL (ref 3.5–5.0)
ALK PHOS: 54 U/L (ref 38–126)
ALT: 18 U/L (ref 14–54)
ANION GAP: 5 (ref 5–15)
AST: 20 U/L (ref 15–41)
BUN: 5 mg/dL — ABNORMAL LOW (ref 6–20)
CHLORIDE: 107 mmol/L (ref 101–111)
CO2: 28 mmol/L (ref 22–32)
Calcium: 8.9 mg/dL (ref 8.9–10.3)
Creatinine, Ser: 0.87 mg/dL (ref 0.44–1.00)
GFR calc non Af Amer: >60 mL/min (ref >60)
GLUCOSE: 77 mg/dL (ref 65–99)
POTASSIUM: 3.3 mmol/L — AB (ref 3.5–5.1)
SODIUM: 140 mmol/L (ref 135–145)
Total Bilirubin: 0.2 mg/dL — ABNORMAL LOW (ref 0.3–1.2)
Total Protein: 6.7 g/dL (ref 6.5–8.1)

## 2016-11-14 LAB — CBC
HEMATOCRIT: 38.6 % (ref 36.0–46.0)
Hemoglobin: 13.4 g/dL (ref 12.0–15.0)
MCH: 28.2 pg (ref 26.0–34.0)
MCHC: 34.7 g/dL (ref 30.0–36.0)
MCV: 81.3 fL (ref 78.0–100.0)
Platelets: 318 10*3/uL (ref 150–400)
RBC: 4.75 MIL/uL (ref 3.87–5.11)
RDW: 13.2 % (ref 11.5–15.5)
WBC: 9.9 10*3/uL (ref 4.0–10.5)

## 2016-11-14 LAB — URINALYSIS, ROUTINE W REFLEX MICROSCOPIC
Bilirubin Urine: NEGATIVE
GLUCOSE, UA: NEGATIVE mg/dL
KETONES UR: NEGATIVE mg/dL
Leukocytes, UA: NEGATIVE
Nitrite: POSITIVE — AB
PROTEIN: NEGATIVE mg/dL
Specific Gravity, Urine: 1.012 (ref 1.005–1.030)
pH: 6 (ref 5.0–8.0)

## 2016-11-14 LAB — I-STAT BETA HCG BLOOD, ED (MC, WL, AP ONLY)

## 2016-11-14 LAB — LIPASE, BLOOD: Lipase: 25 U/L (ref 11–51)

## 2016-11-14 NOTE — ED Notes (Signed)
Pt states she needs to leave for an appointment at 6pm. RN advised against leaving but pt states she is going to leave

## 2016-11-14 NOTE — ED Triage Notes (Signed)
Per EMS: pt sts left back and flank pain starting today

## 2016-11-22 ENCOUNTER — Emergency Department (HOSPITAL_COMMUNITY)
Admission: EM | Admit: 2016-11-22 | Discharge: 2016-11-22 | Disposition: A | Discharge status: Home / Self Care | Payer: Medicaid Other | Attending: Emergency Medicine | Admitting: Emergency Medicine

## 2016-11-22 ENCOUNTER — Emergency Department (HOSPITAL_COMMUNITY): Payer: Medicaid Other

## 2016-11-22 DIAGNOSIS — F17290 Nicotine dependence, other tobacco product, uncomplicated: Secondary | ICD-10-CM | POA: Insufficient documentation

## 2016-11-22 DIAGNOSIS — Y929 Unspecified place or not applicable: Secondary | ICD-10-CM | POA: Diagnosis not present

## 2016-11-22 DIAGNOSIS — Y999 Unspecified external cause status: Secondary | ICD-10-CM | POA: Insufficient documentation

## 2016-11-22 DIAGNOSIS — Y939 Activity, unspecified: Secondary | ICD-10-CM | POA: Insufficient documentation

## 2016-11-22 DIAGNOSIS — S9032XA Contusion of left foot, initial encounter: Secondary | ICD-10-CM | POA: Diagnosis not present

## 2016-11-22 DIAGNOSIS — W501XXA Accidental kick by another person, initial encounter: Secondary | ICD-10-CM | POA: Diagnosis not present

## 2016-11-22 DIAGNOSIS — S99922A Unspecified injury of left foot, initial encounter: Secondary | ICD-10-CM | POA: Diagnosis present

## 2016-11-22 LAB — COMPREHENSIVE METABOLIC PANEL
ALBUMIN: 3.9 g/dL (ref 3.5–5.0)
ALK PHOS: 54 U/L (ref 38–126)
ALT: 18 U/L (ref 14–54)
AST: 16 U/L (ref 15–41)
Anion gap: 7 (ref 5–15)
BILIRUBIN TOTAL: 0.5 mg/dL (ref 0.3–1.2)
BUN: 10 mg/dL (ref 6–20)
CALCIUM: 8.9 mg/dL (ref 8.9–10.3)
CO2: 25 mmol/L (ref 22–32)
Chloride: 108 mmol/L (ref 101–111)
Creatinine, Ser: 0.86 mg/dL (ref 0.44–1.00)
GFR calc Af Amer: >60 mL/min (ref >60)
GLUCOSE: 100 mg/dL — AB (ref 65–99)
Potassium: 3.7 mmol/L (ref 3.5–5.1)
SODIUM: 140 mmol/L (ref 135–145)
TOTAL PROTEIN: 6.8 g/dL (ref 6.5–8.1)

## 2016-11-22 LAB — URINALYSIS, ROUTINE W REFLEX MICROSCOPIC
GLUCOSE, UA: NEGATIVE mg/dL
Hgb urine dipstick: NEGATIVE
KETONES UR: 5 mg/dL — AB
Leukocytes, UA: NEGATIVE
Nitrite: NEGATIVE
PH: 5 (ref 5.0–8.0)
PROTEIN: 30 mg/dL — AB
Specific Gravity, Urine: 1.041 — ABNORMAL HIGH (ref 1.005–1.030)

## 2016-11-22 LAB — CBC WITH DIFFERENTIAL/PLATELET
BASOS PCT: 1 %
Basophils Absolute: 0 10*3/uL (ref 0.0–0.1)
EOS ABS: 0.3 10*3/uL (ref 0.0–0.7)
EOS PCT: 3 %
HCT: 37.2 % (ref 36.0–46.0)
HEMOGLOBIN: 12.9 g/dL (ref 12.0–15.0)
LYMPHS ABS: 3 10*3/uL (ref 0.7–4.0)
Lymphocytes Relative: 35 %
MCH: 28 pg (ref 26.0–34.0)
MCHC: 34.7 g/dL (ref 30.0–36.0)
MCV: 80.7 fL (ref 78.0–100.0)
MONOS PCT: 6 %
Monocytes Absolute: 0.5 10*3/uL (ref 0.1–1.0)
NEUTROS PCT: 55 %
Neutro Abs: 4.8 10*3/uL (ref 1.7–7.7)
PLATELETS: 282 10*3/uL (ref 150–400)
RBC: 4.61 MIL/uL (ref 3.87–5.11)
RDW: 13.6 % (ref 11.5–15.5)
WBC: 8.6 10*3/uL (ref 4.0–10.5)

## 2016-11-22 LAB — POC URINE PREG, ED: Preg Test, Ur: NEGATIVE

## 2016-11-22 MED ORDER — ACETAMINOPHEN 325 MG PO TABS
650.0000 mg | ORAL_TABLET | Freq: Once | ORAL | Status: AC
  Administered 2016-11-22: 650 mg via ORAL
  Filled 2016-11-22: qty 2

## 2016-11-22 MED ORDER — SODIUM CHLORIDE 0.9 % IV BOLUS (SEPSIS)
1000.0000 mL | Freq: Once | INTRAVENOUS | Status: AC
  Administered 2016-11-22: 1000 mL via INTRAVENOUS

## 2016-11-22 NOTE — ED Provider Notes (Signed)
[facility] Provider Note   CSN: [id number] Arrival date & time: 11/22/16  4098     History   Chief Complaint Chief Complaint  Patient presents with  . Foot Pain    HPI Kelly Crosby is a 26 y.o. female.  HPI  26 year old female presents with multiple complaints. Her primary complaint is left foot pain. She states that she has second toe pain on her plantar foot. She states that she is homeless and walks around some of. She does not room number distinct injury. She is also concerned she is pregnant because she missed her Depo shot in March. She has not had a menstrual cycle for several months. She states she has gained and lost weight a lot over the last several months. She has been having increased smell and hearing sensitivity. No nausea or vomiting. No vaginal bleeding or discharge. Denies urinary complaints.   Past Medical History:  Diagnosis Date  . Anxiety   . Depression     Patient Active Problem List   Diagnosis Date Noted  . Schizoaffective disorder (HCC) 09/26/2016  . Cannabis use disorder, severe, dependence (HCC) 09/26/2016    Past Surgical History:  Procedure Laterality Date  . NO PAST SURGERIES      OB History    No data available       Home Medications    Prior to Admission medications   Medication Sig Start Date End Date Taking? Authorizing Provider  ARIPiprazole (ABILIFY) 5 MG tablet Take 1 tablet (5 mg total) by mouth 2 (two) times daily. 10/06/16   Charm Rings, NP  carbamazepine (TEGRETOL XR) 200 MG 12 hr tablet Take 1 tablet (200 mg total) by mouth 2 (two) times daily. 10/06/16   Charm Rings, NP  hydrOXYzine (ATARAX/VISTARIL) 25 MG tablet Take 1 tablet (25 mg total) by mouth every 6 (six) hours as needed for anxiety. Patient not taking: Reported on 10/05/2016 09/30/16   Adonis Brook, NP  OLANZapine (ZYPREXA) 5 MG tablet Take 1 tablet (5 mg total) by mouth at bedtime. 09/30/16   Adonis Brook, NP    Family History Family History    Problem Relation Age of Onset  . Hypertension Other     Social History Social History  Substance Use Topics  . Smoking status: Current Some Day Smoker    Types: Cigars  . Smokeless tobacco: Never Used  . Alcohol use No     Allergies   Amoxicillin   Review of Systems Review of Systems  Constitutional: Negative for fever.  Respiratory: Negative for shortness of breath.   Cardiovascular: Negative for chest pain.  Gastrointestinal: Positive for abdominal pain. Negative for vomiting.  Genitourinary: Positive for menstrual problem. Negative for vaginal bleeding and vaginal discharge.  Musculoskeletal: Positive for arthralgias.  All other systems reviewed and are negative.    Physical Exam Updated Vital Signs BP 122/81   Pulse 60   Temp 98 F (36.7 C) (Oral)   Resp 16   Ht  (1.676 m)   Wt 240 lb (108.9 kg)   SpO2 100%   BMI 38.74 kg/m   Physical Exam  Constitutional: She is oriented to person, place, and time. She appears well-developed and well-nourished.  HENT:  Head: Normocephalic and atraumatic.  Right Ear: External ear normal.  Left Ear: External ear normal.  Nose: Nose normal.  Eyes: Right eye exhibits no discharge. Left eye exhibits no discharge.  Cardiovascular: Normal rate, regular rhythm and normal heart sounds.   Pulses:  Dorsalis pedis pulses are 2+ on the right side, and 2+ on the left side.  Pulmonary/Chest: Effort normal and breath sounds normal.  Abdominal: Soft. There is tenderness.  Vague lower abdominal tenderness. No scars. When distracted palpation does not appear to cause pain  Musculoskeletal: She exhibits no edema or deformity.       Right ankle: No tenderness.       Left ankle: No tenderness.       Right foot: There is no tenderness and no swelling.       Left foot: There is tenderness. There is no swelling.       Feet:  Neurological: She is alert and oriented to person, place, and time.  Skin: Skin is warm and dry.   Nursing note and vitals reviewed.    ED Treatments / Results  Labs (all labs ordered are listed, but only abnormal results are displayed) Labs Reviewed  URINALYSIS, ROUTINE W REFLEX MICROSCOPIC - Abnormal; Notable for the following:       Result Value   Specific Gravity, Urine 1.041 (*)    Bilirubin Urine SMALL (*)    Ketones, ur 5 (*)    Protein, ur 30 (*)    Bacteria, UA RARE (*)    Squamous Epithelial / LPF 0-5 (*)    All other components within normal limits  COMPREHENSIVE METABOLIC PANEL - Abnormal; Notable for the following:    Glucose, Bld 100 (*)    All other components within normal limits  CBC WITH DIFFERENTIAL/PLATELET  POC URINE PREG, ED    EKG  EKG Interpretation None       Radiology Dg Foot Complete Left  Result Date: 11/22/2016 CLINICAL DATA:  Second toe pain for 2 weeks EXAM: LEFT FOOT - COMPLETE 3+ VIEW COMPARISON:  None. FINDINGS: There is no evidence of fracture or dislocation. There is no evidence of arthropathy or other focal bone abnormality. Soft tissues are unremarkable. IMPRESSION: Negative. Electronically Signed   By: Deatra Robinson M.D.   On: 11/22/2016 06:48    Procedures Procedures (including critical care time)  Medications Ordered in ED Medications  acetaminophen (TYLENOL) tablet 650 mg (650 mg Oral Given 11/22/16 0615)  sodium chloride 0.9 % bolus 1,000 mL (0 mLs Intravenous Stopped 11/22/16 0709)     Initial Impression / Assessment and Plan / ED Course  I have reviewed the triage vital signs and the nursing notes.  Pertinent labs & imaging results that were available during my care of the patient were reviewed by me and considered in my medical decision making (see chart for details).     Patient is neurovascularly intact. X-ray without fracture. With mild contusion. Ice, elevation, NSAIDs. As for her abdominal pain and there appears to be no significant pathology as her abdominal exam shows no reproducible tenderness on multiple  exams. She is not pregnant. Urine and basic labs are unremarkable. Later she tells me that she has not eaten since earlier in the day rather than for several days. Vital signs unremarkable. Appears stable for discharge.  Final Clinical Impressions(s) / ED Diagnoses   Final diagnoses:  Contusion of left foot, initial encounter    New Prescriptions New Prescriptions   No medications on file     Pricilla Loveless, MD 11/22/16 [phone]

## 2016-11-22 NOTE — ED Notes (Signed)
Patient transported to X-ray 
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
## 2016-11-23 ENCOUNTER — Encounter (HOSPITAL_COMMUNITY): Payer: Self-pay

## 2016-11-23 ENCOUNTER — Emergency Department (HOSPITAL_COMMUNITY)
Admission: EM | Admit: 2016-11-23 | Discharge: 2016-11-24 | Disposition: A | Discharge status: Home / Self Care | Payer: Medicaid Other | Attending: Emergency Medicine | Admitting: Emergency Medicine

## 2016-11-23 DIAGNOSIS — Y929 Unspecified place or not applicable: Secondary | ICD-10-CM | POA: Diagnosis not present

## 2016-11-23 DIAGNOSIS — Y999 Unspecified external cause status: Secondary | ICD-10-CM | POA: Insufficient documentation

## 2016-11-23 DIAGNOSIS — S90821A Blister (nonthermal), right foot, initial encounter: Secondary | ICD-10-CM | POA: Diagnosis not present

## 2016-11-23 DIAGNOSIS — I10 Essential (primary) hypertension: Secondary | ICD-10-CM | POA: Insufficient documentation

## 2016-11-23 DIAGNOSIS — Y939 Activity, unspecified: Secondary | ICD-10-CM | POA: Insufficient documentation

## 2016-11-23 DIAGNOSIS — S90822A Blister (nonthermal), left foot, initial encounter: Secondary | ICD-10-CM | POA: Diagnosis not present

## 2016-11-23 DIAGNOSIS — F17290 Nicotine dependence, other tobacco product, uncomplicated: Secondary | ICD-10-CM | POA: Insufficient documentation

## 2016-11-23 DIAGNOSIS — X58XXXA Exposure to other specified factors, initial encounter: Secondary | ICD-10-CM | POA: Diagnosis not present

## 2016-11-23 DIAGNOSIS — Z79899 Other long term (current) drug therapy: Secondary | ICD-10-CM | POA: Insufficient documentation

## 2016-11-23 HISTORY — DX: Unspecified atrial fibrillation: I48.91

## 2016-11-23 HISTORY — DX: Genetic susceptibility to other disease: Z15.89

## 2016-11-23 MED ORDER — HYDROCERIN EX CREA
TOPICAL_CREAM | Freq: Two times a day (BID) | CUTANEOUS | Status: DC
  Administered 2016-11-24: 1 via TOPICAL
  Filled 2016-11-23: qty 113

## 2016-11-23 NOTE — ED Provider Notes (Signed)
[facility]-EMERGENCY DEPT Provider Note   CSN: [id number] Arrival date & time: 11/23/16  2156  By signing my name below, I, Elder Negus, attest that this documentation has been prepared under the direction and in the presence of Derwood Kaplan, MD. Electronically Signed: Elder Negus, Scribe. 11/23/16. 11:13 PM.   History   Chief Complaint Chief Complaint  Patient presents with  . Headache    HPI Kelly Crosby is a 26 y.o. female with history of hypertension and homelessness who presents to the ED for evaluation of foot pain. The patient states that she is currently staying at her sister's house, previous night was at the [facility] shelter. She has otherwise been outside walking in the rain. She states that her feet, shoes, and socks have gotten wet. Recently she had developed pain over both feet when are worse when walking. She denies any rashes or fever.  The history is provided by the patient. No language interpreter was used.    Past Medical History:  Diagnosis Date  . A-fib (HCC)   . Anxiety   . Depression   . Schizotaxia     Patient Active Problem List   Diagnosis Date Noted  . Schizoaffective disorder (HCC) 09/26/2016  . Cannabis use disorder, severe, dependence (HCC) 09/26/2016    Past Surgical History:  Procedure Laterality Date  . NO PAST SURGERIES      OB History    No data available       Home Medications    Prior to Admission medications   Medication Sig Start Date End Date Taking? Authorizing Provider  ARIPiprazole (ABILIFY) 5 MG tablet Take 1 tablet (5 mg total) by mouth 2 (two) times daily. 10/06/16   Charm Rings, NP  carbamazepine (TEGRETOL XR) 200 MG 12 hr tablet Take 1 tablet (200 mg total) by mouth 2 (two) times daily. 10/06/16   Charm Rings, NP  hydrOXYzine (ATARAX/VISTARIL) 25 MG tablet Take 1 tablet (25 mg total) by mouth every 6 (six) hours as needed for anxiety. Patient not taking: Reported on 10/05/2016 09/30/16   Adonis Brook, NP    OLANZapine (ZYPREXA) 5 MG tablet Take 1 tablet (5 mg total) by mouth at bedtime. 09/30/16   Adonis Brook, NP    Family History Family History  Problem Relation Age of Onset  . Hypertension Other     Social History Social History  Substance Use Topics  . Smoking status: Current Some Day Smoker    Types: Cigars  . Smokeless tobacco: Never Used  . Alcohol use No     Allergies   Amoxicillin   Review of Systems Review of Systems  Constitutional: Negative for fever.  Respiratory: Negative for shortness of breath.   Cardiovascular: Negative for chest pain.  Skin: Negative for rash.       Foot skin problem with pain per HPI  All other systems reviewed and are negative.    Physical Exam Updated Vital Signs BP 120/75 (BP Location: Left Arm)   Pulse 77   Temp 98.3 F (36.8 C) (Oral)   Resp 18   Ht  (1.676 m)   Wt 230 lb (104.3 kg)   SpO2 97%   BMI 37.12 kg/m   Physical Exam  Constitutional: She is oriented to person, place, and time. She appears well-developed and well-nourished.  HENT:  Head: Normocephalic and atraumatic.  Cardiovascular: Normal rate.   Pulmonary/Chest: Effort normal.  Neurological: She is alert and oriented to person, place, and time.  Skin: Skin is  warm and dry.  There is scaling to the plantar surface of feet. In the L foot, there is callus formation at the base of the 2nd toe. Between some of the toes the dermis appears to blistered, however no evidece of infection, purulence or drainage.  Psychiatric: She has a normal mood and affect.  Nursing note and vitals reviewed.    ED Treatments / Results  Labs (all labs ordered are listed, but only abnormal results are displayed) Labs Reviewed - No data to display  EKG  EKG Interpretation None       Radiology Dg Foot Complete Left  Result Date: 11/22/2016 CLINICAL DATA:  Second toe pain for 2 weeks EXAM: LEFT FOOT - COMPLETE 3+ VIEW COMPARISON:  None. FINDINGS: There is no  evidence of fracture or dislocation. There is no evidence of arthropathy or other focal bone abnormality. Soft tissues are unremarkable. IMPRESSION: Negative. Electronically Signed   By: Deatra Robinson M.D.   On: 11/22/2016 06:48    Procedures Procedures (including critical care time)  Medications Ordered in ED Medications  hydrocerin (EUCERIN) cream (not administered)     Initial Impression / Assessment and Plan / ED Course  I have reviewed the triage vital signs and the nursing notes.  Pertinent labs & imaging results that were available during my care of the patient were reviewed by me and considered in my medical decision making (see chart for details).    Blistered feet due to walking with wet shoes and friction. There is fortunately no serious skin compromise or signs of infection. D/C. Socks provided.   Final Clinical Impressions(s) / ED Diagnoses   Final diagnoses:  Friction blisters of sole of left foot, initial encounter  Friction blister of the foot, right, initial encounter    New Prescriptions New Prescriptions   No medications on file  I personally performed the services described in this documentation, which was scribed in my presence. The recorded information has been reviewed and is accurate.    Derwood Kaplan, MD 11/23/16 [phone]

## 2016-11-23 NOTE — Discharge Instructions (Signed)
Keep the feet clean and dry. Apply eucerin cream at night after washing the feet.

## 2016-11-23 NOTE — ED Triage Notes (Signed)
States headache and feet swelling and hurting all over for a couple of days.

## 2016-11-24 MED ORDER — ACETAMINOPHEN 325 MG PO TABS
650.0000 mg | ORAL_TABLET | Freq: Once | ORAL | Status: AC
  Administered 2016-11-24: 650 mg via ORAL
  Filled 2016-11-24: qty 2

## 2016-12-01 ENCOUNTER — Encounter (HOSPITAL_COMMUNITY): Payer: Self-pay | Admitting: Emergency Medicine

## 2016-12-01 DIAGNOSIS — N3000 Acute cystitis without hematuria: Secondary | ICD-10-CM | POA: Insufficient documentation

## 2016-12-01 DIAGNOSIS — F17290 Nicotine dependence, other tobacco product, uncomplicated: Secondary | ICD-10-CM | POA: Insufficient documentation

## 2016-12-01 DIAGNOSIS — Z79899 Other long term (current) drug therapy: Secondary | ICD-10-CM | POA: Diagnosis not present

## 2016-12-01 DIAGNOSIS — R079 Chest pain, unspecified: Secondary | ICD-10-CM | POA: Diagnosis present

## 2016-12-01 DIAGNOSIS — R1084 Generalized abdominal pain: Secondary | ICD-10-CM | POA: Diagnosis not present

## 2016-12-01 LAB — URINALYSIS, ROUTINE W REFLEX MICROSCOPIC
Bilirubin Urine: NEGATIVE
Glucose, UA: NEGATIVE mg/dL
KETONES UR: NEGATIVE mg/dL
Leukocytes, UA: NEGATIVE
Nitrite: POSITIVE — AB
PH: 6 (ref 5.0–8.0)
Protein, ur: NEGATIVE mg/dL
SPECIFIC GRAVITY, URINE: 1.016 (ref 1.005–1.030)

## 2016-12-01 LAB — I-STAT BETA HCG BLOOD, ED (MC, WL, AP ONLY)
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
## 2016-12-02 ENCOUNTER — Emergency Department (HOSPITAL_COMMUNITY)
Admission: EM | Admit: 2016-12-02 | Discharge: 2016-12-02 | Disposition: A | Discharge status: Home / Self Care | Payer: Medicaid Other | Attending: Emergency Medicine | Admitting: Emergency Medicine

## 2016-12-02 ENCOUNTER — Emergency Department (HOSPITAL_COMMUNITY): Payer: Medicaid Other

## 2016-12-02 DIAGNOSIS — R1084 Generalized abdominal pain: Secondary | ICD-10-CM

## 2016-12-02 DIAGNOSIS — N3000 Acute cystitis without hematuria: Secondary | ICD-10-CM

## 2016-12-02 LAB — COMPREHENSIVE METABOLIC PANEL
ALT: 17 U/L (ref 14–54)
ANION GAP: 6 (ref 5–15)
AST: 16 U/L (ref 15–41)
Albumin: 3.8 g/dL (ref 3.5–5.0)
Alkaline Phosphatase: 53 U/L (ref 38–126)
BILIRUBIN TOTAL: 0.3 mg/dL (ref 0.3–1.2)
BUN: 8 mg/dL (ref 6–20)
CALCIUM: 9.1 mg/dL (ref 8.9–10.3)
CO2: 26 mmol/L (ref 22–32)
Chloride: 107 mmol/L (ref 101–111)
Creatinine, Ser: 0.91 mg/dL (ref 0.44–1.00)
GFR calc Af Amer: >60 mL/min (ref >60)
Glucose, Bld: 95 mg/dL (ref 65–99)
POTASSIUM: 3.9 mmol/L (ref 3.5–5.1)
Sodium: 139 mmol/L (ref 135–145)
TOTAL PROTEIN: 6.7 g/dL (ref 6.5–8.1)

## 2016-12-02 LAB — CBC
HCT: 38.9 % (ref 36.0–46.0)
HEMOGLOBIN: 13.4 g/dL (ref 12.0–15.0)
MCH: 27.9 pg (ref 26.0–34.0)
MCHC: 34.4 g/dL (ref 30.0–36.0)
MCV: 80.9 fL (ref 78.0–100.0)
Platelets: 317 10*3/uL (ref 150–400)
RBC: 4.81 MIL/uL (ref 3.87–5.11)
RDW: 13.2 % (ref 11.5–15.5)
WBC: 11.6 10*3/uL — ABNORMAL HIGH (ref 4.0–10.5)

## 2016-12-02 MED ORDER — CEPHALEXIN 500 MG PO CAPS: 1000.0000 mg | ORAL_CAPSULE | Freq: Two times a day (BID) | ORAL | 0 refills | Status: DC

## 2016-12-02 MED ORDER — SODIUM CHLORIDE 0.9 % IV BOLUS (SEPSIS)
1000.0000 mL | Freq: Once | INTRAVENOUS | Status: AC
  Administered 2016-12-02: 1000 mL via INTRAVENOUS

## 2016-12-02 NOTE — Discharge Instructions (Signed)
Return here as needed. You have a UTI. Your CT scan is negative
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
## 2016-12-28 ENCOUNTER — Emergency Department (HOSPITAL_COMMUNITY)
Admission: EM | Admit: 2016-12-28 | Discharge: 2016-12-28 | Disposition: A | Discharge status: Home / Self Care | Payer: Medicaid Other | Attending: Emergency Medicine | Admitting: Emergency Medicine

## 2016-12-28 ENCOUNTER — Encounter (HOSPITAL_COMMUNITY): Payer: Self-pay | Admitting: Emergency Medicine

## 2016-12-28 ENCOUNTER — Emergency Department (HOSPITAL_COMMUNITY)
Admission: EM | Admit: 2016-12-28 | Discharge: 2016-12-28 | Disposition: A | Discharge status: Home / Self Care | Payer: Medicaid Other | Source: Home / Self Care | Attending: Emergency Medicine | Admitting: Emergency Medicine

## 2016-12-28 DIAGNOSIS — N390 Urinary tract infection, site not specified: Secondary | ICD-10-CM | POA: Insufficient documentation

## 2016-12-28 DIAGNOSIS — F419 Anxiety disorder, unspecified: Secondary | ICD-10-CM

## 2016-12-28 DIAGNOSIS — F17290 Nicotine dependence, other tobacco product, uncomplicated: Secondary | ICD-10-CM | POA: Diagnosis not present

## 2016-12-28 DIAGNOSIS — Z79899 Other long term (current) drug therapy: Secondary | ICD-10-CM | POA: Insufficient documentation

## 2016-12-28 DIAGNOSIS — Z5900 Homelessness unspecified: Secondary | ICD-10-CM

## 2016-12-28 DIAGNOSIS — F341 Dysthymic disorder: Secondary | ICD-10-CM

## 2016-12-28 DIAGNOSIS — F329 Major depressive disorder, single episode, unspecified: Secondary | ICD-10-CM | POA: Diagnosis present

## 2016-12-28 LAB — URINALYSIS, ROUTINE W REFLEX MICROSCOPIC
Bilirubin Urine: NEGATIVE
GLUCOSE, UA: NEGATIVE mg/dL
Ketones, ur: NEGATIVE mg/dL
Leukocytes, UA: NEGATIVE
Nitrite: POSITIVE — AB
PH: 5 (ref 5.0–8.0)
Protein, ur: NEGATIVE mg/dL
Specific Gravity, Urine: 1.019 (ref 1.005–1.030)

## 2016-12-28 LAB — BASIC METABOLIC PANEL
Anion gap: 7 (ref 5–15)
BUN: 7 mg/dL (ref 6–20)
CALCIUM: 8.7 mg/dL — AB (ref 8.9–10.3)
CO2: 24 mmol/L (ref 22–32)
CREATININE: 0.83 mg/dL (ref 0.44–1.00)
Chloride: 106 mmol/L (ref 101–111)
GFR calc non Af Amer: >60 mL/min (ref >60)
Glucose, Bld: 96 mg/dL (ref 65–99)
Potassium: 3.9 mmol/L (ref 3.5–5.1)
SODIUM: 137 mmol/L (ref 135–145)

## 2016-12-28 LAB — CBC WITH DIFFERENTIAL/PLATELET
BASOS PCT: 0 %
Basophils Absolute: 0 10*3/uL (ref 0.0–0.1)
EOS ABS: 0.1 10*3/uL (ref 0.0–0.7)
Eosinophils Relative: 1 %
HCT: 38 % (ref 36.0–46.0)
HEMOGLOBIN: 13.2 g/dL (ref 12.0–15.0)
Lymphocytes Relative: 22 %
Lymphs Abs: 2.2 10*3/uL (ref 0.7–4.0)
MCH: 28 pg (ref 26.0–34.0)
MCHC: 34.7 g/dL (ref 30.0–36.0)
MCV: 80.5 fL (ref 78.0–100.0)
MONOS PCT: 6 %
Monocytes Absolute: 0.6 10*3/uL (ref 0.1–1.0)
NEUTROS PCT: 70 %
Neutro Abs: 7.1 10*3/uL (ref 1.7–7.7)
Platelets: 293 10*3/uL (ref 150–400)
RBC: 4.72 MIL/uL (ref 3.87–5.11)
RDW: 13 % (ref 11.5–15.5)
WBC: 10 10*3/uL (ref 4.0–10.5)

## 2016-12-28 LAB — RAPID URINE DRUG SCREEN, HOSP PERFORMED
AMPHETAMINES: NOT DETECTED
Barbiturates: NOT DETECTED
Benzodiazepines: NOT DETECTED
Cocaine: NOT DETECTED
OPIATES: NOT DETECTED
Tetrahydrocannabinol: POSITIVE — AB

## 2016-12-28 LAB — POC URINE PREG, ED: Preg Test, Ur: NEGATIVE

## 2016-12-28 LAB — ETHANOL

## 2016-12-28 MED ORDER — CEPHALEXIN 250 MG PO CAPS
500.0000 mg | ORAL_CAPSULE | Freq: Four times a day (QID) | ORAL | Status: DC
  Administered 2016-12-28: 500 mg via ORAL
  Filled 2016-12-28: qty 2

## 2016-12-28 MED ORDER — IBUPROFEN 400 MG PO TABS: 600.0000 mg | ORAL_TABLET | Freq: Three times a day (TID) | ORAL | Status: DC | PRN

## 2016-12-28 MED ORDER — ACETAMINOPHEN 325 MG PO TABS: 650.0000 mg | ORAL_TABLET | ORAL | Status: DC | PRN

## 2016-12-28 MED ORDER — NICOTINE 21 MG/24HR TD PT24: 21.0000 mg | MEDICATED_PATCH | Freq: Every day | TRANSDERMAL | Status: DC | PRN

## 2016-12-28 MED ORDER — ALUM & MAG HYDROXIDE-SIMETH 200-200-20 MG/5ML PO SUSP: 30.0000 mL | ORAL | Status: DC | PRN

## 2016-12-28 MED ORDER — CEPHALEXIN 500 MG PO CAPS: 500.0000 mg | ORAL_CAPSULE | Freq: Four times a day (QID) | ORAL | 0 refills | Status: DC

## 2016-12-28 MED ORDER — ONDANSETRON HCL 4 MG PO TABS: 4.0000 mg | ORAL_TABLET | Freq: Three times a day (TID) | ORAL | Status: DC | PRN

## 2016-12-28 MED ORDER — ZOLPIDEM TARTRATE 5 MG PO TABS: 5.0000 mg | ORAL_TABLET | Freq: Every evening | ORAL | Status: DC | PRN

## 2016-12-28 NOTE — ED Triage Notes (Signed)
Pt states she is out of her behavioral medications, she is homeless and she started having panic attacks today, denies any thought about hurting her self or others.
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
## 2016-12-28 NOTE — ED Notes (Signed)
Pt understood dc material. And follow up information. NAD Noted

## 2016-12-28 NOTE — ED Notes (Signed)
Patient seen walking out of ED lobby after she was triaged.

## 2016-12-28 NOTE — ED Notes (Signed)
Pt roomed, changed into paper scrubs

## 2016-12-28 NOTE — ED Notes (Signed)
Pt given meal

## 2016-12-28 NOTE — ED Triage Notes (Signed)
Pt. Stated, I 've been depressed and having crying spells.

## 2016-12-28 NOTE — Discharge Instructions (Signed)
Please read attached information. If you experience any new or worsening signs or symptoms please return to the emergency room for evaluation. Please follow-up with your primary care provider or specialist as discussed.  °

## 2016-12-28 NOTE — Discharge Instructions (Signed)
Follow-up for counseling, as recommended.  [facility] to get prescriptions for your psychiatric problems.  For the urinary tract infection take the antibiotic as directed, and drink a lot of fluids.

## 2016-12-28 NOTE — ED Notes (Signed)
ED Provider at bedside. 

## 2016-12-28 NOTE — BHH Counselor (Signed)
Writer notified EDP Effie ShyWentz re: TTS disposition recommendation. Writer then spoke w/ Manuela Schwartzuth RN and faxed homeless resources to [facility] at [phone]. ED will provide list of outpatient resources, and pt was also given outpatient resources when d/c earlier today.   Kelly Crosby, [place] Therapeutic Triage Specialist

## 2016-12-28 NOTE — ED Notes (Signed)
Pt aware of need for urine  

## 2016-12-28 NOTE — ED Provider Notes (Signed)
[facility]-EMERGENCY DEPT Provider Note   CSN: [id number] Arrival date & time: 12/28/16  0104     History   Chief Complaint Chief Complaint  Patient presents with  . Panic Attack    HPI Kelly Crosby is a 26 y.o. female.  HPI   26 year old female presents today with complaints of anxiety. Patient has a significant past medical history of same. She reports that she's been out of her medications for 2 months. Patient notes that she has been doing very well from a mental health standpoint, went out with some friends tonight downtown and had significant stress and anxiety. She reports that she was arguing with her friends and they ended up leaving her. She has been staying at the homeless shelter, but by the time she got back to the shelter they were full and not accepting anyone. Patient reports she had nowhere to go. She notes she feels anxious about life situations. She denies any suicidal or homicidal ideations. She denies any physical complaints including shortness of breath, chest pain.   Past Medical History:  Diagnosis Date  . A-fib (HCC)   . Anxiety   . Depression   . Schizotaxia     Patient Active Problem List   Diagnosis Date Noted  . Schizoaffective disorder (HCC) 09/26/2016  . Cannabis use disorder, severe, dependence (HCC) 09/26/2016    Past Surgical History:  Procedure Laterality Date  . NO PAST SURGERIES      OB History    No data available      Home Medications    Prior to Admission medications   Medication Sig Start Date End Date Taking? Authorizing Provider  ARIPiprazole (ABILIFY) 5 MG tablet Take 1 tablet (5 mg total) by mouth 2 (two) times daily. Patient not taking: Reported on 12/02/2016 10/06/16   Charm RingsLord, Jamison Y, NP  carbamazepine (TEGRETOL XR) 200 MG 12 hr tablet Take 1 tablet (200 mg total) by mouth 2 (two) times daily. Patient not taking: Reported on 12/02/2016 10/06/16   Charm RingsLord, Jamison Y, NP  cephALEXin (KEFLEX) 500 MG capsule Take 2 capsules (1,000  mg total) by mouth 2 (two) times daily. Patient not taking: Reported on 12/28/2016 12/02/16   Charlestine NightLawyer, Christopher, PA-C  hydrOXYzine (ATARAX/VISTARIL) 25 MG tablet Take 1 tablet (25 mg total) by mouth every 6 (six) hours as needed for anxiety. Patient not taking: Reported on 10/05/2016 09/30/16   Adonis BrookAgustin, Sheila, NP  OLANZapine (ZYPREXA) 5 MG tablet Take 1 tablet (5 mg total) by mouth at bedtime. Patient not taking: Reported on 12/02/2016 09/30/16   Adonis BrookAgustin, Sheila, NP    Family History Family History  Problem Relation Age of Onset  . Hypertension Other     Social History Social History  Substance Use Topics  . Smoking status: Current Some Day Smoker    Types: Cigars  . Smokeless tobacco: Never Used  . Alcohol use No     Allergies   Amoxicillin  Review of Systems Review of Systems  All other systems reviewed and are negative.   Physical Exam Updated Vital Signs BP 112/77   Pulse 89   Temp 98 F (36.7 C)   Ht 5\' 6"  (1.676 m)   Wt 230 lb (104.3 kg)   SpO2 99%   BMI 37.12 kg/m    Physical Exam  Constitutional: She is oriented to person, place, and time. She appears well-developed and well-nourished.  HENT:  Head: Normocephalic and atraumatic.  Eyes: Conjunctivae are normal. Pupils are equal, round, and reactive to light.  Right eye exhibits no discharge. Left eye exhibits no discharge. No scleral icterus.  Neck: Normal range of motion. No JVD present. No tracheal deviation present.  Pulmonary/Chest: Effort normal and breath sounds normal. No stridor. No respiratory distress. She has no wheezes. She has no rales. She exhibits no tenderness.  Neurological: She is alert and oriented to person, place, and time. Coordination normal.  Psychiatric: She has a normal mood and affect. Her behavior is normal. Judgment and thought content normal.  Nursing note and vitals reviewed.   ED Treatments / Results  Labs (all labs ordered are listed, but only abnormal results are  displayed) Labs Reviewed - No data to display  EKG  EKG Interpretation None       Radiology No results found.  Procedures Procedures (including critical care time)  Medications Ordered in ED Medications - No data to display   Initial Impression / Assessment and Plan / ED Course  I have reviewed the triage vital signs and the nursing notes.  Pertinent labs & imaging results that were available during my care of the patient were reviewed by me and considered in my medical decision making (see chart for details).      Final Clinical Impressions(s) / ED Diagnoses   Final diagnoses:  Anxiety    Labs:   Imaging:  Consults:  Therapeutics:  Discharge Meds:   Assessment/Plan: 26 year old female presents today with complaints of anxiety. She has a history of the same. She is in no acute distress, and has nowhere to stay which is likely her primary reason for coming to the emergency room. Patient was given food and drink, she will be discharged home with follow-up with behavioral health at [facility]. She is given return precautions she verbalized understanding and agreement to today's plan had no further questions or concerns.   New Prescriptions New Prescriptions   No medications on file     Rosalio Loud 12/28/16 0242    Pricilla Loveless, MD 12/29/16 [phone]

## 2017-07-22 ENCOUNTER — Emergency Department (HOSPITAL_COMMUNITY)
Admission: EM | Admit: 2017-07-22 | Discharge: 2017-07-22 | Disposition: A | Discharge status: Home / Self Care | Payer: Medicaid Other | Attending: Emergency Medicine | Admitting: Emergency Medicine

## 2017-07-22 ENCOUNTER — Other Ambulatory Visit: Payer: Self-pay

## 2017-07-22 ENCOUNTER — Encounter (HOSPITAL_COMMUNITY): Payer: Self-pay

## 2017-07-22 DIAGNOSIS — J039 Acute tonsillitis, unspecified: Secondary | ICD-10-CM | POA: Insufficient documentation

## 2017-07-22 DIAGNOSIS — J029 Acute pharyngitis, unspecified: Secondary | ICD-10-CM | POA: Diagnosis present

## 2017-07-22 DIAGNOSIS — F17290 Nicotine dependence, other tobacco product, uncomplicated: Secondary | ICD-10-CM | POA: Insufficient documentation

## 2017-07-22 DIAGNOSIS — Z79899 Other long term (current) drug therapy: Secondary | ICD-10-CM | POA: Insufficient documentation

## 2017-07-22 LAB — RAPID STREP SCREEN (MED CTR MEBANE ONLY): Streptococcus, Group A Screen (Direct): NEGATIVE

## 2017-07-22 LAB — MONONUCLEOSIS SCREEN: MONO SCREEN: NEGATIVE

## 2017-07-22 MED ORDER — AZITHROMYCIN 250 MG PO TABS: ORAL_TABLET | ORAL | 0 refills | Status: DC

## 2017-07-22 MED ORDER — DEXAMETHASONE 4 MG PO TABS
10.0000 mg | ORAL_TABLET | Freq: Once | ORAL | Status: AC
  Administered 2017-07-22: 23:00:00 10 mg via ORAL
  Filled 2017-07-22: qty 2

## 2017-07-22 MED ORDER — NAPROXEN 375 MG PO TABS: 375.0000 mg | ORAL_TABLET | Freq: Two times a day (BID) | ORAL | 0 refills | Status: DC

## 2017-07-22 MED ORDER — CLINDAMYCIN HCL 300 MG PO CAPS: 300.0000 mg | ORAL_CAPSULE | Freq: Three times a day (TID) | ORAL | 0 refills | Status: DC

## 2017-07-22 NOTE — ED Triage Notes (Signed)
Pt reports that she has a sore throat  8/10 x 3 days, pt reports body aches, HA. Pt took Ibuprofen last night with no relief.

## 2017-07-22 NOTE — Discharge Instructions (Signed)
Take the medication as directed. Return for worsening symptoms.  °

## 2017-07-22 NOTE — ED Provider Notes (Signed)
[facility]-EMERGENCY DEPT Provider Note   CSN: [id number] Arrival date & time: 07/22/17  1938     History   Chief Complaint Chief Complaint  Patient presents with  . Sore Throat    HPI Kelly Crosby is a 26 y.o. female who presents to the ED with a sore throat that started 3 days ago. She has been taking ibuprofen without relief. Patient c/o swollen tonsils and white spots on the tonsils.   HPI  Past Medical History:  Diagnosis Date  . A-fib (HCC)   . Anxiety   . Depression   . Schizotaxia     Patient Active Problem List   Diagnosis Date Noted  . Schizoaffective disorder (HCC) 09/26/2016  . Cannabis use disorder, severe, dependence (HCC) 09/26/2016    Past Surgical History:  Procedure Laterality Date  . NO PAST SURGERIES      OB History    No data available       Home Medications    Prior to Admission medications   Medication Sig Start Date End Date Taking? Authorizing Provider  ARIPiprazole (ABILIFY) 5 MG tablet Take 1 tablet (5 mg total) by mouth 2 (two) times daily. Patient not taking: Reported on 12/02/2016 10/06/16   Charm RingsLord, Jamison Y, NP  carbamazepine (TEGRETOL XR) 200 MG 12 hr tablet Take 1 tablet (200 mg total) by mouth 2 (two) times daily. Patient not taking: Reported on 12/02/2016 10/06/16   Charm RingsLord, Jamison Y, NP  cephALEXin (KEFLEX) 500 MG capsule Take 1 capsule (500 mg total) by mouth 4 (four) times daily. 12/28/16   Mancel BaleWentz, Elliott, MD  clindamycin (CLEOCIN) 300 MG capsule Take 1 capsule (300 mg total) by mouth 3 (three) times daily. 07/22/17   Janne NapoleonNeese, Hope M, NP  naproxen (NAPROSYN) 375 MG tablet Take 1 tablet (375 mg total) by mouth 2 (two) times daily. 07/22/17   Janne NapoleonNeese, Hope M, NP    Family History Family History  Problem Relation Age of Onset  . Hypertension Other     Social History Social History   Tobacco Use  . Smoking status: Current Some Day Smoker    Types: Cigars  . Smokeless tobacco: Never Used  Substance Use  Topics  . Alcohol use: No  . Drug use: Yes    Types: Marijuana     Allergies   Amoxicillin   Review of Systems Review of Systems  Constitutional: Positive for chills. Negative for fever.  HENT: Positive for sore throat. Negative for ear pain.   Eyes: Negative for itching and visual disturbance.  Respiratory: Negative for cough and shortness of breath.   Cardiovascular: Negative for chest pain.  Gastrointestinal: Negative for abdominal pain, nausea and vomiting.  Musculoskeletal: Negative for back pain.  Skin: Negative for rash.  Neurological: Negative for syncope and headaches.  Hematological: Positive for adenopathy.  Psychiatric/Behavioral: Negative for confusion.     Physical Exam Updated Vital Signs BP (!) 131/95 (BP Location: Left Arm)   Pulse (!) 102   Temp 99.1 F (37.3 C) (Oral)   Resp 16   Ht 5\' 5"  (1.651 m)   Wt 104.3 kg (230 lb)   LMP 07/05/2017   SpO2 99%   BMI 38.27 kg/m   Physical Exam  Constitutional: She appears well-developed and well-nourished. No distress.  HENT:  Head: Normocephalic and atraumatic.  Right Ear: Tympanic membrane normal.  Left Ear: Tympanic membrane normal.  Nose: Nose normal.  Mouth/Throat: Uvula is midline and mucous membranes are normal. Posterior oropharyngeal erythema present.  Tonsils are 2+ on the right. Tonsils are 2+ on the left. Tonsillar exudate.  Eyes: Conjunctivae and EOM are normal. Pupils are equal, round, and reactive to light.  Neck: Normal range of motion. Neck supple.  No meningeal signs.  Cardiovascular: Regular rhythm. Tachycardia present.  Pulmonary/Chest: Effort normal.  Abdominal: Soft. There is no tenderness.  Musculoskeletal: Normal range of motion.  Lymphadenopathy:    She has cervical adenopathy.  Neurological: She is alert.  Skin: Skin is warm and dry.  Psychiatric: She has a normal mood and affect. Her behavior is normal.  Nursing note and vitals reviewed.    ED Treatments / Results   Labs (all labs ordered are listed, but only abnormal results are displayed) Labs Reviewed  RAPID STREP SCREEN (NOT AT [facility])  CULTURE, GROUP A STREP [facility]  MONONUCLEOSIS SCREEN   Radiology No results found.  Procedures Procedures (including critical care time)  Medications Ordered in ED Medications  dexamethasone (DECADRON) tablet 10 mg (10 mg Oral Given 07/22/17 2317)   26 y.o. female with sore throat, swollen tonsils with exudate and enlarged cervical lymph nodes stable for d/c without tonsillar abscess. She has a negative mono and rapid strep. Will treat with Clindamycin for exudative tonsillitis. Patient stable for d/c without difficulty swallowing and does not appear toxic. Return precautions discussed.   Initial Impression / Assessment and Plan / ED Course  I have reviewed the triage vital signs and the nursing notes.   Final Clinical Impressions(s) / ED Diagnoses   Final diagnoses:  Tonsillitis with exudate    Rx Clindamycin and Naprosyn   Kerrie Buffaloeese, Hope ClermontM, NP 07/22/17 Ouida [id number]    Rolland PorterJames, Mark, MD 07/30/17 [phone]

## 2017-07-24 LAB — CULTURE, GROUP A STREP (THRC)

## 2018-02-17 NOTE — Congregational Nurse Program (Signed)
Congregational Nurse Program Note  Date of Encounter: 02/17/2018  Past Medical History: Past Medical History:  Diagnosis Date  . A-fib (HCC)   . Anxiety   . Depression   . Schizotaxia     Encounter Details: CNP Questionnaire - 02/17/18 1552      Questionnaire   Patient Status  Not Applicable    Race  Black or African American    Location Patient Served At  Not Applicable    Insurance  Not Applicable    Uninsured  Uninsured (NEW 1x/quarter)    Food  No food insecurities    Housing/Utilities  No permanent housing    Transportation  Within past 12 months, lack of transportation negatively impacted life    Interpersonal Safety  Yes, feel physically and emotionally safe where you currently live    Medication  Yes, have medication insecurities    Medical Provider  No    Referrals  Primary Care Provider/Clinic;Area Agency;[facility]Care Connects    ED Visit Averted  Not Applicable    Life-Saving Intervention Made  Not Applicable     Initial visit for this young lady States she has [facility], was working but had to let the job go because  Her hours were cut . Lost her medicaid ,has a 27 year old daughter but she is presently with mother during the summer. 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 [facility] ,will call August 1,2019 @ 8;30 am . No known health problems she states . Blood pressure was good,counseled regarding smoking and health . Will check in with nurse weekly .

## 2018-04-16 ENCOUNTER — Other Ambulatory Visit: Payer: Self-pay

## 2018-04-16 DIAGNOSIS — F17290 Nicotine dependence, other tobacco product, uncomplicated: Secondary | ICD-10-CM | POA: Insufficient documentation

## 2018-04-16 DIAGNOSIS — J069 Acute upper respiratory infection, unspecified: Secondary | ICD-10-CM | POA: Insufficient documentation

## 2018-04-16 DIAGNOSIS — H9209 Otalgia, unspecified ear: Secondary | ICD-10-CM | POA: Insufficient documentation

## 2018-04-16 DIAGNOSIS — R05 Cough: Secondary | ICD-10-CM | POA: Insufficient documentation

## 2018-04-16 DIAGNOSIS — F1210 Cannabis abuse, uncomplicated: Secondary | ICD-10-CM | POA: Insufficient documentation

## 2018-04-17 ENCOUNTER — Emergency Department (HOSPITAL_COMMUNITY): Payer: Medicaid Other

## 2018-04-17 ENCOUNTER — Encounter (HOSPITAL_COMMUNITY): Payer: Self-pay | Admitting: Emergency Medicine

## 2018-04-17 ENCOUNTER — Emergency Department (HOSPITAL_COMMUNITY)
Admission: EM | Admit: 2018-04-17 | Discharge: 2018-04-17 | Disposition: A | Discharge status: Home / Self Care | Payer: Medicaid Other | Attending: Emergency Medicine | Admitting: Emergency Medicine

## 2018-04-17 ENCOUNTER — Other Ambulatory Visit: Payer: Self-pay

## 2018-04-17 DIAGNOSIS — J069 Acute upper respiratory infection, unspecified: Secondary | ICD-10-CM

## 2018-04-17 MED ORDER — AZITHROMYCIN 250 MG PO TABS: 250.0000 mg | ORAL_TABLET | Freq: Every day | ORAL | 0 refills | Status: DC

## 2018-04-17 NOTE — ED Provider Notes (Signed)
[facility] EMERGENCY DEPARTMENT Provider Note   CSN: [id number] Arrival date & time: 04/16/18  2347     History   Chief Complaint Chief Complaint  Patient presents with  . Cough  . Nasal Congestion  . Otalgia    HPI Kelly Crosby is a 27 y.o. female.  Patient presents with sinus congestion and pressure, cough intermittently productive of yellow phlegm, sore throat, ear pressure. Symptoms started one week ago and have persisted. No known fever but she reports chills. She states multiple family members at home with similar symptoms. She has been taking Sudafed without relief. No nausea, vomiting.   The history is provided by the patient. No language interpreter was used.    Past Medical History:  Diagnosis Date  . A-fib (HCC)   . Anxiety   . Depression   . Schizotaxia     Patient Active Problem List   Diagnosis Date Noted  . Schizoaffective disorder (HCC) 09/26/2016  . Cannabis use disorder, severe, dependence (HCC) 09/26/2016    Past Surgical History:  Procedure Laterality Date  . NO PAST SURGERIES       OB History   None      Home Medications    Prior to Admission medications   Medication Sig Start Date End Date Taking? Authorizing Provider  ARIPiprazole (ABILIFY) 5 MG tablet Take 1 tablet (5 mg total) by mouth 2 (two) times daily. Patient not taking: Reported on 12/02/2016 10/06/16   Charm Rings, NP  carbamazepine (TEGRETOL XR) 200 MG 12 hr tablet Take 1 tablet (200 mg total) by mouth 2 (two) times daily. Patient not taking: Reported on 12/02/2016 10/06/16   Charm Rings, NP  cephALEXin (KEFLEX) 500 MG capsule Take 1 capsule (500 mg total) by mouth 4 (four) times daily. 12/28/16   Mancel Bale, MD  clindamycin (CLEOCIN) 300 MG capsule Take 1 capsule (300 mg total) by mouth 3 (three) times daily. 07/22/17   Janne Napoleon, NP  naproxen (NAPROSYN) 375 MG tablet Take 1 tablet (375 mg total) by mouth 2 (two) times daily. 07/22/17   Janne Napoleon, NP  loratadine (CLARITIN) 10 MG tablet Take 10 mg by mouth daily as needed for allergies.  05/03/15  [provider]    Family History Family History  Problem Relation Age of Onset  . Hypertension Other     Social History Social History   Tobacco Use  . Smoking status: Current Some Day Smoker    Types: Cigars  . Smokeless tobacco: Never Used  Substance Use Topics  . Alcohol use: No  . Drug use: Yes    Types: Marijuana     Allergies   Amoxicillin   Review of Systems Review of Systems  Constitutional: Positive for chills. Negative for fever.  HENT: Positive for congestion, ear pain, rhinorrhea, sinus pressure, sinus pain and sore throat.   Respiratory: Positive for cough and chest tightness. Negative for shortness of breath.   Cardiovascular: Negative.  Negative for chest pain.  Gastrointestinal: Negative.  Negative for nausea and vomiting.  Musculoskeletal: Negative.  Negative for neck stiffness.  Skin: Negative.  Negative for rash.  Neurological: Negative.  Negative for headaches.     Physical Exam Updated Vital Signs BP 111/77 (BP Location: Right Arm)   Pulse 64   Temp 98 F (36.7 C) (Oral)   Resp 16   LMP 04/10/2018   SpO2 100%   Physical Exam  Constitutional: She is oriented to person, place, and  time. She appears well-developed and well-nourished.  HENT:  Head: Normocephalic.  Nose: Mucosal edema present.  Mouth/Throat: Mucous membranes are normal. Posterior oropharyngeal erythema present. No posterior oropharyngeal edema.  Neck: Normal range of motion. Neck supple.  Cardiovascular: Normal rate and regular rhythm.  Pulmonary/Chest: Effort normal and breath sounds normal. She has no wheezes. She has no rales.  Abdominal: Soft. Bowel sounds are normal. There is no tenderness. There is no rebound and no guarding.  Musculoskeletal: Normal range of motion.  Lymphadenopathy:    She has no cervical adenopathy.  Neurological: She is alert  and oriented to person, place, and time.  Skin: Skin is warm and dry. No rash noted.  Psychiatric: She has a normal mood and affect.     ED Treatments / Results  Labs (all labs ordered are listed, but only abnormal results are displayed) Labs Reviewed - No data to display  EKG None  Radiology Dg Chest 2 View  Result Date: 04/17/2018 CLINICAL DATA:  Cough, congestion, and chills since last Saturday. EXAM: CHEST - 2 VIEW COMPARISON:  None. FINDINGS: The heart size and mediastinal contours are within normal limits. Both lungs are clear. The visualized skeletal structures are unremarkable. IMPRESSION: No active cardiopulmonary disease. Electronically Signed   By: Burman Nieves M.D.   On: 04/17/2018 02:02    Procedures Procedures (including critical care time)  Medications Ordered in ED Medications - No data to display   Initial Impression / Assessment and Plan / ED Course  I have reviewed the triage vital signs and the nursing notes.  Pertinent labs & imaging results that were available during my care of the patient were reviewed by me and considered in my medical decision making (see chart for details).     Patient here with URI symptoms x 1 week, chills. No better with supportive OTC care.   Symptoms likely viral, however, will treat given duration of symptoms. Z-Pack prescribed. Recommended continuation of Sudafed, use of tylenol and/or ibuprofen, fluids, rest.   Final Clinical Impressions(s) / ED Diagnoses   Final diagnoses:  None   1. Upper respiratory infection  ED Discharge Orders    None       Elpidio Anis, PA-C 04/17/18 0445    Dione Booze, MD 04/17/18 [phone]

## 2018-04-17 NOTE — ED Triage Notes (Signed)
C/o productive cough with yellow and brown phlegm, chest congestion, R ear pain, runny nose, and sore throat x 1 week.

## 2018-04-17 NOTE — ED Notes (Signed)
Patient left at this time with all belongings. 

## 2018-05-23 ENCOUNTER — Emergency Department (HOSPITAL_COMMUNITY)
Admission: EM | Admit: 2018-05-23 | Discharge: 2018-05-23 | Disposition: A | Discharge status: Home / Self Care | Payer: Self-pay | Attending: Emergency Medicine | Admitting: Emergency Medicine

## 2018-05-23 ENCOUNTER — Emergency Department (HOSPITAL_COMMUNITY): Payer: Self-pay

## 2018-05-23 ENCOUNTER — Encounter (HOSPITAL_COMMUNITY): Payer: Self-pay | Admitting: Emergency Medicine

## 2018-05-23 DIAGNOSIS — J069 Acute upper respiratory infection, unspecified: Secondary | ICD-10-CM

## 2018-05-23 DIAGNOSIS — N760 Acute vaginitis: Secondary | ICD-10-CM

## 2018-05-23 DIAGNOSIS — R05 Cough: Secondary | ICD-10-CM

## 2018-05-23 DIAGNOSIS — B9789 Other viral agents as the cause of diseases classified elsewhere: Secondary | ICD-10-CM

## 2018-05-23 DIAGNOSIS — F17210 Nicotine dependence, cigarettes, uncomplicated: Secondary | ICD-10-CM | POA: Insufficient documentation

## 2018-05-23 DIAGNOSIS — B9689 Other specified bacterial agents as the cause of diseases classified elsewhere: Secondary | ICD-10-CM

## 2018-05-23 DIAGNOSIS — R059 Cough, unspecified: Secondary | ICD-10-CM

## 2018-05-23 LAB — URINALYSIS, ROUTINE W REFLEX MICROSCOPIC
Bacteria, UA: NONE SEEN
Bilirubin Urine: NEGATIVE
GLUCOSE, UA: NEGATIVE mg/dL
HGB URINE DIPSTICK: NEGATIVE
Ketones, ur: NEGATIVE mg/dL
NITRITE: NEGATIVE
PH: 5 (ref 5.0–8.0)
Protein, ur: NEGATIVE mg/dL
SPECIFIC GRAVITY, URINE: 1.024 (ref 1.005–1.030)

## 2018-05-23 LAB — POC URINE PREG, ED: Preg Test, Ur: NEGATIVE

## 2018-05-23 LAB — WET PREP, GENITAL
SPERM: NONE SEEN
Trich, Wet Prep: NONE SEEN
Yeast Wet Prep HPF POC: NONE SEEN

## 2018-05-23 MED ORDER — ALBUTEROL SULFATE HFA 108 (90 BASE) MCG/ACT IN AERS
2.0000 | INHALATION_SPRAY | RESPIRATORY_TRACT | Status: DC | PRN
  Administered 2018-05-23: 2 via RESPIRATORY_TRACT
  Filled 2018-05-23: qty 6.7

## 2018-05-23 MED ORDER — BENZONATATE 100 MG PO CAPS: 100.0000 mg | ORAL_CAPSULE | Freq: Three times a day (TID) | ORAL | 0 refills | Status: DC

## 2018-05-23 MED ORDER — METRONIDAZOLE 500 MG PO TABS: 500.0000 mg | ORAL_TABLET | Freq: Two times a day (BID) | ORAL | 0 refills | Status: DC

## 2018-05-23 MED ORDER — IPRATROPIUM-ALBUTEROL 0.5-2.5 (3) MG/3ML IN SOLN
3.0000 mL | Freq: Once | RESPIRATORY_TRACT | Status: AC
Start: 2018-05-23 — End: 2018-05-23
  Administered 2018-05-23: 3 mL via RESPIRATORY_TRACT
  Filled 2018-05-23: qty 3

## 2018-05-23 MED ORDER — PREDNISONE 10 MG PO TABS: 40.0000 mg | ORAL_TABLET | Freq: Every day | ORAL | 0 refills | Status: DC

## 2018-05-23 NOTE — ED Triage Notes (Signed)
Pt c/o cough onset last night with yellow mucous today and fatigue, pt c/o L posterior rib pain and post nasal drip onset today.  Pt c/o non traumatic pain to the bottom of her L foot onset in the parking lot after arrival at the ED tonight.  Pt also c/o malodorous heavy yellow vaginal discharge x 1 week.  Pt states she had to leave work because of s/s.

## 2018-05-23 NOTE — Discharge Instructions (Signed)
Use the inhaler 2 puffs every 4 hours as needed. STOP VAPING.

## 2018-05-23 NOTE — ED Provider Notes (Addendum)
[facility] EMERGENCY DEPARTMENT Provider Note   CSN: [id number] Arrival date & time: 05/23/18  1904     History   Chief Complaint Chief Complaint  Patient presents with  . Cough    HPI Kelly Crosby is a 27 y.o. female who presents to the ED with a productive cough that started a few days ago with yellow mucous. Patient reports feeling tired and had to leave work today. Patient reports she has been vaping and last time was 4 days ago. Patient also c/o malodorous heavy yellow vaginal discharge x 1 week. Patient reports having unprotected sex.   HPI  Past Medical History:  Diagnosis Date  . A-fib (HCC)   . Anxiety   . Depression   . Schizotaxia     Patient Active Problem List   Diagnosis Date Noted  . Schizoaffective disorder (HCC) 09/26/2016  . Cannabis use disorder, severe, dependence (HCC) 09/26/2016    Past Surgical History:  Procedure Laterality Date  . NO PAST SURGERIES       OB History   None      Home Medications    Prior to Admission medications   Medication Sig Start Date End Date Taking? Authorizing Provider  ARIPiprazole (ABILIFY) 5 MG tablet Take 1 tablet (5 mg total) by mouth 2 (two) times daily. Patient not taking: Reported on 12/02/2016 10/06/16   Charm Rings, NP  benzonatate (TESSALON) 100 MG capsule Take 1 capsule (100 mg total) by mouth every 8 (eight) hours. 05/23/18   Janne Napoleon, NP  carbamazepine (TEGRETOL XR) 200 MG 12 hr tablet Take 1 tablet (200 mg total) by mouth 2 (two) times daily. Patient not taking: Reported on 12/02/2016 10/06/16   Charm Rings, NP  metroNIDAZOLE (FLAGYL) 500 MG tablet Take 1 tablet (500 mg total) by mouth 2 (two) times daily. 05/23/18   Janne Napoleon, NP  predniSONE (DELTASONE) 10 MG tablet Take 4 tablets (40 mg total) by mouth daily with breakfast. 05/23/18   Janne Napoleon, NP    Family History Family History  Problem Relation Age of Onset  . Hypertension Other     Social  History Social History   Tobacco Use  . Smoking status: Current Some Day Smoker    Types: Cigars  . Smokeless tobacco: Never Used  Substance Use Topics  . Alcohol use: Yes    Comment: social  . Drug use: Yes    Types: Marijuana     Allergies   Amoxicillin   Review of Systems Review of Systems  Constitutional: Positive for chills. Negative for fever.  HENT: Positive for congestion, postnasal drip and sinus pressure.   Eyes: Negative for discharge and redness.  Respiratory: Positive for cough.   Cardiovascular: Chest pain: only with cough.  Gastrointestinal: Negative for abdominal pain, nausea and vomiting.  Genitourinary: Positive for vaginal discharge. Negative for dysuria, frequency and urgency.  Musculoskeletal: Negative for back pain and neck pain.  Skin: Negative for rash.  Neurological: Negative for syncope and headaches.  Hematological: Negative for adenopathy.  Psychiatric/Behavioral: Negative for confusion.     Physical Exam Updated Vital Signs BP (!) 140/94   Pulse 94   Temp 98.5 F (36.9 C) (Oral)   Resp 16   Ht 5\' 6"  (1.676 m)   LMP 05/06/2018   SpO2 99%   BMI 40.03 kg/m   Physical Exam  Constitutional: She appears well-developed and well-nourished. No distress.  HENT:  Head: Normocephalic and atraumatic.  Right Ear:  Tympanic membrane normal.  Left Ear: Tympanic membrane normal.  Nose: Nose normal.  Mouth/Throat: Uvula is midline, oropharynx is clear and moist and mucous membranes are normal.  Eyes: EOM are normal.  Neck: Normal range of motion. Neck supple.  Cardiovascular: Normal rate and regular rhythm.  Pulmonary/Chest: Effort normal. She has decreased breath sounds.  Abdominal: Soft. There is no tenderness.  Genitourinary:  Genitourinary Comments: External genitalia without lesions, frothy, yellow, malodorous d/c vaginal vault. No CMT, no adnexal tenderness or mass palpated. Uterus without palpable enlargement.   Musculoskeletal: Normal  range of motion.  Lymphadenopathy:    She has no cervical adenopathy.  Neurological: She is alert.  Skin: Skin is warm and dry.  Psychiatric: She has a normal mood and affect.  Nursing note and vitals reviewed.    ED Treatments / Results  Labs (all labs ordered are listed, but only abnormal results are displayed) Labs Reviewed  WET PREP, GENITAL - Abnormal; Notable for the following components:      Result Value   Clue Cells Wet Prep HPF POC PRESENT (*)    WBC, Wet Prep HPF POC MANY (*)    All other components within normal limits  URINALYSIS, ROUTINE W REFLEX MICROSCOPIC - Abnormal; Notable for the following components:   Leukocytes, UA MODERATE (*)    All other components within normal limits  POC URINE PREG, ED  GC/CHLAMYDIA PROBE AMP ([place]) NOT AT [facility]  Radiology Dg Chest 2 View  Result Date: 05/23/2018 CLINICAL DATA:  Patient with cough. EXAM: CHEST - 2 VIEW COMPARISON:  Chest radiograph 04/17/2018 FINDINGS: Normal cardiac and mediastinal contours. No consolidative pulmonary opacities. No pleural effusion or pneumothorax. Unremarkable osseous structures IMPRESSION: No acute cardiopulmonary process. Electronically Signed   By: Annia Belt M.D.   On: 05/23/2018 21:12    Procedures Procedures (including critical care time)  Medications Ordered in ED Medications  albuterol (PROVENTIL HFA;VENTOLIN HFA) 108 (90 Base) MCG/ACT inhaler 2 puff (has no administration in time range)  ipratropium-albuterol (DUONEB) 0.5-2.5 (3) MG/3ML nebulizer solution 3 mL (3 mLs Nebulization Given 05/23/18 2043)     Initial Impression / Assessment and Plan / ED Course  I have reviewed the triage vital signs and the nursing notes. 27 y.o. female here with cough and congestion after vaping stable for d/c with normal chest x-ray and O2 SAT 99% on R/A. Encouraged patient to stop Vaping.  Symptoms improved after duoneb. Will d/c home with cough medication, albuterol inhaler and discussed cool  mist vaporizer. Patient also with vaginal d/c and odor. Will treat with Flagyl for BV. Encouraged patient to f/u with PCP. Return precautions discussed.   Final Clinical Impressions(s) / ED Diagnoses   Final diagnoses:  Cough in adult  Bacterial vaginosis  Viral URI with cough    ED Discharge Orders         Ordered    benzonatate (TESSALON) 100 MG capsule  Every 8 hours     05/23/18 2122    metroNIDAZOLE (FLAGYL) 500 MG tablet  2 times daily     05/23/18 2122    predniSONE (DELTASONE) 10 MG tablet  Daily with breakfast     05/23/18 2123           Kerrie Buffalo [place], NP 05/23/18 2043    Kerrie Buffalo [place], NP 05/23/18 2127    Azalia Bilis, MD 05/23/18 2154

## 2018-05-24 LAB — GC/CHLAMYDIA PROBE AMP (~~LOC~~) NOT AT ARMC
CHLAMYDIA, DNA PROBE: NEGATIVE
Neisseria Gonorrhea: POSITIVE — AB

## 2018-05-27 ENCOUNTER — Emergency Department (HOSPITAL_COMMUNITY)
Admission: EM | Admit: 2018-05-27 | Discharge: 2018-05-27 | Disposition: A | Discharge status: Home / Self Care | Payer: Medicaid Other | Attending: Emergency Medicine | Admitting: Emergency Medicine

## 2018-05-27 ENCOUNTER — Other Ambulatory Visit: Payer: Self-pay

## 2018-05-27 ENCOUNTER — Encounter (HOSPITAL_COMMUNITY): Payer: Self-pay

## 2018-05-27 DIAGNOSIS — F17290 Nicotine dependence, other tobacco product, uncomplicated: Secondary | ICD-10-CM | POA: Insufficient documentation

## 2018-05-27 DIAGNOSIS — J209 Acute bronchitis, unspecified: Secondary | ICD-10-CM

## 2018-05-27 DIAGNOSIS — A549 Gonococcal infection, unspecified: Secondary | ICD-10-CM | POA: Insufficient documentation

## 2018-05-27 MED ORDER — AZITHROMYCIN 250 MG PO TABS
1000.0000 mg | ORAL_TABLET | Freq: Once | ORAL | Status: AC
  Administered 2018-05-27: 1000 mg via ORAL
  Filled 2018-05-27: qty 4

## 2018-05-27 MED ORDER — LIDOCAINE HCL 1 % IJ SOLN
INTRAMUSCULAR | Status: AC
  Administered 2018-05-27: 0.9 mL
  Filled 2018-05-27: qty 20

## 2018-05-27 MED ORDER — BENZONATATE 100 MG PO CAPS
100.0000 mg | ORAL_CAPSULE | Freq: Once | ORAL | Status: AC
  Administered 2018-05-27: 100 mg via ORAL
  Filled 2018-05-27: qty 1

## 2018-05-27 MED ORDER — METRONIDAZOLE 500 MG PO TABS
500.0000 mg | ORAL_TABLET | Freq: Once | ORAL | Status: AC
  Administered 2018-05-27: 500 mg via ORAL
  Filled 2018-05-27: qty 1

## 2018-05-27 MED ORDER — ONDANSETRON 8 MG PO TBDP
8.0000 mg | ORAL_TABLET | Freq: Once | ORAL | Status: AC
  Administered 2018-05-27: 8 mg via ORAL
  Filled 2018-05-27: qty 1

## 2018-05-27 MED ORDER — CEFTRIAXONE SODIUM 250 MG IJ SOLR
250.0000 mg | Freq: Once | INTRAMUSCULAR | Status: AC
  Administered 2018-05-27: 250 mg via INTRAMUSCULAR
  Filled 2018-05-27: qty 250

## 2018-05-27 MED ORDER — IPRATROPIUM-ALBUTEROL 0.5-2.5 (3) MG/3ML IN SOLN
3.0000 mL | RESPIRATORY_TRACT | Status: DC
  Administered 2018-05-27: 3 mL via RESPIRATORY_TRACT
  Filled 2018-05-27: qty 3

## 2018-05-27 NOTE — ED Provider Notes (Signed)
[facility]-EMERGENCY DEPT Provider Note: Lowella Dell, MD, FACEP  CSN: [id number] MRN: [id number] ARRIVAL: 05/27/18 at 0018 ROOM: WA21/WA21   CHIEF COMPLAINT  Cough   HISTORY OF PRESENT ILLNESS  05/27/18 12:53 AM Kelly Crosby is a 27 y.o. female who was seen in the ED on the 13th and treated for bronchitis with an albuterol inhaler, steroids and benzonatate.  Her symptoms have improved but she is here requesting a breathing treatment.  She was also tested for gonorrhea and chlamydia on that visit and was positive for gonorrhea.  She is requesting definitive treatment.  She denies abdominal pain.   Past Medical History:  Diagnosis Date  . A-fib (HCC)   . Anxiety   . Depression   . Schizotaxia     Past Surgical History:  Procedure Laterality Date  . NO PAST SURGERIES      Family History  Problem Relation Age of Onset  . Hypertension Other     Social History   Tobacco Use  . Smoking status: Current Some Day Smoker    Types: Cigars  . Smokeless tobacco: Never Used  Substance Use Topics  . Alcohol use: Yes    Comment: social  . Drug use: Yes    Types: Marijuana    Prior to Admission medications   Medication Sig Start Date End Date Taking? Authorizing Provider  ARIPiprazole (ABILIFY) 5 MG tablet Take 1 tablet (5 mg total) by mouth 2 (two) times daily. Patient not taking: Reported on 12/02/2016 10/06/16   Charm Rings, NP  benzonatate (TESSALON) 100 MG capsule Take 1 capsule (100 mg total) by mouth every 8 (eight) hours. 05/23/18   Janne Napoleon, NP  carbamazepine (TEGRETOL XR) 200 MG 12 hr tablet Take 1 tablet (200 mg total) by mouth 2 (two) times daily. Patient not taking: Reported on 12/02/2016 10/06/16   Charm Rings, NP  metroNIDAZOLE (FLAGYL) 500 MG tablet Take 1 tablet (500 mg total) by mouth 2 (two) times daily. 05/23/18   Janne Napoleon, NP  predniSONE (DELTASONE) 10 MG tablet Take 4 tablets (40 mg total) by mouth daily with breakfast. 05/23/18   Janne Napoleon,  NP    Allergies Amoxicillin   REVIEW OF SYSTEMS  Negative except as noted here or in the History of Present Illness.   PHYSICAL EXAMINATION  Initial Vital Signs Blood pressure 130/89, pulse (!) 104, temperature 99.6 F (37.6 C), temperature source Oral, resp. rate 18, last menstrual period 05/06/2018, SpO2 97 %.  Examination General: Well-developed, well-nourished female in no acute distress; appearance consistent with age of record HENT: normocephalic; atraumatic Eyes: pupils equal, round and reactive to light; extraocular muscles intact Neck: supple Heart: regular rate and rhythm Lungs: Decreased air movement without frank wheezing Abdomen: soft; nondistended; nontender; bowel sounds present Extremities: No deformity; full range of motion; pulses normal Neurologic: Awake, alert and oriented; motor function intact in all extremities and symmetric; no facial droop Skin: Warm and dry Psychiatric: Normal mood and affect   RESULTS  Summary of this visit's results, reviewed by myself:   EKG Interpretation  Date/Time:    Ventricular Rate:    PR Interval:    QRS Duration:   QT Interval:    QTC Calculation:   R Axis:     Text Interpretation:        Laboratory Studies: No results found for this or any previous visit (from the past 24 hour(s)). Imaging Studies: No results found.  ED COURSE and MDM  Nursing  notes and initial vitals signs, including pulse oximetry, reviewed.  Vitals:   05/27/18 0025  BP: 130/89  Pulse: (!) 104  Resp: 18  Temp: 99.6 F (37.6 C)  TempSrc: Oral  SpO2: 97%   We will administer a breathing treatment and treat the patient for gonorrhea.  PROCEDURES    ED DIAGNOSES     ICD-10-CM   1. Acute bronchitis with bronchospasm J20.9   2. Gonorrhea in female A54.9        Collette Pescador, MD 05/27/18 [phone]

## 2018-05-27 NOTE — ED Triage Notes (Signed)
Pt reports wet cough, congestion and runny nose since Sunday. Coughing up brown and yellow mucous. Seen Sunday at [facility] for same.  Pt also reports that her gonorrhea results came back positive and she needs antibiotics.

## 2018-06-13 ENCOUNTER — Encounter (HOSPITAL_COMMUNITY): Payer: Self-pay | Admitting: Emergency Medicine

## 2018-06-13 ENCOUNTER — Emergency Department (HOSPITAL_COMMUNITY)
Admission: EM | Admit: 2018-06-13 | Discharge: 2018-06-14 | Disposition: A | Discharge status: Home / Self Care | Payer: Medicaid Other | Attending: Emergency Medicine | Admitting: Emergency Medicine

## 2018-06-13 ENCOUNTER — Other Ambulatory Visit: Payer: Self-pay

## 2018-06-13 DIAGNOSIS — F17210 Nicotine dependence, cigarettes, uncomplicated: Secondary | ICD-10-CM | POA: Insufficient documentation

## 2018-06-13 DIAGNOSIS — F1220 Cannabis dependence, uncomplicated: Secondary | ICD-10-CM | POA: Diagnosis present

## 2018-06-13 DIAGNOSIS — F22 Delusional disorders: Secondary | ICD-10-CM

## 2018-06-13 DIAGNOSIS — Z79899 Other long term (current) drug therapy: Secondary | ICD-10-CM | POA: Insufficient documentation

## 2018-06-13 DIAGNOSIS — F329 Major depressive disorder, single episode, unspecified: Secondary | ICD-10-CM | POA: Insufficient documentation

## 2018-06-13 DIAGNOSIS — G4700 Insomnia, unspecified: Secondary | ICD-10-CM | POA: Insufficient documentation

## 2018-06-13 LAB — COMPREHENSIVE METABOLIC PANEL
ALBUMIN: 4.2 g/dL (ref 3.5–5.0)
ALK PHOS: 52 U/L (ref 38–126)
ALT: 19 U/L (ref 0–44)
ANION GAP: 7 (ref 5–15)
AST: 19 U/L (ref 15–41)
BILIRUBIN TOTAL: 0.8 mg/dL (ref 0.3–1.2)
BUN: 8 mg/dL (ref 6–20)
CALCIUM: 9.3 mg/dL (ref 8.9–10.3)
CO2: 24 mmol/L (ref 22–32)
CREATININE: 0.81 mg/dL (ref 0.44–1.00)
Chloride: 107 mmol/L (ref 98–111)
GFR calc non Af Amer: >60 mL/min (ref >60)
GLUCOSE: 94 mg/dL (ref 70–99)
Potassium: 3.7 mmol/L (ref 3.5–5.1)
Sodium: 138 mmol/L (ref 135–145)
TOTAL PROTEIN: 7.5 g/dL (ref 6.5–8.1)

## 2018-06-13 LAB — RAPID URINE DRUG SCREEN, HOSP PERFORMED
Amphetamines: NOT DETECTED
BARBITURATES: NOT DETECTED
Benzodiazepines: NOT DETECTED
Cocaine: NOT DETECTED
Opiates: NOT DETECTED
Tetrahydrocannabinol: POSITIVE — AB

## 2018-06-13 LAB — CBC
HEMATOCRIT: 39.7 % (ref 36.0–46.0)
Hemoglobin: 13.7 g/dL (ref 12.0–15.0)
MCH: 28.7 pg (ref 26.0–34.0)
MCHC: 34.5 g/dL (ref 30.0–36.0)
MCV: 83.1 fL (ref 80.0–100.0)
NRBC: 0 % (ref 0.0–0.2)
PLATELETS: 310 10*3/uL (ref 150–400)
RBC: 4.78 MIL/uL (ref 3.87–5.11)
RDW: 13.5 % (ref 11.5–15.5)
WBC: 9.2 10*3/uL (ref 4.0–10.5)

## 2018-06-13 LAB — I-STAT BETA HCG BLOOD, ED (MC, WL, AP ONLY)

## 2018-06-13 LAB — ETHANOL: Alcohol, Ethyl (B): <10 mg/dL (ref <10)

## 2018-06-13 NOTE — ED Triage Notes (Addendum)
Pt states she has been chain smoking blunts since Saturday morning and believes her "so called friends" gave her blunts laced with something else.  States she wants to be transferred to [facility] for her "mental issues" but denies SI.  States she has not slept since Saturday morning.  Pt also believes she is pregnant.  No + pregnancy test.  States she is scheduled for an [place] on 11/18 for her possible pregnancy because she gets "false negative pregnancy test".  States she is experiencing paranoia because of the people she hangs out with.

## 2018-06-13 NOTE — ED Notes (Signed)
Patient states that she wants to sit in lobby.

## 2018-06-14 MED ORDER — ALBUTEROL SULFATE HFA 108 (90 BASE) MCG/ACT IN AERS
2.0000 | INHALATION_SPRAY | Freq: Once | RESPIRATORY_TRACT | Status: DC
  Filled 2018-06-14: qty 6.7

## 2018-06-14 NOTE — ED Notes (Signed)
Called to pt room to review 4 pea size bug bites at left thigh. Some redness noted at bite site but non around area. Pt request banaid due to "itching.

## 2018-06-14 NOTE — ED Notes (Signed)
Pt at nurses station and out of room multiple times, not easily redirected.

## 2018-06-14 NOTE — ED Notes (Addendum)
Pt at desk requesting neb treatment and inhaler " because room is hot". Pt has bilateral clear lungs to basses with resp even and unlabored. Will inform provide of request. Room temp decreased.

## 2018-06-14 NOTE — ED Notes (Signed)
Pt comes from her room in B19 into the mini lab where this tech is working. Pt is requesting a fan due to the room being stuffy. This tech informed her I would check and see if a fan could be ordered. Explained to pt it would be a small fan.

## 2018-06-14 NOTE — Consult Note (Signed)
Telepsych Consultation   Reason for Consult:  Paranoia Referring Physician: EDP Location of Patient: [facility] Location of Provider: [facility]  Patient Identification: Kelly Crosby MRN:  [id number] Principal Diagnosis: Cannabis use disorder, severe, dependence ([place]) Diagnosis:   Patient Active Problem List   Diagnosis Date Noted  . Schizoaffective disorder ([place]) [F25.9] 09/26/2016  . Cannabis use disorder, severe, dependence ([place]) [F12.20] 09/26/2016    Total Time spent with patient: 20 minutes  Subjective:   Kelly Crosby is a 27 y.o. female patient admitted with paranoia after using marijuana.   HPI:    Per initial tele assessment by Windell Hummingbird, Counselor 06/14/2018:  Kelly Crosby is a 27 y.o. female who presents to the [facility] due to difficulties resulting from chain-smoking blunts since Saturday morning. Pt informed triage that she would like to be transferred to [facility] due to "mental issues," not sleeping since Saturday morning, and a belief that she is pregnant despite a negative pregnancy test. Pt also tells triage that she is experiencing paranoia due to the people she is currently spending time with.  Pt is difficult towards clinician; she double-talks and answers the assessment questions with questions. She tells clinician that she knows she is being recorded, despite clinician informing her that she is not, and questions the validity of clinician's questions ("you're really asking me dumb questions"). Pt states she is currently at the hospital due to "life and homelessness and dealing with dirty people." When clinician asks what that means and how it rorresponds with her mental health, pt responds "I'm very sexually active." Clinician inquired as to whether that's what she meant when she stated she was dealing with "dirty people," and pt stated that, no, she meant that she was dealing with people with ticks and roaches. Clinician inquired as to  whether pt has been experiencing SI and pt stated that no, she had not been experiencing SI, but she had experienced HI. Clinician prompted pt to provide more information regarding each, and pt gave different answers in regards to her SI. It appears that pt has not felt SI for 4 years, she has never had SI attempts, and she has been hospitalized before ("you look it up, it's in there," though it wasn't), though clinician was unable to identify when and where.  Clinician inquired about the HI pt had previously mentioned, and pt became defensive and stated that she had never said she was experiencing HI. Clinician repeated back to HI what she had stated, and pt argued and told clinician to "rewind the tape." Clinician reminded pt that this assessment was not being recorded and that she was simply reading back to pt what she had said to her and, if it was different than what clinician understood, to please clarify. Pt stated she has experienced HI in the past but that she was not experiencing these thoughts/feelings at this time. Pt also denied NSSIB. Pt denied current AH, though she stated she had experienced them 2 years ago, though she believed that was drug-related.  Pt denies any current assistance through therapists or psychiatrists and cannot recollect when she has utilized them in the past, though she states she is currently utilizing case managers through the [employer]. Pt states she currently uses marijuana, though she was unable to provide definitive answers regarding this use, as she states the use "varies." Pt denied any court involvement, stating she has a clean record. When asked about pt having access to weapons, such as guns,  pt listed all of her belongings; after pt was done listing her belongings, clinician responded by asking if the answer to the question was "no." Pt stated that no, she didn't say that--she answered the question open-ended by listing the belongings she owned. Clinician  stated that it wasn't an open-ended question and that she needed to know whether pt had access to guns for safety reasons. Pt began to argue this question and clinician responded that she would simply document that pt declined to answer the question.   Pt reports she is single and has no support. She shares she is enrolled in school at [employer] in [place] and that she plans to drop out on June 21, 2018. 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 Pt states she has had no sleep, and when clinician inquired how long it has been since she's had sleep, she declined to answer the question. Pt stated that her appetite lately has been "light."  Clinician began asking pt about her family history in regards to family SI, MH, and SA, though pt began to get agitated and questioned what clinician was asking and clinician skipped these questions. Clinician inquired as to any VA that may have been inflicted onto pt ("27 years worth"), PA that may have been inflicted onto pt ("27 years worth"), and any SA that may have been inflicted onto pt ("16 years worth"). Pt declined to elaborate further.   Per am psychiatric assessment:  Patient alert and oriented. She states "I am doing fine now. I just messed up over the weekend. I feel great. Just need to get a shower and get to my appointment in [facility] later today." The patient denies any SI/HI. There is no evidence the patient is experiencing psychosis. Patient does not meet criteria for inpatient psychiatric admission. Kelly Crosby is requesting discharge from the hospital.  Past Psychiatric History: Schizophrenia  Risk to Self: Suicidal Ideation: No Suicidal Intent: No Is patient at risk for suicide?: No Suicidal Plan?: No Access to Means: No What has been your use of drugs/alcohol within the last 12 months?: Pt states she uses marijuana on a  varying basis How many times?: (UTA - pt changed her answer several times) Other Self Harm Risks: None noted Triggers for Past Attempts: None known, Unknown Intentional Self Injurious Behavior: None Risk to Others: Homicidal Ideation: No Thoughts of Harm to Others: No Current Homicidal Intent: No Current Homicidal Plan: No Access to Homicidal Means: No Identified Victim: None noted History of harm to others?: No Assessment of Violence: On admission Violent Behavior Description: None noted Does patient have access to weapons?: (Pt gave different answers, UTA) Criminal Charges Pending?: No Does patient have a court date: No Prior Inpatient Therapy: Prior Inpatient Therapy: Yes Prior Therapy Dates: Unknown - pt was unable to provide this information Prior Therapy Facilty/Provider(s): Unknown - pt was unable to provide this information Reason for Treatment: Unknown - pt was unable to provide this information Prior Outpatient Therapy: Prior Outpatient Therapy: No(Pt states she has been working with Case Managers this month) Does patient have an ACCT team?: No Does patient have Intensive In-House Services?  : No Does patient have [facility] services? : No Does patient have P4CC services?: No  Past Medical History:  Past Medical History:  Diagnosis Date  . A-fib ([place])   . Anxiety   . Depression   . Schizotaxia  Past Surgical History:  Procedure Laterality Date  . NO PAST SURGERIES     Family History:  Family History  Problem Relation Age of Onset  . Hypertension Other    Family Psychiatric  History: Unknown Social History:  Social History   Substance and Sexual Activity  Alcohol Use Yes     Social History   Substance and Sexual Activity  Drug Use Yes  . Types: Marijuana    Social History   Socioeconomic History  . Marital status: Single    Spouse name: Not on file  . Number of children: Not on file  . Years of education: Not on file  . Highest education level:  Not on file  Occupational History  . Not on file  Social Needs  . Financial resource strain: Not on file  . Food insecurity:    Worry: Not on file    Inability: Not on file  . Transportation needs:    Medical: Not on file    Non-medical: Not on file  Tobacco Use  . Smoking status: Current Some Day Smoker    Types: Cigars  . Smokeless tobacco: Never Used  Substance and Sexual Activity  . Alcohol use: Yes  . Drug use: Yes    Types: Marijuana  . Sexual activity: Yes    Birth control/protection: Injection  Lifestyle  . Physical activity:    Days per week: Not on file    Minutes per session: Not on file  . Stress: Not on file  Relationships  . Social connections:    Talks on phone: Not on file    Gets together: Not on file    Attends religious service: Not on file    Active member of club or organization: Not on file    Attends meetings of clubs or organizations: Not on file    Relationship status: Not on file  Other Topics Concern  . Not on file  Social History Narrative  . Not on file   Additional Social History:    Allergies:   Allergies  Allergen Reactions  . Amoxicillin Other (See Comments)    Childhood      Labs:  Results for orders placed or performed during the hospital encounter of 06/13/18 (from the past 48 hour(s))  Rapid urine drug screen (hospital performed)     Status: Abnormal   Collection Time: 06/13/18  9:50 PM  Result Value Ref Range   Opiates NONE DETECTED NONE DETECTED   Cocaine NONE DETECTED NONE DETECTED   Benzodiazepines NONE DETECTED NONE DETECTED   Amphetamines NONE DETECTED NONE DETECTED   Tetrahydrocannabinol POSITIVE (A) NONE DETECTED   Barbiturates NONE DETECTED NONE DETECTED    Comment: (NOTE) DRUG SCREEN FOR MEDICAL PURPOSES ONLY.  IF CONFIRMATION IS NEEDED FOR ANY PURPOSE, NOTIFY LAB WITHIN 5 DAYS. LOWEST DETECTABLE LIMITS FOR URINE DRUG SCREEN Drug Class                     Cutoff (ng/mL) Amphetamine and metabolites     1000 Barbiturate and metabolites    200 Benzodiazepine                 240 Tricyclics and metabolites     300 Opiates and metabolites        300 Cocaine and metabolites        300 THC  50 Performed at [facility], [place] [address] [place], [place] [address]   Comprehensive metabolic panel     Status: None   Collection Time: 06/13/18  9:59 PM  Result Value Ref Range   Sodium 138 135 - 145 mmol/L   Potassium 3.7 3.5 - 5.1 mmol/L   Chloride 107 98 - 111 mmol/L   CO2 24 22 - 32 mmol/L   Glucose, Bld 94 70 - 99 mg/dL   BUN 8 6 - 20 mg/dL   Creatinine, Ser 0.81 0.44 - 1.00 mg/dL   Calcium 9.3 8.9 - 10.3 mg/dL   Total Protein 7.5 6.5 - 8.1 g/dL   Albumin 4.2 3.5 - 5.0 g/dL   AST 19 15 - 41 U/L   ALT 19 0 - 44 U/L   Alkaline Phosphatase 52 38 - 126 U/L   Total Bilirubin 0.8 0.3 - 1.2 mg/dL   GFR calc non Af Amer >60 >60 mL/min   GFR calc Af Amer >60 >60 mL/min    Comment: (NOTE) The eGFR has been calculated using the CKD EPI equation. This calculation has not been validated in all clinical situations. eGFR's persistently <60 mL/min signify possible Chronic Kidney Disease.    Anion gap 7 5 - 15    Comment: Performed at [facility] [address], [place], [place] [address]  Ethanol     Status: None   Collection Time: 06/13/18  9:59 PM  Result Value Ref Range   Alcohol, Ethyl (B) <10 <10 mg/dL    Comment: (NOTE) Lowest detectable limit for serum alcohol is 10 mg/dL. For medical purposes only. Performed at [facility], [place] [address], [place], [place] [address]   cbc     Status: None   Collection Time: 06/13/18  9:59 PM  Result Value Ref Range   WBC 9.2 4.0 - 10.5 K/uL   RBC 4.78 3.87 - 5.11 MIL/uL   Hemoglobin 13.7 12.0 - 15.0 g/dL   HCT 39.7 36.0 - 46.0 %   MCV 83.1 80.0 - 100.0 fL   MCH 28.7 26.0 - 34.0 pg   MCHC 34.5 30.0 - 36.0 g/dL   RDW 13.5 11.5 - 15.5 %   Platelets 310 150 - 400 K/uL   nRBC 0.0 0.0 - 0.2 %     Comment: Performed at [facility], [place] [address], [place],  [address]  I-Stat beta hCG blood, ED     Status: None   Collection Time: 06/13/18 10:06 PM  Result Value Ref Range   I-stat hCG, quantitative <5.0 <5 mIU/mL   Comment 3            Comment:   GEST. AGE      CONC.  (mIU/mL)   <=1 WEEK        5 - 50     2 WEEKS       50 - 500     3 WEEKS       100 - 10,000     4 WEEKS     1,000 - 30,000        FEMALE AND NON-PREGNANT FEMALE:     LESS THAN 5 mIU/mL     Medications:  Current Facility-Administered Medications  Medication Dose Route Frequency Provider Last Rate Last Dose  . albuterol (PROVENTIL HFA;VENTOLIN HFA) 108 (90 Base) MCG/ACT inhaler 2 puff  2 puff Inhalation Once Deno Etienne, DO       Current Outpatient Medications  Medication Sig Dispense Refill  . benzonatate (TESSALON) 100 MG capsule Take 1 capsule (100 mg total) by mouth every 8 (eight) hours. (Patient not taking: Reported on 06/14/2018) 21 capsule 0  . metroNIDAZOLE (FLAGYL) 500 MG tablet Take 1 tablet (500 mg total) by mouth 2 (two) times daily. (Patient not taking: Reported on 06/14/2018) 14 tablet 0  . predniSONE (DELTASONE) 10 MG tablet Take 4 tablets (40 mg total) by mouth daily with breakfast. (Patient not taking: Reported on 06/14/2018) 16 tablet 0    Musculoskeletal:  Unable to assess via camera   Psychiatric Specialty Exam: Physical Exam  ROS  Blood pressure 115/76, pulse 84, temperature 98.3 F (36.8 C), temperature source Oral, resp. rate 16, height '5\' 6"'  (1.676 m), weight 112.5 kg, last menstrual period 06/02/2018, SpO2 98 %.Body mass index is 40.03 kg/m.  General Appearance: Casual  Eye Contact:  Good  Speech:  Clear and Coherent  Volume:  Normal  Mood:  Euthymic  Affect:  Appropriate  Thought Process:  Coherent and Goal Directed  Orientation:  Full (Time, Place, and Person)  Thought Content:  WDL  Suicidal Thoughts:  No  Homicidal Thoughts:  No  Memory:  Immediate;    Good Recent;   Good Remote;   Good  Judgement:  Fair  Insight:  Present  Psychomotor Activity:  Normal  Concentration:  Concentration: Good and Attention Span: Good  Recall:  Good  Fund of Knowledge:  Good  Language:  Good  Akathisia:  No  Handed:  Right  AIMS (if indicated):     Assets:  Communication Skills Desire for Improvement Financial Resources/Insurance Housing Intimacy Leisure Time Physical Health Resilience Social Support Talents/Skills  ADL's:  Intact  Cognition:  WNL  Sleep:        Treatment Plan Summary: Plan Discharge to home as is psychiatrically cleared.   Disposition: No evidence of imminent risk to self or others at present.   Patient does not meet criteria for psychiatric inpatient admission. Supportive therapy provided about ongoing stressors. Discussed crisis plan, support from social network, calling 911, coming to the Emergency Department, and calling Suicide Hotline.  This service was provided via telemedicine using a 2-way, interactive audio and video technology.  Names of all persons participating in this telemedicine service and their role in this encounter. Name: Elmarie Shiley  Role: PMHNP-C  Name: Caryl Comes [place] Role: Patient  Name:  Role:   Name:  Role:     Elmarie Shiley, NP 06/14/2018 10:35 AM

## 2018-06-14 NOTE — ED Notes (Signed)
Pt stable, ambulatory, states understanding of discharge isntructions 

## 2018-06-14 NOTE — ED Notes (Signed)
Pt requesting "update" and states she is visually impaired after using pen to write on paper.

## 2018-06-14 NOTE — ED Notes (Signed)
Pt requesting to call "driver"  Pt made 1 phone call

## 2018-06-14 NOTE — ED Notes (Signed)
Asked Pt to change into [employer] [employer]. [employer] would be in within 5 minutes to wand her. Pt stated "I needed more time b/c my eyes were not opening yet."

## 2018-06-14 NOTE — ED Notes (Signed)
Multiple patient request noted to Dr. Adela Lank.
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
## 2018-06-14 NOTE — ED Provider Notes (Signed)
   27 year old female with a history of schizoaffective disorder, anxiety, cannabis use disorder was brought in overnight for observation.  She was evaluated by behavioral health who recommends outpatient follow-up.  I spoke with patient at bedside, she denies any complaints, notes that she is stable and would like to be discharged.  No medical complaints or need for ongoing medical management.  Discharged with outpatient follow-up and return precautions.  Verbalized understanding and agreement to today's plan.  Today's Vitals   06/14/18 0729 06/14/18 0738 06/14/18 0855 06/14/18 1203  BP:  115/76  (!) 141/94  Pulse:  84  94  Resp:  16  20  Temp:  98.3 F (36.8 C)  98.6 F (37 C)  TempSrc:  Oral  Oral  SpO2:  98%  98%  Weight:      Height:      PainSc: 0-No pain  0-No pain    Body mass index is 40.03 kg/m.    Eyvonne Mechanic, PA-C 06/14/18 1356    Tilden Fossa, MD 06/21/18 [phone]

## 2018-06-14 NOTE — ED Notes (Signed)
Pt came out into the hall and is complaining of the room being stuffy. Pt is requesting a pillow and a warm blanket. Pt is given the same.

## 2018-06-14 NOTE — ED Notes (Signed)
TTS in progress 

## 2018-06-14 NOTE — ED Notes (Signed)
Regular Diet was ordered for Lunch. 
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
## 2018-06-14 NOTE — ED Notes (Signed)
Pt oob to bathroom where she coughs up phlegm. Comes out of bathrooma nd states she has vomitted. Offered medication for nausea but declines.

## 2018-06-14 NOTE — Discharge Instructions (Addendum)
Please read attached information. If you experience any new or worsening signs or symptoms please return to the emergency room for evaluation. Please follow-up with your primary care provider or specialist as discussed.  °

## 2018-06-14 NOTE — ED Notes (Signed)
Pt pressing buttons on the wall, pulled code lever

## 2018-06-15 ENCOUNTER — Emergency Department (HOSPITAL_COMMUNITY)
Admission: EM | Admit: 2018-06-15 | Discharge: 2018-06-15 | Disposition: A | Discharge status: Home / Self Care | Payer: Self-pay | Attending: Emergency Medicine | Admitting: Emergency Medicine

## 2018-06-15 ENCOUNTER — Encounter (HOSPITAL_COMMUNITY): Payer: Self-pay | Admitting: Emergency Medicine

## 2018-06-15 ENCOUNTER — Other Ambulatory Visit: Payer: Self-pay

## 2018-06-15 ENCOUNTER — Emergency Department (HOSPITAL_COMMUNITY): Payer: Self-pay

## 2018-06-15 DIAGNOSIS — W2209XA Striking against other stationary object, initial encounter: Secondary | ICD-10-CM | POA: Insufficient documentation

## 2018-06-15 DIAGNOSIS — Y929 Unspecified place or not applicable: Secondary | ICD-10-CM | POA: Insufficient documentation

## 2018-06-15 DIAGNOSIS — F17210 Nicotine dependence, cigarettes, uncomplicated: Secondary | ICD-10-CM | POA: Insufficient documentation

## 2018-06-15 DIAGNOSIS — Y999 Unspecified external cause status: Secondary | ICD-10-CM | POA: Insufficient documentation

## 2018-06-15 DIAGNOSIS — Z5321 Procedure and treatment not carried out due to patient leaving prior to being seen by health care provider: Secondary | ICD-10-CM | POA: Insufficient documentation

## 2018-06-15 DIAGNOSIS — R519 Headache, unspecified: Secondary | ICD-10-CM

## 2018-06-15 DIAGNOSIS — Y939 Activity, unspecified: Secondary | ICD-10-CM | POA: Insufficient documentation

## 2018-06-15 DIAGNOSIS — Z79899 Other long term (current) drug therapy: Secondary | ICD-10-CM | POA: Insufficient documentation

## 2018-06-15 DIAGNOSIS — R51 Headache: Secondary | ICD-10-CM | POA: Insufficient documentation

## 2018-06-15 DIAGNOSIS — S63501A Unspecified sprain of right wrist, initial encounter: Secondary | ICD-10-CM | POA: Insufficient documentation

## 2018-06-15 DIAGNOSIS — Y9289 Other specified places as the place of occurrence of the external cause: Secondary | ICD-10-CM | POA: Insufficient documentation

## 2018-06-15 MED ORDER — IBUPROFEN 600 MG PO TABS: 600.0000 mg | ORAL_TABLET | Freq: Four times a day (QID) | ORAL | 0 refills | Status: DC | PRN

## 2018-06-15 MED ORDER — IBUPROFEN 800 MG PO TABS
800.0000 mg | ORAL_TABLET | Freq: Once | ORAL | Status: AC
  Administered 2018-06-15: 800 mg via ORAL
  Filled 2018-06-15: qty 1
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
## 2018-06-15 NOTE — Discharge Instructions (Signed)
Your x-ray in the emergency department was negative for fracture/broken bone.  We advised the use of Tylenol or ibuprofen for management of pain.  Apply ice to areas of pain and injury to limit swelling.  Follow-up with a primary care doctor to ensure resolution of symptoms.
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
## 2018-06-15 NOTE — ED Notes (Signed)
Pt sts she no longer wants to be seen and is leaving.  RN notified.

## 2018-06-16 ENCOUNTER — Emergency Department (HOSPITAL_COMMUNITY)
Admission: EM | Admit: 2018-06-16 | Discharge: 2018-06-16 | Discharge status: Against Medical Advice | Payer: Medicaid Other | Attending: Emergency Medicine | Admitting: Emergency Medicine

## 2018-06-16 ENCOUNTER — Encounter (HOSPITAL_COMMUNITY): Payer: Self-pay | Admitting: Emergency Medicine

## 2018-06-16 DIAGNOSIS — S0101XA Laceration without foreign body of scalp, initial encounter: Secondary | ICD-10-CM

## 2018-06-16 DIAGNOSIS — Y929 Unspecified place or not applicable: Secondary | ICD-10-CM | POA: Insufficient documentation

## 2018-06-16 DIAGNOSIS — Y999 Unspecified external cause status: Secondary | ICD-10-CM | POA: Insufficient documentation

## 2018-06-16 DIAGNOSIS — F17290 Nicotine dependence, other tobacco product, uncomplicated: Secondary | ICD-10-CM | POA: Insufficient documentation

## 2018-06-16 DIAGNOSIS — Y939 Activity, unspecified: Secondary | ICD-10-CM | POA: Insufficient documentation

## 2018-06-16 DIAGNOSIS — Z79899 Other long term (current) drug therapy: Secondary | ICD-10-CM | POA: Insufficient documentation

## 2018-06-16 DIAGNOSIS — Z5329 Procedure and treatment not carried out because of patient's decision for other reasons: Secondary | ICD-10-CM | POA: Insufficient documentation

## 2018-06-16 MED ORDER — TETANUS-DIPHTH-ACELL PERTUSSIS 5-2.5-18.5 LF-MCG/0.5 IM SUSP: 0.5000 mL | Freq: Once | INTRAMUSCULAR | Status: DC
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
## 2018-06-16 NOTE — ED Notes (Signed)
Pt sts, "you guys are not doing it the legal way, I didn't sign out.  Whats the number to [facility] so I can see how long their wait is."  Writer explained to pt that she told me she was leaving and that is why she was not moved to a room as of yet and was moved to OTF.  Writer also explained to the pt that she was not seen by a provider, only triaged, so there is no sign out needed.  Pt remains in the lobby.
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
## 2018-06-16 NOTE — ED Notes (Addendum)
Pt presents to this EMT and registration at [facility] requesting information on a "color alert." Pt asked to further identify what she is referring to so we can best assist her. Pt states again "is there a color alert or something?"   Pt returns a minute later asking "what is that bird?" This EMT assures pt that there are no birds in the waiting room. Pt appears to be confused and is unable to answer questions appropriately. Pt's questions are disheveled, non-coherent. This EMT assured the pt she would be seen by a provider but that it was in her best interest to remain in the emergency department until that happens.
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
## 2018-06-17 ENCOUNTER — Emergency Department (HOSPITAL_COMMUNITY)
Admission: EM | Admit: 2018-06-17 | Discharge: 2018-06-17 | Disposition: A | Discharge status: Home / Self Care | Payer: Medicaid Other | Attending: Emergency Medicine | Admitting: Emergency Medicine

## 2018-06-17 ENCOUNTER — Other Ambulatory Visit: Payer: Self-pay

## 2018-06-17 ENCOUNTER — Encounter (HOSPITAL_COMMUNITY): Payer: Self-pay

## 2018-06-17 DIAGNOSIS — Z3200 Encounter for pregnancy test, result unknown: Secondary | ICD-10-CM

## 2018-06-17 DIAGNOSIS — F17210 Nicotine dependence, cigarettes, uncomplicated: Secondary | ICD-10-CM | POA: Insufficient documentation

## 2018-06-17 DIAGNOSIS — Z3202 Encounter for pregnancy test, result negative: Secondary | ICD-10-CM | POA: Insufficient documentation

## 2018-06-17 DIAGNOSIS — Z79899 Other long term (current) drug therapy: Secondary | ICD-10-CM | POA: Insufficient documentation

## 2018-06-17 DIAGNOSIS — Z5189 Encounter for other specified aftercare: Secondary | ICD-10-CM

## 2018-06-17 LAB — POC URINE PREG, ED: PREG TEST UR: NEGATIVE

## 2018-06-17 NOTE — ED Notes (Signed)
06/17/2018, Pt. Called for urine pregnancy results.  Results are Negative and informed pt. Of results. All questions answered.
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
## 2018-06-17 NOTE — ED Notes (Signed)
Patient verbalizes understanding of discharge instructions. Opportunity for questioning and answers were provided. Armband removed by staff, pt discharged from ED ambulatory.   
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
## 2018-06-18 ENCOUNTER — Emergency Department (HOSPITAL_COMMUNITY)
Admission: EM | Admit: 2018-06-18 | Discharge: 2018-06-18 | Disposition: A | Discharge status: Home / Self Care | Payer: Medicaid Other | Attending: Emergency Medicine | Admitting: Emergency Medicine

## 2018-06-18 ENCOUNTER — Other Ambulatory Visit: Payer: Self-pay

## 2018-06-18 ENCOUNTER — Encounter (HOSPITAL_COMMUNITY): Payer: Self-pay | Admitting: *Deleted

## 2018-06-18 ENCOUNTER — Emergency Department (HOSPITAL_COMMUNITY)
Admission: EM | Admit: 2018-06-18 | Discharge: 2018-06-18 | Discharge status: Against Medical Advice | Payer: Self-pay | Attending: Emergency Medicine | Admitting: Emergency Medicine

## 2018-06-18 ENCOUNTER — Encounter (HOSPITAL_COMMUNITY): Payer: Self-pay

## 2018-06-18 DIAGNOSIS — R030 Elevated blood-pressure reading, without diagnosis of hypertension: Secondary | ICD-10-CM | POA: Insufficient documentation

## 2018-06-18 DIAGNOSIS — M79643 Pain in unspecified hand: Secondary | ICD-10-CM | POA: Insufficient documentation

## 2018-06-18 DIAGNOSIS — F17290 Nicotine dependence, other tobacco product, uncomplicated: Secondary | ICD-10-CM | POA: Insufficient documentation

## 2018-06-18 DIAGNOSIS — Z79899 Other long term (current) drug therapy: Secondary | ICD-10-CM | POA: Insufficient documentation

## 2018-06-18 DIAGNOSIS — F209 Schizophrenia, unspecified: Secondary | ICD-10-CM | POA: Insufficient documentation

## 2018-06-18 DIAGNOSIS — Z0471 Encounter for examination and observation following alleged adult physical abuse: Secondary | ICD-10-CM | POA: Insufficient documentation

## 2018-06-18 DIAGNOSIS — Z23 Encounter for immunization: Secondary | ICD-10-CM | POA: Insufficient documentation

## 2018-06-18 DIAGNOSIS — Z0130 Encounter for examination of blood pressure without abnormal findings: Secondary | ICD-10-CM

## 2018-06-18 DIAGNOSIS — M79673 Pain in unspecified foot: Secondary | ICD-10-CM | POA: Insufficient documentation

## 2018-06-18 DIAGNOSIS — F259 Schizoaffective disorder, unspecified: Secondary | ICD-10-CM | POA: Insufficient documentation

## 2018-06-18 DIAGNOSIS — Z590 Homelessness: Secondary | ICD-10-CM | POA: Insufficient documentation

## 2018-06-18 MED ORDER — TETANUS-DIPHTH-ACELL PERTUSSIS 5-2.5-18.5 LF-MCG/0.5 IM SUSP
0.5000 mL | Freq: Once | INTRAMUSCULAR | Status: AC
  Administered 2018-06-18: 0.5 mL via INTRAMUSCULAR
  Filled 2018-06-18: qty 0.5

## 2018-06-18 NOTE — ED Notes (Signed)
Bed: WTR5 Expected date:  Expected time:  Means of arrival:  Comments: 
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
## 2018-06-18 NOTE — ED Notes (Signed)
Pt not seen in lobby x two when ready for a room

## 2018-06-18 NOTE — ED Notes (Signed)
Bed: WA02 Expected date:  Expected time:  Means of arrival:  Comments: 

## 2018-06-18 NOTE — ED Notes (Signed)
Bed: WA03 Expected date:  Expected time:  Means of arrival:  Comments: 

## 2018-06-18 NOTE — ED Notes (Signed)
Patient verbalizes understanding of discharge instructions. Opportunity for questioning and answers were provided. Armband removed by staff, pt discharged from ED home via foot.

## 2018-06-18 NOTE — ED Notes (Addendum)
Pt verbalized discharge understanding. Stated she was in no pain. Ambulatory with no assistance. Armband was cut off per request
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
## 2018-06-18 NOTE — ED Notes (Addendum)
Patient had TTS consult. TTS determined that patient can be discharged. PA was notified of TTS consult decision. Patient denies SI or HI. Patient wants to leave without receiving discharged paperwork. Patient states I need to leave to catch the bus [place] take me to the train. Patient would not wait to receive further instructions. Patient signed AMA paperwork on the Dispo tab.

## 2018-06-18 NOTE — ED Provider Notes (Signed)
[facility]-EMERGENCY DEPT Provider Note   CSN: [id number] Arrival date & time: 06/18/18  4098     History   Chief Complaint No chief complaint on file.   HPI Kelly Crosby is a 27 y.o. female.  HPI  Patient is a 27 year old female with a history of anxiety, depression, schizoaffective disorder presenting for need for vital sign recheck.  She reports that she was discharged a couple hours ago, and "it was cold outside", so she wanted to come back and get her vital signs rechecked.  Immediately upon my evaluation, patient was stating that she wanted to leave because she needed to go to [place].  Patient reports she is "confused" by the instructions of the nurses, and states "I have a mental disorder" so "I cannot be confused".  Patient denies any other concerns.  She denies feeling unsafe.  When asked about where she is living right now, she states "I do not want to tell you that". No other complaints.  Past Medical History:  Diagnosis Date  . A-fib (HCC)   . Anxiety   . Depression   . Schizotaxia     Patient Active Problem List   Diagnosis Date Noted  . Schizoaffective disorder (HCC) 09/26/2016  . Cannabis use disorder, severe, dependence (HCC) 09/26/2016    Past Surgical History:  Procedure Laterality Date  . NO PAST SURGERIES       OB History   None      Home Medications    Prior to Admission medications   Medication Sig Start Date End Date Taking? Authorizing Provider  ibuprofen (ADVIL,MOTRIN) 600 MG tablet Take 1 tablet (600 mg total) by mouth every 6 (six) hours as needed. 06/15/18  Yes Antony Madura, PA-C  benzonatate (TESSALON) 100 MG capsule Take 1 capsule (100 mg total) by mouth every 8 (eight) hours. Patient not taking: Reported on 06/14/2018 05/23/18   Janne Napoleon, NP  metroNIDAZOLE (FLAGYL) 500 MG tablet Take 1 tablet (500 mg total) by mouth 2 (two) times daily. Patient not taking: Reported on 06/14/2018 05/23/18   Janne Napoleon, NP    predniSONE (DELTASONE) 10 MG tablet Take 4 tablets (40 mg total) by mouth daily with breakfast. Patient not taking: Reported on 06/14/2018 05/23/18   Janne Napoleon, NP    Family History Family History  Problem Relation Age of Onset  . Hypertension Other     Social History Social History   Tobacco Use  . Smoking status: Current Some Day Smoker    Types: Cigars  . Smokeless tobacco: Never Used  Substance Use Topics  . Alcohol use: Yes  . Drug use: Yes    Types: Marijuana     Allergies   Amoxicillin   Review of Systems Review of Systems  HENT: Negative for congestion and rhinorrhea.   Respiratory: Negative for shortness of breath.   Neurological: Negative for headaches.  Psychiatric/Behavioral: Negative for agitation, decreased concentration and dysphoric mood.  All other systems reviewed and are negative.     Physical Exam Updated Vital Signs BP (!) 142/99 (BP Location: Right Arm)   Pulse 80   Temp 98 F (36.7 C) (Oral)   Resp 19   LMP 06/02/2018   SpO2 99%   Physical Exam  Constitutional: She appears well-developed and well-nourished. No distress.  Sitting comfortably in bed.  HENT:  Head: Normocephalic and atraumatic.  Eyes: Conjunctivae are normal. Right eye exhibits no discharge. Left eye exhibits no discharge.  EOMs normal to gross  examination.  Neck: Normal range of motion.  Cardiovascular: Normal rate and regular rhythm.  Intact, 2+ radial pulse.  Pulmonary/Chest:  Normal respiratory effort. Patient converses comfortably. No audible wheeze or stridor.  Abdominal: She exhibits no distension.  Musculoskeletal: Normal range of motion.  Neurological: She is alert.  Cranial nerves intact to gross observation. Patient moves extremities without difficulty.  Skin: Skin is warm and dry. She is not diaphoretic.  Psychiatric:  Patient is alert and oriented x4.  Flattened affect.  Normal annunciation however patient frequently substitutes inappropriate  words when speaking.   Speech is goal-directed.  No flight of ideas. Does not appear paranoid.  Is not responding to internal stimuli.  Nursing note and vitals reviewed.    ED Treatments / Results  Labs (all labs ordered are listed, but only abnormal results are displayed) Labs Reviewed - No data to display  EKG None  Radiology No results found.  Procedures Procedures (including critical care time)  Medications Ordered in ED Medications - No data to display   Initial Impression / Assessment and Plan / ED Course  I have reviewed the triage vital signs and the nursing notes.  Pertinent labs & imaging results that were available during my care of the patient were reviewed by me and considered in my medical decision making (see chart for details).  Clinical Course as of Jun 18 810  Caleen Essex Jun 18, 2018  1191 Patient left without being able to receive resource documents.  Patient is of adequate decision-making capacity to decide to leave.   [AM]    Clinical Course User Index [AM] Elisha Ponder, PA-C    Patient is nontoxic-appearing and in no acute distress.  Vital signs were rechecked per patient's request, and she is slightly hypertensive at 142/99. Patient is not acutely psychotic.  She does exhibit some mild confusion, which appears consistent with prior exams.  She does not appear to be a danger to herself or others.  Due to the multiple re-presentations this morning, patient's history of schizoaffective disorder and psychotic breaks, patient was evaluated by Theodoro Grist, TTS consultant who is familiar with patient.  She is stable for discharge per his recommendations.  Patient has been given multiple housing resources both in [place], [place] and [place], [place].  Also recommended patient receive resources and discharge packet, she was unable to stay, and left with bus pass.    Final Clinical Impressions(s) / ED Diagnoses   Final diagnoses:  Encounter for blood  pressure examination  Elevated blood pressure reading without diagnosis of hypertension    ED Discharge Orders    None       Delia Chimes 06/18/18 0818    Zadie Rhine, MD 06/19/18 0008

## 2018-06-19 ENCOUNTER — Encounter (HOSPITAL_COMMUNITY): Payer: Self-pay | Admitting: Emergency Medicine

## 2018-06-19 ENCOUNTER — Emergency Department (HOSPITAL_COMMUNITY)
Admission: EM | Admit: 2018-06-19 | Discharge: 2018-06-19 | Disposition: A | Discharge status: Home / Self Care | Payer: Medicaid Other | Attending: Emergency Medicine | Admitting: Emergency Medicine

## 2018-06-19 DIAGNOSIS — Z79899 Other long term (current) drug therapy: Secondary | ICD-10-CM | POA: Insufficient documentation

## 2018-06-19 DIAGNOSIS — S80211A Abrasion, right knee, initial encounter: Secondary | ICD-10-CM | POA: Insufficient documentation

## 2018-06-19 DIAGNOSIS — Y929 Unspecified place or not applicable: Secondary | ICD-10-CM | POA: Insufficient documentation

## 2018-06-19 DIAGNOSIS — W19XXXA Unspecified fall, initial encounter: Secondary | ICD-10-CM

## 2018-06-19 DIAGNOSIS — Y9389 Activity, other specified: Secondary | ICD-10-CM | POA: Insufficient documentation

## 2018-06-19 DIAGNOSIS — F17290 Nicotine dependence, other tobacco product, uncomplicated: Secondary | ICD-10-CM | POA: Insufficient documentation

## 2018-06-19 DIAGNOSIS — W010XXA Fall on same level from slipping, tripping and stumbling without subsequent striking against object, initial encounter: Secondary | ICD-10-CM | POA: Insufficient documentation

## 2018-06-19 DIAGNOSIS — Y999 Unspecified external cause status: Secondary | ICD-10-CM | POA: Insufficient documentation

## 2018-06-19 DIAGNOSIS — S0083XA Contusion of other part of head, initial encounter: Secondary | ICD-10-CM | POA: Insufficient documentation

## 2018-06-19 MED ORDER — ALBUTEROL SULFATE HFA 108 (90 BASE) MCG/ACT IN AERS: 1.0000 | INHALATION_SPRAY | Freq: Four times a day (QID) | RESPIRATORY_TRACT | 0 refills | Status: DC | PRN

## 2018-06-19 MED ORDER — IBUPROFEN 600 MG PO TABS: 600.0000 mg | ORAL_TABLET | Freq: Four times a day (QID) | ORAL | 0 refills | Status: DC | PRN

## 2018-06-19 MED ORDER — IBUPROFEN 800 MG PO TABS
800.0000 mg | ORAL_TABLET | Freq: Once | ORAL | Status: AC
  Administered 2018-06-19: 800 mg via ORAL
  Filled 2018-06-19: qty 1

## 2018-06-19 NOTE — ED Notes (Signed)
ED Provider at bedside. 

## 2018-06-19 NOTE — ED Notes (Signed)
Patient verbalizes understanding of discharge instructions. Opportunity for questioning and answers were provided. Armband removed by staff, pt discharged from ED ambulatory.   
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
## 2018-06-22 ENCOUNTER — Encounter (HOSPITAL_COMMUNITY): Payer: Self-pay | Admitting: Emergency Medicine

## 2018-06-22 ENCOUNTER — Other Ambulatory Visit: Payer: Self-pay

## 2018-06-22 ENCOUNTER — Emergency Department (HOSPITAL_COMMUNITY)
Admission: EM | Admit: 2018-06-22 | Discharge: 2018-06-22 | Disposition: A | Discharge status: Home / Self Care | Payer: Medicaid Other | Attending: Emergency Medicine | Admitting: Emergency Medicine

## 2018-06-22 DIAGNOSIS — F17290 Nicotine dependence, other tobacco product, uncomplicated: Secondary | ICD-10-CM | POA: Insufficient documentation

## 2018-06-22 DIAGNOSIS — Z202 Contact with and (suspected) exposure to infections with a predominantly sexual mode of transmission: Secondary | ICD-10-CM | POA: Insufficient documentation

## 2018-06-22 DIAGNOSIS — I4891 Unspecified atrial fibrillation: Secondary | ICD-10-CM | POA: Insufficient documentation

## 2018-06-22 DIAGNOSIS — R1030 Lower abdominal pain, unspecified: Secondary | ICD-10-CM | POA: Insufficient documentation

## 2018-06-22 DIAGNOSIS — Z4802 Encounter for removal of sutures: Secondary | ICD-10-CM

## 2018-06-22 LAB — URINALYSIS, ROUTINE W REFLEX MICROSCOPIC
Bacteria, UA: NONE SEEN
Bilirubin Urine: NEGATIVE
Glucose, UA: NEGATIVE mg/dL
Hgb urine dipstick: NEGATIVE
Ketones, ur: 5 mg/dL — AB
NITRITE: NEGATIVE
PROTEIN: NEGATIVE mg/dL
SPECIFIC GRAVITY, URINE: 1.028 (ref 1.005–1.030)
pH: 6 (ref 5.0–8.0)

## 2018-06-22 LAB — PREGNANCY, URINE: Preg Test, Ur: NEGATIVE

## 2018-06-22 NOTE — ED Notes (Signed)
Spoke with Amy in the lab urine preg is currently in process
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
## 2018-06-22 NOTE — ED Notes (Signed)
Patient requested to eat food in room. Patient discharged and will leave after eating.

## 2018-06-22 NOTE — ED Triage Notes (Signed)
Pt presents via EMS with complaints of lower abdominal pain and pressure when urinating. Patient seen here recently and treated for possible STD exposure. Patient states she has been taking her meds. Patient believes she is pregnant although she has had multiple negative tests.

## 2018-06-22 NOTE — Discharge Instructions (Addendum)
Return here as needed. Follow up with your doctor. °

## 2018-06-25 ENCOUNTER — Other Ambulatory Visit: Payer: Self-pay

## 2018-06-25 ENCOUNTER — Encounter (HOSPITAL_COMMUNITY): Payer: Self-pay

## 2018-06-25 ENCOUNTER — Emergency Department (HOSPITAL_COMMUNITY)
Admission: EM | Admit: 2018-06-25 | Discharge: 2018-06-25 | Disposition: A | Discharge status: Home / Self Care | Payer: Self-pay | Attending: Emergency Medicine | Admitting: Emergency Medicine

## 2018-06-25 DIAGNOSIS — R109 Unspecified abdominal pain: Secondary | ICD-10-CM | POA: Insufficient documentation

## 2018-06-25 DIAGNOSIS — Z79899 Other long term (current) drug therapy: Secondary | ICD-10-CM | POA: Insufficient documentation

## 2018-06-25 LAB — CBC WITH DIFFERENTIAL/PLATELET
ABS IMMATURE GRANULOCYTES: 0.02 10*3/uL (ref 0.00–0.07)
BASOS PCT: 1 %
Basophils Absolute: 0.1 10*3/uL (ref 0.0–0.1)
Eosinophils Absolute: 0.2 10*3/uL (ref 0.0–0.5)
Eosinophils Relative: 2 %
HCT: 40.4 % (ref 36.0–46.0)
Hemoglobin: 13.6 g/dL (ref 12.0–15.0)
IMMATURE GRANULOCYTES: 0 %
LYMPHS PCT: 35 %
Lymphs Abs: 3.1 10*3/uL (ref 0.7–4.0)
MCH: 28.9 pg (ref 26.0–34.0)
MCHC: 33.7 g/dL (ref 30.0–36.0)
MCV: 86 fL (ref 80.0–100.0)
Monocytes Absolute: 0.8 10*3/uL (ref 0.1–1.0)
Monocytes Relative: 9 %
NRBC: 0 % (ref 0.0–0.2)
Neutro Abs: 4.7 10*3/uL (ref 1.7–7.7)
Neutrophils Relative %: 53 %
PLATELETS: 305 10*3/uL (ref 150–400)
RBC: 4.7 MIL/uL (ref 3.87–5.11)
RDW: 13.6 % (ref 11.5–15.5)
WBC: 8.7 10*3/uL (ref 4.0–10.5)

## 2018-06-25 LAB — COMPREHENSIVE METABOLIC PANEL
ALT: 20 U/L (ref 0–44)
AST: 17 U/L (ref 15–41)
Albumin: 4.3 g/dL (ref 3.5–5.0)
Alkaline Phosphatase: 60 U/L (ref 38–126)
Anion gap: 7 (ref 5–15)
BUN: 11 mg/dL (ref 6–20)
CHLORIDE: 106 mmol/L (ref 98–111)
CO2: 28 mmol/L (ref 22–32)
CREATININE: 0.67 mg/dL (ref 0.44–1.00)
Calcium: 9.2 mg/dL (ref 8.9–10.3)
GFR calc Af Amer: >60 mL/min (ref >60)
GFR calc non Af Amer: >60 mL/min (ref >60)
GLUCOSE: 65 mg/dL — AB (ref 70–99)
Potassium: 3.8 mmol/L (ref 3.5–5.1)
SODIUM: 141 mmol/L (ref 135–145)
Total Bilirubin: 0.4 mg/dL (ref 0.3–1.2)
Total Protein: 7.2 g/dL (ref 6.5–8.1)

## 2018-06-25 LAB — URINALYSIS, ROUTINE W REFLEX MICROSCOPIC
Bilirubin Urine: NEGATIVE
GLUCOSE, UA: NEGATIVE mg/dL
HGB URINE DIPSTICK: NEGATIVE
Ketones, ur: 5 mg/dL — AB
Leukocytes, UA: NEGATIVE
Nitrite: NEGATIVE
Protein, ur: NEGATIVE mg/dL
SPECIFIC GRAVITY, URINE: 1.034 — AB (ref 1.005–1.030)
pH: 5 (ref 5.0–8.0)

## 2018-06-25 LAB — I-STAT BETA HCG BLOOD, ED (MC, WL, AP ONLY): I-stat hCG, quantitative: <5 m[IU]/mL (ref <5)

## 2018-06-25 LAB — LIPASE, BLOOD: Lipase: 48 U/L (ref 11–51)

## 2018-06-25 MED ORDER — KETOROLAC TROMETHAMINE 30 MG/ML IJ SOLN: 15.0000 mg | Freq: Once | INTRAMUSCULAR | Status: DC

## 2018-06-25 MED ORDER — ONDANSETRON HCL 4 MG/2ML IJ SOLN
4.0000 mg | Freq: Once | INTRAMUSCULAR | Status: AC
  Administered 2018-06-25: 4 mg via INTRAVENOUS
  Filled 2018-06-25: qty 2

## 2018-06-25 MED ORDER — ACETAMINOPHEN 500 MG PO TABS
1000.0000 mg | ORAL_TABLET | Freq: Once | ORAL | Status: AC
  Administered 2018-06-25: 1000 mg via ORAL
  Filled 2018-06-25: qty 2

## 2018-06-25 NOTE — Discharge Instructions (Signed)
Please follow up with your doctor °Return if you are worsening °

## 2018-06-25 NOTE — ED Triage Notes (Signed)
[place]. Patient reports LLQ abdominal pain since 1600 today. Patient also reports she thinks she is pregnant, despite multiple negative tests. Patient has been seen at repeatedly in the past 2 weeks for the same.

## 2018-06-25 NOTE — ED Provider Notes (Signed)
[facility]-EMERGENCY DEPT Provider Note   CSN: [id number] Arrival date & time: 06/25/18  1837     History   Chief Complaint Chief Complaint  Patient presents with  . Abdominal Pain    HPI Kelly Crosby is a 27 y.o. female who presents with abdominal pain. PMH significant for schizophrenia, frequent ED visits, substance abuse, homelessness. This is her 11th visit in the past month. She states she has LLQ that started around 4PM today. It is sharp and achy. It is constant and non-radiating. She has never had this before. She denies fevers, nausea, vomiting, diarrhea, urinary symptoms, vaginal bleeding or discharge. No prior abdominal surgeries. She states that other people around her have been vomiting. She had a pelvic exam on 10/13 and was treated for BV and then came back on 10/17 and was treated for gonorrhea. She has had multiple negative pregnancy tests but still believes she is pregnant. She was evaluated by TTS on 11/4 and cleared.   HPI  Past Medical History:  Diagnosis Date  . A-fib (HCC)   . Anxiety   . Depression   . Schizotaxia     Patient Active Problem List   Diagnosis Date Noted  . Schizoaffective disorder (HCC) 09/26/2016  . Cannabis use disorder, severe, dependence (HCC) 09/26/2016    Past Surgical History:  Procedure Laterality Date  . NO PAST SURGERIES       OB History   None      Home Medications    Prior to Admission medications   Medication Sig Start Date End Date Taking? Authorizing Provider  albuterol (PROVENTIL HFA;VENTOLIN HFA) 108 (90 Base) MCG/ACT inhaler Inhale 1-2 puffs into the lungs every 6 (six) hours as needed for wheezing or shortness of breath. 06/19/18   Elson Areas, PA-C  benzonatate (TESSALON) 100 MG capsule Take 1 capsule (100 mg total) by mouth every 8 (eight) hours. Patient not taking: Reported on 06/14/2018 05/23/18   Janne Napoleon, NP  ibuprofen (ADVIL,MOTRIN) 600 MG tablet Take 1 tablet (600 mg  total) by mouth every 6 (six) hours as needed. 06/19/18   Elson Areas, PA-C  metroNIDAZOLE (FLAGYL) 500 MG tablet Take 1 tablet (500 mg total) by mouth 2 (two) times daily. Patient not taking: Reported on 06/14/2018 05/23/18   Janne Napoleon, NP  predniSONE (DELTASONE) 10 MG tablet Take 4 tablets (40 mg total) by mouth daily with breakfast. Patient not taking: Reported on 06/14/2018 05/23/18   Janne Napoleon, NP    Family History Family History  Problem Relation Age of Onset  . Hypertension Other     Social History Social History   Tobacco Use  . Smoking status: Current Some Day Smoker    Types: Cigars  . Smokeless tobacco: Never Used  Substance Use Topics  . Alcohol use: Yes  . Drug use: Yes    Types: Marijuana     Allergies   Amoxicillin   Review of Systems Review of Systems  Constitutional: Negative for fever.  Respiratory: Negative for shortness of breath.   Cardiovascular: Negative for chest pain.  Gastrointestinal: Positive for abdominal pain and constipation. Negative for diarrhea, nausea and vomiting.  Genitourinary: Negative for dysuria, menstrual problem, vaginal bleeding and vaginal discharge.  All other systems reviewed and are negative.    Physical Exam Updated Vital Signs BP (!) 135/94 (BP Location: Left Arm)   Pulse 75   Temp 98.1 F (36.7 C) (Oral)   Resp 14   Ht 5'  6" (1.676 m)   Wt 99.6 kg   LMP 06/02/2018   SpO2 100%   BMI 35.44 kg/m   Physical Exam  Constitutional: She is oriented to person, place, and time. She appears well-developed and well-nourished. No distress.  Calm and cooperative, NAD  HENT:  Head: Normocephalic and atraumatic.  Eyes: Pupils are equal, round, and reactive to light. Conjunctivae are normal. Right eye exhibits no discharge. Left eye exhibits no discharge. No scleral icterus.  Neck: Normal range of motion.  Cardiovascular: Normal rate and regular rhythm.  Pulmonary/Chest: Effort normal and breath sounds normal.  No respiratory distress.  Abdominal: Soft. Bowel sounds are normal. She exhibits no distension and no mass. There is tenderness (LLQ). There is no rebound and no guarding. No hernia.  Neurological: She is alert and oriented to person, place, and time.  Skin: Skin is warm and dry.  Psychiatric: She has a normal mood and affect. Her behavior is normal.  Nursing note and vitals reviewed.    ED Treatments / Results  Labs (all labs ordered are listed, but only abnormal results are displayed) Labs Reviewed  COMPREHENSIVE METABOLIC PANEL - Abnormal; Notable for the following components:      Result Value   Glucose, Bld 65 (*)    All other components within normal limits  URINALYSIS, ROUTINE W REFLEX MICROSCOPIC - Abnormal; Notable for the following components:   Specific Gravity, Urine 1.034 (*)    Ketones, ur 5 (*)    All other components within normal limits  CBC WITH DIFFERENTIAL/PLATELET  LIPASE, BLOOD  I-STAT BETA HCG BLOOD, ED ([facility], [facility], [facility] ONLY)    EKG None  Radiology No results found.  Procedures Procedures (including critical care time)  Medications Ordered in ED Medications  ondansetron (ZOFRAN) injection 4 mg (has no administration in time range)  acetaminophen (TYLENOL) tablet 1,000 mg (has no administration in time range)     Initial Impression / Assessment and Plan / ED Course  I have reviewed the triage vital signs and the nursing notes.  Pertinent labs & imaging results that were available during my care of the patient were reviewed by me and considered in my medical decision making (see chart for details).  27 year old female presents with left sided abdominal pain and constipation. Vitals are normal. Abdomen is mildly tender in LLQ. Will obtain labs, UA, and give pain and nausea medicine.  Labs are normal other than mild hypoglycemia. UA has 5 ketones and is concentrated. Will PO challenge.   Dicussed results with pt. She verbalizes understanding of results  but is very sleepy. Question secondary gain from homelessness? With reassuring vitals and labs, will not obtain imaging at this time. She tolerated PO. Will d/c   Final Clinical Impressions(s) / ED Diagnoses   Final diagnoses:  Abdominal pain, unspecified abdominal location    ED Discharge Orders    None       Bethel BornGekas, Jaylee Lantry Marie, PA-C 06/25/18 2254    Lorre NickAllen, Anthony, MD 06/28/18 1334

## 2018-06-25 NOTE — ED Notes (Signed)
Bed: ZO10WA05 Expected date:  Expected time:  Means of arrival:  Comments: EMS LLQ pain

## 2018-06-27 ENCOUNTER — Encounter (HOSPITAL_COMMUNITY): Payer: Self-pay | Admitting: *Deleted

## 2018-06-27 ENCOUNTER — Emergency Department (HOSPITAL_COMMUNITY)
Admission: EM | Admit: 2018-06-27 | Discharge: 2018-06-27 | Disposition: A | Discharge status: Home / Self Care | Payer: Medicaid Other | Attending: Emergency Medicine | Admitting: Emergency Medicine

## 2018-06-27 ENCOUNTER — Other Ambulatory Visit: Payer: Self-pay

## 2018-06-27 DIAGNOSIS — R109 Unspecified abdominal pain: Secondary | ICD-10-CM | POA: Insufficient documentation

## 2018-06-27 DIAGNOSIS — F17290 Nicotine dependence, other tobacco product, uncomplicated: Secondary | ICD-10-CM | POA: Insufficient documentation

## 2018-06-27 DIAGNOSIS — I4891 Unspecified atrial fibrillation: Secondary | ICD-10-CM | POA: Insufficient documentation

## 2018-06-27 DIAGNOSIS — Z5900 Homelessness unspecified: Secondary | ICD-10-CM

## 2018-06-27 DIAGNOSIS — Z79899 Other long term (current) drug therapy: Secondary | ICD-10-CM | POA: Insufficient documentation

## 2018-06-27 LAB — COMPREHENSIVE METABOLIC PANEL
ALBUMIN: 4 g/dL (ref 3.5–5.0)
ALK PHOS: 55 U/L (ref 38–126)
ALT: 18 U/L (ref 0–44)
AST: 18 U/L (ref 15–41)
Anion gap: 5 (ref 5–15)
BILIRUBIN TOTAL: 0.5 mg/dL (ref 0.3–1.2)
BUN: 9 mg/dL (ref 6–20)
CO2: 26 mmol/L (ref 22–32)
CREATININE: 0.76 mg/dL (ref 0.44–1.00)
Calcium: 9 mg/dL (ref 8.9–10.3)
Chloride: 105 mmol/L (ref 98–111)
GFR calc Af Amer: >60 mL/min (ref >60)
GFR calc non Af Amer: >60 mL/min (ref >60)
GLUCOSE: 92 mg/dL (ref 70–99)
POTASSIUM: 3.5 mmol/L (ref 3.5–5.1)
Sodium: 136 mmol/L (ref 135–145)
TOTAL PROTEIN: 7 g/dL (ref 6.5–8.1)

## 2018-06-27 LAB — CBC
HEMATOCRIT: 39 % (ref 36.0–46.0)
HEMOGLOBIN: 13.4 g/dL (ref 12.0–15.0)
MCH: 28.5 pg (ref 26.0–34.0)
MCHC: 34.4 g/dL (ref 30.0–36.0)
MCV: 83 fL (ref 80.0–100.0)
Platelets: 319 10*3/uL (ref 150–400)
RBC: 4.7 MIL/uL (ref 3.87–5.11)
RDW: 13.3 % (ref 11.5–15.5)
WBC: 11.5 10*3/uL — ABNORMAL HIGH (ref 4.0–10.5)
nRBC: 0 % (ref 0.0–0.2)

## 2018-06-27 LAB — I-STAT BETA HCG BLOOD, ED (MC, WL, AP ONLY)

## 2018-06-27 LAB — LIPASE, BLOOD: Lipase: 38 U/L (ref 11–51)

## 2018-06-27 NOTE — ED Provider Notes (Signed)
[facility] EMERGENCY [facility]PARTMENT Provider Note   CSN: [id number] Arrival date & time: 06/27/18  0119     History   Chief Complaint Chief Complaint  Patient presents with  . Abdominal Pain    HPI Kelly Crosby is a 27 y.o. female.  Patient presents to the emergency department with chief complaint of abdominal pain.  She is unable to state clearly when the pain began.  She denies vomiting or diarrhea.  Denies fevers or chills.  Denies cough or shortness of breath or chest pain.  She states that she has some pain on the left side of her face.  She has been seen 11 times in the past 2 weeks.  She is homeless.  The history is provided by the patient. No language interpreter was used.    Past Medical History:  Diagnosis Date  . A-fib (HCC)   . Anxiety   . Depression   . Schizotaxia     Patient Active Problem List   Diagnosis Date Noted  . Schizoaffective disorder (HCC) 09/26/2016  . Cannabis use disorder, severe, dependence (HCC) 09/26/2016    Past Surgical History:  Procedure Laterality Date  . NO PAST SURGERIES       OB History   None      Home Medications    Prior to Admission medications   Medication Sig Start Date End Date Taking? Authorizing Provider  albuterol (PROVENTIL HFA;VENTOLIN HFA) 108 (90 Base) MCG/ACT inhaler Inhale 1-2 puffs into the lungs every 6 (six) hours as needed for wheezing or shortness of breath. 06/19/18   Elson AreasSofia, Leslie K, PA-C  ibuprofen (ADVIL,MOTRIN) 200 MG tablet Take 200 mg by mouth every 6 (six) hours as needed for moderate pain.    [provider]  ibuprofen (ADVIL,MOTRIN) 600 MG tablet Take 1 tablet (600 mg total) by mouth every 6 (six) hours as needed. 06/19/18   Elson AreasSofia, Leslie K, PA-C  Prenatal Vit-Fe Fumarate-FA (PRENATAL MULTIVITAMIN) TABS tablet Take 1 tablet by mouth every evening.    [provider]    Family History Family History  Problem Relation Age of Onset  . Hypertension Other      Social History Social History   Tobacco Use  . Smoking status: Current Some Day Smoker    Types: Cigars  . Smokeless tobacco: Never Used  Substance Use Topics  . Alcohol use: Yes  . Drug use: Yes    Types: Marijuana     Allergies   Amoxicillin   Review of Systems Review of Systems  All other systems reviewed and are negative.    Physical Exam Updated Vital Signs BP 117/80 (BP Location: Left Arm)   Pulse 65   Temp 97.9 F (36.6 C)   Resp 16   Ht 5\' 6"  (1.676 m)   Wt 95.3 kg   LMP 06/02/2018   SpO2 98%   BMI 33.89 kg/m   Physical Exam  Constitutional: She is oriented to person, place, and time. She appears well-developed and well-nourished.  HENT:  Head: Normocephalic and atraumatic.  Eyes: Pupils are equal, round, and reactive to light. Conjunctivae and EOM are normal.  Neck: Normal range of motion. Neck supple.  Cardiovascular: Normal rate and regular rhythm. Exam reveals no gallop and no friction rub.  No murmur heard. Pulmonary/Chest: Effort normal and breath sounds normal. No respiratory distress. She has no wheezes. She has no rales. She exhibits no tenderness.  Abdominal: Soft. Bowel sounds are normal. She exhibits no distension and no  mass. There is no tenderness. There is no rebound and no guarding.  No focal abdominal tenderness, no RLQ tenderness or pain at McBurney's point, no RUQ tenderness or Murphy's sign, no left-sided abdominal tenderness, no fluid wave, or signs of peritonitis   Musculoskeletal: Normal range of motion. She exhibits no edema or tenderness.  Neurological: She is alert and oriented to person, place, and time.  Skin: Skin is warm and dry.  Psychiatric: She has a normal mood and affect. Her behavior is normal. Judgment and thought content normal.  Nursing note and vitals reviewed.    ED Treatments / Results  Labs (all labs ordered are listed, but only abnormal results are displayed) Labs Reviewed  CBC - Abnormal; Notable  for the following components:      Result Value   WBC 11.5 (*)    All other components within normal limits  LIPASE, BLOOD  COMPREHENSIVE METABOLIC PANEL  I-STAT BETA HCG BLOOD, ED ([facility], [facility], [facility] ONLY)    EKG None  Radiology No results found.  Procedures Procedures (including critical care time)  Medications Ordered in ED Medications - No data to display   Initial Impression / Assessment and Plan / ED Course  I have reviewed the triage vital signs and the nursing notes.  Pertinent labs & imaging results that were available during my care of the patient were reviewed by me and considered in my medical decision making (see chart for details).    Patient with complaints of abdominal pain.  No focal tenderness on exam.  Laboratory work-up is reassuring.  Patient not pregnant.  Patient is homeless.  She has been seen 11 times in the past 2 weeks.  I do believe that she is just seeking some more warm to stay.  I have given her a resource guide with homeless shelters.  Final Clinical Impressions(s) / ED Diagnoses   Final diagnoses:  Homeless    ED Discharge Orders    None       Roxy Horseman, PA-C 06/27/18 0346    Glynn Octave, MD 06/27/18 [phone]

## 2018-06-27 NOTE — ED Triage Notes (Signed)
abd pain since yesterday no n v or diarrhea constipation and a mild headache  lmp October 6th

## 2018-06-27 NOTE — ED Notes (Signed)
The pt reports that she is homeless.  She reported that she needed to go to the restroom  She was in the br for approx 10 minutes then came out with an empty urine cup.  She stood in the room as I was going to  [facility] and she stated that she wanted a few minutes alone.. I left

## 2018-06-28 ENCOUNTER — Emergency Department (HOSPITAL_COMMUNITY)
Admission: EM | Admit: 2018-06-28 | Discharge: 2018-06-28 | Disposition: A | Discharge status: Home / Self Care | Payer: Self-pay | Attending: Emergency Medicine | Admitting: Emergency Medicine

## 2018-06-28 ENCOUNTER — Other Ambulatory Visit: Payer: Self-pay

## 2018-06-28 DIAGNOSIS — Z5900 Homelessness unspecified: Secondary | ICD-10-CM

## 2018-06-28 DIAGNOSIS — F17290 Nicotine dependence, other tobacco product, uncomplicated: Secondary | ICD-10-CM | POA: Insufficient documentation

## 2018-06-28 DIAGNOSIS — Z79899 Other long term (current) drug therapy: Secondary | ICD-10-CM | POA: Insufficient documentation

## 2018-06-28 DIAGNOSIS — R519 Headache, unspecified: Secondary | ICD-10-CM

## 2018-06-28 DIAGNOSIS — R51 Headache: Secondary | ICD-10-CM | POA: Insufficient documentation

## 2018-06-28 MED ORDER — ACETAMINOPHEN 500 MG PO TABS
1000.0000 mg | ORAL_TABLET | Freq: Once | ORAL | Status: AC
  Administered 2018-06-28: 1000 mg via ORAL
  Filled 2018-06-28: qty 2

## 2018-06-28 NOTE — ED Notes (Signed)
Bed: WLPT4 Expected date:  Expected time:  Means of arrival:  Comments: 

## 2018-06-28 NOTE — ED Triage Notes (Signed)
Pt reports feeling more depressed than normal. Pt reports paranoia and suicide as well as homelessness.

## 2018-06-28 NOTE — ED Provider Notes (Signed)
[facility]-EMERGENCY DEPT Provider Note   CSN: [id number] Arrival date & time: 06/28/18  0009     History   Chief Complaint Chief Complaint  Patient presents with  . Psychiatric Evaluation    HPI Kelly Crosby is a 27 y.o. female.  Patient with history of anxiety, bipolar, schizoaffective, presents to the emergency department with chief complaint of homelessness.  She states that she has been feeling very tired today because she has been wondering about all day.  She states that she has a slight headache.  She states that she just needed some more to rest.  She initially claimed to be suicidal, but states now that she feels better that she has had some more to rest for a while.  She denies suicidal ideation now.  Denies any other complaints now.  The history is provided by the patient. No language interpreter was used.    Past Medical History:  Diagnosis Date  . A-fib (HCC)   . Anxiety   . Depression   . Schizotaxia     Patient Active Problem List   Diagnosis Date Noted  . Schizoaffective disorder (HCC) 09/26/2016  . Cannabis use disorder, severe, dependence (HCC) 09/26/2016    Past Surgical History:  Procedure Laterality Date  . NO PAST SURGERIES       OB History   None      Home Medications    Prior to Admission medications   Medication Sig Start Date End Date Taking? Authorizing Provider  ibuprofen (ADVIL,MOTRIN) 200 MG tablet Take 200 mg by mouth every 6 (six) hours as needed for moderate pain.   Yes [provider]  Prenatal Vit-Fe Fumarate-FA (PRENATAL MULTIVITAMIN) TABS tablet Take 1 tablet by mouth every evening.   Yes [provider]  albuterol (PROVENTIL HFA;VENTOLIN HFA) 108 (90 Base) MCG/ACT inhaler Inhale 1-2 puffs into the lungs every 6 (six) hours as needed for wheezing or shortness of breath. 06/19/18   Elson AreasSofia, Leslie K, PA-C  ibuprofen (ADVIL,MOTRIN) 600 MG tablet Take 1 tablet (600 mg total) by mouth every  6 (six) hours as needed. 06/19/18   Elson AreasSofia, Leslie K, PA-C    Family History Family History  Problem Relation Age of Onset  . Hypertension Other     Social History Social History   Tobacco Use  . Smoking status: Current Some Day Smoker    Types: Cigars  . Smokeless tobacco: Never Used  Substance Use Topics  . Alcohol use: Yes  . Drug use: Yes    Types: Marijuana     Allergies   Amoxicillin   Review of Systems Review of Systems  All other systems reviewed and are negative.    Physical Exam Updated Vital Signs BP 128/81 (BP Location: Left Arm)   Pulse 78   Temp 97.9 F (36.6 C) (Oral)   Resp 15   Ht 5\' 8"  (1.727 m)   Wt 99.8 kg   LMP 06/02/2018   SpO2 100%   BMI 33.45 kg/m   Physical Exam  Constitutional: She is oriented to person, place, and time. She appears well-developed and well-nourished.  HENT:  Head: Normocephalic and atraumatic.  Eyes: Pupils are equal, round, and reactive to light. Conjunctivae and EOM are normal.  Neck: Normal range of motion. Neck supple.  Cardiovascular: Normal rate and regular rhythm. Exam reveals no gallop and no friction rub.  No murmur heard. Pulmonary/Chest: Effort normal and breath sounds normal. No respiratory distress. She has no wheezes. She has no  rales. She exhibits no tenderness.  Abdominal: Soft. Bowel sounds are normal. She exhibits no distension and no mass. There is no tenderness. There is no rebound and no guarding.  Musculoskeletal: Normal range of motion. She exhibits no edema or tenderness.  Neurological: She is alert and oriented to person, place, and time.  Skin: Skin is warm and dry.  Psychiatric: She has a normal mood and affect. Her behavior is normal. Judgment and thought content normal.  Nursing note and vitals reviewed.    ED Treatments / Results  Labs (all labs ordered are listed, but only abnormal results are displayed) Labs Reviewed - No data to display  EKG None  Radiology No results  found.  Procedures Procedures (including critical care time)  Medications Ordered in ED Medications  acetaminophen (TYLENOL) tablet 1,000 mg (has no administration in time range)     Initial Impression / Assessment and Plan / ED Course  I have reviewed the triage vital signs and the nursing notes.  Pertinent labs & imaging results that were available during my care of the patient were reviewed by me and considered in my medical decision making (see chart for details).     Patient initially claimed to be suicidal in triage, but on my evaluation she states that she is not.  She states she just wanted somewhere to stay.  I have provided her with resources for homeless shelters.  Also told her that she can wait in the waiting room until morning.  She is fine with this plan.  She is in no acute distress.  Not feel that she needs any further work-up or treatment in the emergency department tonight.  Final Clinical Impressions(s) / ED Diagnoses   Final diagnoses:  Homeless  Nonintractable headache, unspecified chronicity pattern, unspecified headache type    ED Discharge Orders    None       Roxy Horseman, PA-C 06/28/18 1610    Dione Booze, MD 06/28/18 [phone]

## 2018-06-29 ENCOUNTER — Emergency Department (HOSPITAL_COMMUNITY)
Admission: EM | Admit: 2018-06-29 | Discharge: 2018-06-29 | Disposition: A | Discharge status: Home / Self Care | Payer: Self-pay | Attending: Emergency Medicine | Admitting: Emergency Medicine

## 2018-06-29 ENCOUNTER — Other Ambulatory Visit: Payer: Self-pay

## 2018-06-29 ENCOUNTER — Encounter (HOSPITAL_COMMUNITY): Payer: Self-pay

## 2018-06-29 DIAGNOSIS — N939 Abnormal uterine and vaginal bleeding, unspecified: Secondary | ICD-10-CM | POA: Insufficient documentation

## 2018-06-29 DIAGNOSIS — F17210 Nicotine dependence, cigarettes, uncomplicated: Secondary | ICD-10-CM | POA: Insufficient documentation

## 2018-06-29 LAB — CBC
HCT: 37.9 % (ref 36.0–46.0)
HEMOGLOBIN: 13 g/dL (ref 12.0–15.0)
MCH: 28.4 pg (ref 26.0–34.0)
MCHC: 34.3 g/dL (ref 30.0–36.0)
MCV: 82.9 fL (ref 80.0–100.0)
PLATELETS: 315 10*3/uL (ref 150–400)
RBC: 4.57 MIL/uL (ref 3.87–5.11)
RDW: 13.5 % (ref 11.5–15.5)
WBC: 11.1 10*3/uL — ABNORMAL HIGH (ref 4.0–10.5)
nRBC: 0 % (ref 0.0–0.2)

## 2018-06-29 LAB — WET PREP, GENITAL
Sperm: NONE SEEN
Trich, Wet Prep: NONE SEEN
YEAST WET PREP: NONE SEEN

## 2018-06-29 LAB — BASIC METABOLIC PANEL
Anion gap: 9 (ref 5–15)
BUN: 7 mg/dL (ref 6–20)
CHLORIDE: 106 mmol/L (ref 98–111)
CO2: 23 mmol/L (ref 22–32)
CREATININE: 0.75 mg/dL (ref 0.44–1.00)
Calcium: 9 mg/dL (ref 8.9–10.3)
GFR calc Af Amer: >60 mL/min (ref >60)
GFR calc non Af Amer: >60 mL/min (ref >60)
Glucose, Bld: 105 mg/dL — ABNORMAL HIGH (ref 70–99)
Potassium: 3.6 mmol/L (ref 3.5–5.1)
SODIUM: 138 mmol/L (ref 135–145)

## 2018-06-29 LAB — I-STAT BETA HCG BLOOD, ED (MC, WL, AP ONLY)

## 2018-06-29 NOTE — ED Provider Notes (Signed)
[facility] Provider Note   CSN: [id number] Arrival date & time: 06/29/18  2146     History   Chief Complaint Chief Complaint  Patient presents with  . Vaginal Bleeding    HPI Kelly Crosby is a 27 y.o. female.  HPI Patient presents to the emergency room for evaluation of vaginal bleeding.  Patient has a history of mental health problems, homelessness and frequently visits the ED.  Patient states her last menstrual period was in October.  She started having vaginal bleeding today.  She feels that it is heavier than her normal cycle.  She denies any abdominal pain.  No fevers or chills. Past Medical History:  Diagnosis Date  . A-fib (HCC)   . Anxiety   . Depression   . Schizotaxia     Patient Active Problem List   Diagnosis Date Noted  . Schizoaffective disorder (HCC) 09/26/2016  . Cannabis use disorder, severe, dependence (HCC) 09/26/2016    Past Surgical History:  Procedure Laterality Date  . NO PAST SURGERIES       OB History   None      Home Medications    Prior to Admission medications   Medication Sig Start Date End Date Taking? Authorizing Provider  albuterol (PROVENTIL HFA;VENTOLIN HFA) 108 (90 Base) MCG/ACT inhaler Inhale 1-2 puffs into the lungs every 6 (six) hours as needed for wheezing or shortness of breath. 06/19/18   Elson AreasSofia, Leslie K, PA-C  ibuprofen (ADVIL,MOTRIN) 200 MG tablet Take 200 mg by mouth every 6 (six) hours as needed for moderate pain.    [provider]  ibuprofen (ADVIL,MOTRIN) 600 MG tablet Take 1 tablet (600 mg total) by mouth every 6 (six) hours as needed. 06/19/18   Elson AreasSofia, Leslie K, PA-C  Prenatal Vit-Fe Fumarate-FA (PRENATAL MULTIVITAMIN) TABS tablet Take 1 tablet by mouth every evening.    [provider]    Family History Family History  Problem Relation Age of Onset  . Hypertension Other     Social History Social History   Tobacco Use  . Smoking status: Current  Some Day Smoker    Types: Cigars  . Smokeless tobacco: Never Used  Substance Use Topics  . Alcohol use: Yes  . Drug use: Yes    Types: Marijuana     Allergies   Amoxicillin   Review of Systems Review of Systems  All other systems reviewed and are negative.    Physical Exam Updated Vital Signs BP 119/85   Pulse 93   Resp 16   LMP 06/02/2018   SpO2 100%   Physical Exam  Constitutional: She appears well-developed and well-nourished. No distress.  HENT:  Head: Normocephalic and atraumatic.  Right Ear: External ear normal.  Left Ear: External ear normal.  Eyes: Conjunctivae are normal. Right eye exhibits no discharge. Left eye exhibits no discharge. No scleral icterus.  Neck: Neck supple. No tracheal deviation present.  Cardiovascular: Normal rate, regular rhythm and intact distal pulses.  Pulmonary/Chest: Effort normal and breath sounds normal. No stridor. No respiratory distress. She has no wheezes. She has no rales.  Abdominal: Soft. Bowel sounds are normal. She exhibits no distension. There is no tenderness. There is no rebound and no guarding.  Genitourinary: Uterus normal. Cervix exhibits no discharge. Right adnexum displays no mass and no tenderness. Left adnexum displays no mass and no tenderness. There is bleeding in the vagina.  Musculoskeletal: She exhibits no edema or tenderness.  Neurological: She is alert. She has  normal strength. No cranial nerve deficit (no facial droop, extraocular movements intact, no slurred speech) or sensory deficit. She exhibits normal muscle tone. She displays no seizure activity. Coordination normal.  Skin: Skin is warm and dry. No rash noted.  Psychiatric: She has a normal mood and affect.  Nursing note and vitals reviewed.    ED Treatments / Results  Labs (all labs ordered are listed, but only abnormal results are displayed) Labs Reviewed  WET PREP, GENITAL - Abnormal; Notable for the following components:      Result Value    Clue Cells Wet Prep HPF POC PRESENT (*)    WBC, Wet Prep HPF POC MANY (*)    All other components within normal limits  CBC - Abnormal; Notable for the following components:   WBC 11.1 (*)    All other components within normal limits  BASIC METABOLIC PANEL - Abnormal; Notable for the following components:   Glucose, Bld 105 (*)    All other components within normal limits  RPR  HIV ANTIBODY (ROUTINE TESTING W REFLEX)  I-STAT BETA HCG BLOOD, ED ([facility], [facility], [facility] ONLY)  GC/CHLAMYDIA PROBE AMP ([place]) NOT AT [facility]    EKG None  Radiology No results found.  Procedures Procedures (including critical care time)  Medications Ordered in ED Medications - No data to display   Initial Impression / Assessment and Plan / ED Course  I have reviewed the triage vital signs and the nursing notes.  Pertinent labs & imaging results that were available during my care of the patient were reviewed by me and considered in my medical decision making (see chart for details).   Patient's evaluation is reassuring.  She has a normal hemoglobin.  Her pregnancy test is negative.  Patient does have many white blood cells and clue cells but she is not noticing any vaginal odor/discharge.  I do not think treatment is indicated.  I suspect sx are related to menses.  At this time there does not appear to be any evidence of an acute emergency medical condition and the patient appears stable for discharge with appropriate outpatient follow up.  Final Clinical Impressions(s) / ED Diagnoses   Final diagnoses:  Vaginal bleeding    ED Discharge Orders    None       Linwood Dibbles, MD 06/29/18 2323

## 2018-06-29 NOTE — ED Notes (Signed)
Patient laid back down and attempted to go back to sleep. Assisted pt to get dressed and walked patient to lobby. Patient requested cab voucher - per charge not available.

## 2018-06-29 NOTE — ED Notes (Signed)
Upon discharge, patient requested that I turn off the lights so that she can go back to sleep. Pt advised that it was time to get dressed as she is discharged.

## 2018-06-29 NOTE — ED Notes (Signed)
Patient not wanting to approach the desk.  States "I'm bleeding down there because I think I started my cycle".  Patient also states she would like to talk to [employer].  Otherwise, patient not answering questions at sort.

## 2018-06-29 NOTE — ED Triage Notes (Signed)
Pt reports heavy vaginal bleeding that started yesterday morning.

## 2018-06-29 NOTE — Discharge Instructions (Addendum)
Take tylenol or ibuprofen as needed, follow up with an OB GYN doctor if the bleeding does not resolve over the next week

## 2018-06-30 DIAGNOSIS — F17290 Nicotine dependence, other tobacco product, uncomplicated: Secondary | ICD-10-CM | POA: Insufficient documentation

## 2018-06-30 DIAGNOSIS — Z79899 Other long term (current) drug therapy: Secondary | ICD-10-CM | POA: Insufficient documentation

## 2018-06-30 DIAGNOSIS — Z711 Person with feared health complaint in whom no diagnosis is made: Secondary | ICD-10-CM | POA: Insufficient documentation

## 2018-06-30 LAB — GC/CHLAMYDIA PROBE AMP (~~LOC~~) NOT AT ARMC
Chlamydia: NEGATIVE
Neisseria Gonorrhea: NEGATIVE

## 2018-06-30 LAB — HIV ANTIBODY (ROUTINE TESTING W REFLEX): HIV SCREEN 4TH GENERATION: NONREACTIVE

## 2018-06-30 LAB — RPR: RPR: NONREACTIVE

## 2018-07-01 ENCOUNTER — Emergency Department (HOSPITAL_COMMUNITY)
Admission: EM | Admit: 2018-07-01 | Discharge: 2018-07-01 | Disposition: A | Discharge status: Home / Self Care | Payer: Self-pay | Attending: Emergency Medicine | Admitting: Emergency Medicine

## 2018-07-01 ENCOUNTER — Inpatient Hospital Stay (HOSPITAL_COMMUNITY)
Admission: AD | Admit: 2018-07-01 | Discharge: 2018-07-02 | Discharge status: Against Medical Advice | Payer: Self-pay | Source: Ambulatory Visit | Attending: Obstetrics and Gynecology | Admitting: Obstetrics and Gynecology

## 2018-07-01 ENCOUNTER — Other Ambulatory Visit: Payer: Self-pay

## 2018-07-01 DIAGNOSIS — R109 Unspecified abdominal pain: Secondary | ICD-10-CM

## 2018-07-01 DIAGNOSIS — R1032 Left lower quadrant pain: Secondary | ICD-10-CM | POA: Insufficient documentation

## 2018-07-01 DIAGNOSIS — Z5329 Procedure and treatment not carried out because of patient's decision for other reasons: Secondary | ICD-10-CM | POA: Insufficient documentation

## 2018-07-01 LAB — URINALYSIS, ROUTINE W REFLEX MICROSCOPIC
BILIRUBIN URINE: NEGATIVE
Glucose, UA: NEGATIVE mg/dL
HGB URINE DIPSTICK: NEGATIVE
KETONES UR: 20 mg/dL — AB
Leukocytes, UA: NEGATIVE
NITRITE: NEGATIVE
PROTEIN: NEGATIVE mg/dL
SPECIFIC GRAVITY, URINE: 1.031 — AB (ref 1.005–1.030)
pH: 5 (ref 5.0–8.0)

## 2018-07-01 NOTE — ED Triage Notes (Signed)
Pt reports being pregnant . [facility]. Pt reports abdominal pain and vaginal bleeding.
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
## 2018-07-01 NOTE — ED Provider Notes (Signed)
[facility]-EMERGENCY DEPT Provider Note   CSN: [id number] Arrival date & time: 06/30/18  2346     History   Chief Complaint Chief Complaint  Patient presents with  . Abdominal Pain    HPI Kelly Crosby is a 27 y.o. female with a history of mental health problems, homelessness and frequently visits the ED who presents the emergency department today reporting that she was cold outside.  Patient reported to triage note that she thought she may be pregnant and had some abdominal pain vaginal bleeding.  She reports that she was seen yesterday for this and had a reassuring work-up.  She states this is since stopped.  She states that nothing is currently bothering her right now.  She does she is currently on her menses.  No fever, chills.  HPI  Past Medical History:  Diagnosis Date  . A-fib (HCC)   . Anxiety   . Depression   . Schizotaxia     Patient Active Problem List   Diagnosis Date Noted  . Schizoaffective disorder (HCC) 09/26/2016  . Cannabis use disorder, severe, dependence (HCC) 09/26/2016    Past Surgical History:  Procedure Laterality Date  . NO PAST SURGERIES       OB History   None      Home Medications    Prior to Admission medications   Medication Sig Start Date End Date Taking? Authorizing Provider  albuterol (PROVENTIL HFA;VENTOLIN HFA) 108 (90 Base) MCG/ACT inhaler Inhale 1-2 puffs into the lungs every 6 (six) hours as needed for wheezing or shortness of breath. 06/19/18   Elson Areas, PA-C  ibuprofen (ADVIL,MOTRIN) 200 MG tablet Take 200 mg by mouth every 6 (six) hours as needed for moderate pain.    [provider]  ibuprofen (ADVIL,MOTRIN) 600 MG tablet Take 1 tablet (600 mg total) by mouth every 6 (six) hours as needed. 06/19/18   Elson Areas, PA-C  Prenatal Vit-Fe Fumarate-FA (PRENATAL MULTIVITAMIN) TABS tablet Take 1 tablet by mouth every evening.    [provider]    Family History Family History    Problem Relation Age of Onset  . Hypertension Other     Social History Social History   Tobacco Use  . Smoking status: Current Some Day Smoker    Types: Cigars  . Smokeless tobacco: Never Used  Substance Use Topics  . Alcohol use: Yes  . Drug use: Yes    Types: Marijuana     Allergies   Amoxicillin   Review of Systems Review of Systems  All other systems reviewed and are negative.    Physical Exam Updated Vital Signs BP (!) 131/91   Pulse 87   Temp 98.3 F (36.8 C) (Oral)   Resp 17   LMP 06/02/2018   SpO2 98%   Physical Exam  Constitutional: She appears well-developed and well-nourished.  HENT:  Head: Normocephalic and atraumatic.  Right Ear: External ear normal.  Left Ear: External ear normal.  Nose: Nose normal.  Mouth/Throat: Uvula is midline, oropharynx is clear and moist and mucous membranes are normal. No tonsillar exudate.  Eyes: Pupils are equal, round, and reactive to light. Right eye exhibits no discharge. Left eye exhibits no discharge. No scleral icterus.  Neck: Trachea normal. Neck supple. No spinous process tenderness present. No neck rigidity. Normal range of motion present.  Cardiovascular: Normal rate, regular rhythm and intact distal pulses.  No murmur heard. Pulses:      Radial pulses are 2+ on the  right side, and 2+ on the left side.       Dorsalis pedis pulses are 2+ on the right side, and 2+ on the left side.       Posterior tibial pulses are 2+ on the right side, and 2+ on the left side.  Pulmonary/Chest: Effort normal and breath sounds normal. She exhibits no tenderness.  Abdominal: Soft. Bowel sounds are normal. There is no tenderness. There is no rigidity, no rebound, no guarding and no CVA tenderness.  Musculoskeletal: She exhibits no edema.  Lymphadenopathy:    She has no cervical adenopathy.  Neurological: She is alert.  Speech clear. Follows commands. No facial droop. PERRLA. EOM grossly intact. CN III-XII grossly intact.  Grossly moves all extremities 4 without ataxia.   Skin: Skin is warm and dry. No rash noted. She is not diaphoretic.  Psychiatric: She has a normal mood and affect.  Nursing note and vitals reviewed.    ED Treatments / Results  Labs (all labs ordered are listed, but only abnormal results are displayed) Labs Reviewed - No data to display  EKG None  Radiology No results found.  Procedures Procedures (including critical care time)  Medications Ordered in ED Medications - No data to display   Initial Impression / Assessment and Plan / ED Course  I have reviewed the triage vital signs and the nursing notes.  Pertinent labs & imaging results that were available during my care of the patient were reviewed by me and considered in my medical decision making (see chart for details).     27 y.o. female presenting for no complaint. She reports she as cold outside and was seeking warmth and is asking for food. She reported to triage that she had abdominal pain and vaginal bleeding. She notes she had abdominal pain yesterday but does not have any currently.  She notes that her vaginal bleeding is from her menses.  Patient had a reassuring work-up yesterday.  This was reviewed.  Patient had a negative pregnancy test yesterday.  Vital signs are reassuring on presentation.  Exam is reassuring as above.  No work-up indicated at this time.  Will discharge home.  Recommended PCP follow-up.  Return precautions discussed.  Final Clinical Impressions(s) / ED Diagnoses   Final diagnoses:  Abdominal pain, unspecified abdominal location    ED Discharge Orders    None       Princella PellegriniMaczis, Zanylah Hardie M, PA-C 07/01/18 0405    Molpus, Jonny RuizJohn, MD 07/01/18 [phone]

## 2018-07-01 NOTE — Discharge Instructions (Addendum)
Follow up with your pcp. If your abdominal pain develops in the right lower portion of your abdomen, the right upper portion of your abdomen, you have fever, vomiting or if you develop worsening or new concerning symptoms you can return to the emergency department for re-evaluation.

## 2018-07-02 NOTE — MAU Note (Signed)
Patient wishes to sign out AMA.  Risks associated without being medically cleared by the provider discussed with patient.  States she still wants to leave.  Patient ambulatory and in no obvious distress upon leaving.

## 2018-07-03 ENCOUNTER — Encounter (HOSPITAL_COMMUNITY): Payer: Self-pay | Admitting: Obstetrics and Gynecology

## 2018-07-03 ENCOUNTER — Emergency Department (HOSPITAL_COMMUNITY)
Admission: EM | Admit: 2018-07-03 | Discharge: 2018-07-03 | Disposition: A | Discharge status: Home / Self Care | Payer: Medicaid Other | Attending: Emergency Medicine | Admitting: Emergency Medicine

## 2018-07-03 ENCOUNTER — Other Ambulatory Visit: Payer: Self-pay

## 2018-07-03 DIAGNOSIS — F259 Schizoaffective disorder, unspecified: Secondary | ICD-10-CM | POA: Insufficient documentation

## 2018-07-03 DIAGNOSIS — F1290 Cannabis use, unspecified, uncomplicated: Secondary | ICD-10-CM | POA: Insufficient documentation

## 2018-07-03 DIAGNOSIS — F17290 Nicotine dependence, other tobacco product, uncomplicated: Secondary | ICD-10-CM | POA: Insufficient documentation

## 2018-07-03 DIAGNOSIS — Z590 Homelessness: Secondary | ICD-10-CM | POA: Insufficient documentation

## 2018-07-03 DIAGNOSIS — R109 Unspecified abdominal pain: Secondary | ICD-10-CM

## 2018-07-03 LAB — URINALYSIS, ROUTINE W REFLEX MICROSCOPIC
BILIRUBIN URINE: NEGATIVE
Glucose, UA: NEGATIVE mg/dL
KETONES UR: 5 mg/dL — AB
Leukocytes, UA: NEGATIVE
Nitrite: NEGATIVE
PROTEIN: NEGATIVE mg/dL
Specific Gravity, Urine: 1.025 (ref 1.005–1.030)
pH: 5 (ref 5.0–8.0)

## 2018-07-03 LAB — COMPREHENSIVE METABOLIC PANEL
ALK PHOS: 58 U/L (ref 38–126)
ALT: 18 U/L (ref 0–44)
AST: 18 U/L (ref 15–41)
Albumin: 4.1 g/dL (ref 3.5–5.0)
Anion gap: 8 (ref 5–15)
BILIRUBIN TOTAL: 0.3 mg/dL (ref 0.3–1.2)
BUN: 7 mg/dL (ref 6–20)
CALCIUM: 8.7 mg/dL — AB (ref 8.9–10.3)
CO2: 24 mmol/L (ref 22–32)
CREATININE: 0.7 mg/dL (ref 0.44–1.00)
Chloride: 108 mmol/L (ref 98–111)
GFR calc Af Amer: >60 mL/min (ref >60)
Glucose, Bld: 106 mg/dL — ABNORMAL HIGH (ref 70–99)
Potassium: 3.9 mmol/L (ref 3.5–5.1)
Sodium: 140 mmol/L (ref 135–145)
Total Protein: 7.1 g/dL (ref 6.5–8.1)

## 2018-07-03 LAB — CBC WITH DIFFERENTIAL/PLATELET
ABS IMMATURE GRANULOCYTES: 0.02 10*3/uL (ref 0.00–0.07)
BASOS PCT: 1 %
Basophils Absolute: 0 10*3/uL (ref 0.0–0.1)
Eosinophils Absolute: 0.1 10*3/uL (ref 0.0–0.5)
Eosinophils Relative: 1 %
HEMATOCRIT: 39.8 % (ref 36.0–46.0)
HEMOGLOBIN: 13.6 g/dL (ref 12.0–15.0)
Immature Granulocytes: 0 %
LYMPHS ABS: 2.2 10*3/uL (ref 0.7–4.0)
Lymphocytes Relative: 25 %
MCH: 28.3 pg (ref 26.0–34.0)
MCHC: 34.2 g/dL (ref 30.0–36.0)
MCV: 82.7 fL (ref 80.0–100.0)
MONO ABS: 0.5 10*3/uL (ref 0.1–1.0)
MONOS PCT: 5 %
NEUTROS ABS: 6.1 10*3/uL (ref 1.7–7.7)
Neutrophils Relative %: 68 %
PLATELETS: 344 10*3/uL (ref 150–400)
RBC: 4.81 MIL/uL (ref 3.87–5.11)
RDW: 13.4 % (ref 11.5–15.5)
WBC: 8.9 10*3/uL (ref 4.0–10.5)
nRBC: 0 % (ref 0.0–0.2)

## 2018-07-03 LAB — LIPASE, BLOOD: Lipase: 72 U/L — ABNORMAL HIGH (ref 11–51)

## 2018-07-03 LAB — PREGNANCY, URINE: Preg Test, Ur: NEGATIVE

## 2018-07-03 NOTE — ED Notes (Signed)
Pt has part of her bags and walking down hallway. NT asked patient where she is going, pt states that "nned to go feed the baby and wanting to leave to go eat".  Informed patient that she needs to wait for eating until after the EDP comes and assesses her. Pt got graham crackers out and eating them.

## 2018-07-03 NOTE — ED Triage Notes (Signed)
Pt reports she is here for abdominal pain. Pt reports right upper and right lower pain.

## 2018-07-03 NOTE — ED Provider Notes (Signed)
[facility]-EMERGENCY DEPT Provider Note   CSN: [id number] Arrival date & time: 07/03/18  1238     History   Chief Complaint Chief Complaint  Patient presents with  . Abdominal Pain    HPI Kelly Crosby is a 27 y.o. female with a past medical history of schizoaffective disorder and frequent emergency department visits.  The patient states that she came today because it was raining and she needed to get out of the cold and that she had somewhere to be.  She also states that she was having some abdominal discomfort and a mild headache.  The patient is currently eating hot Cheeto fries.  She states that she is currently feeling better and would like to leave.  The patient was seen pushing a bariatric wheelchair with multiple bags of her belongings.  She has a history of homelessness.  HPI  Past Medical History:  Diagnosis Date  . A-fib (HCC)   . Anxiety   . Depression   . Schizotaxia     Patient Active Problem List   Diagnosis Date Noted  . Schizoaffective disorder (HCC) 09/26/2016  . Cannabis use disorder, severe, dependence (HCC) 09/26/2016    Past Surgical History:  Procedure Laterality Date  . NO PAST SURGERIES       OB History   None      Home Medications    Prior to Admission medications   Medication Sig Start Date End Date Taking? Authorizing Provider  albuterol (PROVENTIL HFA;VENTOLIN HFA) 108 (90 Base) MCG/ACT inhaler Inhale 1-2 puffs into the lungs every 6 (six) hours as needed for wheezing or shortness of breath. 06/19/18   Elson Areas, PA-C  ibuprofen (ADVIL,MOTRIN) 200 MG tablet Take 200 mg by mouth every 6 (six) hours as needed for moderate pain.    [provider]  ibuprofen (ADVIL,MOTRIN) 600 MG tablet Take 1 tablet (600 mg total) by mouth every 6 (six) hours as needed. 06/19/18   Elson Areas, PA-C  Prenatal Vit-Fe Fumarate-FA (PRENATAL MULTIVITAMIN) TABS tablet Take 1 tablet by mouth every evening.    [provider]    Family History Family History  Problem Relation Age of Onset  . Hypertension Other     Social History Social History   Tobacco Use  . Smoking status: Current Some Day Smoker    Types: Cigars  . Smokeless tobacco: Never Used  Substance Use Topics  . Alcohol use: Yes  . Drug use: Yes    Types: Marijuana     Allergies   Amoxicillin   Review of Systems Review of Systems  Unable to perform ROS: Psychiatric disorder     Physical Exam Updated Vital Signs BP 118/80   Pulse 84   Temp 98.7 F (37.1 C)   Resp 18   SpO2 99%   Physical Exam  Constitutional: She is oriented to person, place, and time. She appears well-developed and well-nourished. No distress.  HENT:  Head: Normocephalic and atraumatic.  Eyes: Conjunctivae are normal. No scleral icterus.  Neck: Normal range of motion.  Cardiovascular: Normal rate, regular rhythm and normal heart sounds. Exam reveals no gallop and no friction rub.  No murmur heard. Pulmonary/Chest: Effort normal and breath sounds normal. No respiratory distress.  Abdominal: Soft. Bowel sounds are normal. She exhibits no distension and no mass. There is no tenderness. There is no guarding.  Neurological: She is alert and oriented to person, place, and time.  Skin: Skin is warm and dry. She is  not diaphoretic.  Psychiatric:  Bizarre affect, behavior abnormal  Nursing note and vitals reviewed.    ED Treatments / Results  Labs (all labs ordered are listed, but only abnormal results are displayed) Labs Reviewed  COMPREHENSIVE METABOLIC PANEL - Abnormal; Notable for the following components:      Result Value   Glucose, Bld 106 (*)    Calcium 8.7 (*)    All other components within normal limits  LIPASE, BLOOD - Abnormal; Notable for the following components:   Lipase 72 (*)    All other components within normal limits  URINALYSIS, ROUTINE W REFLEX MICROSCOPIC - Abnormal; Notable for the following components:    APPearance HAZY (*)    Hgb urine dipstick SMALL (*)    Ketones, ur 5 (*)    Bacteria, UA RARE (*)    All other components within normal limits  CBC WITH DIFFERENTIAL/PLATELET  PREGNANCY, URINE    EKG None  Radiology No results found.  Procedures Procedures (including critical care time)  Medications Ordered in ED Medications - No data to display   Initial Impression / Assessment and Plan / ED Course  I have reviewed the triage vital signs and the nursing notes.  Pertinent labs & imaging results that were available during my care of the patient were reviewed by me and considered in my medical decision making (see chart for details).     27 year old female with history of schizoaffective disorder.  She does not seem to have any emergent complaint other than she wanted to get out of the cold in the rain.  She has a benign abdominal exam and normal labs.  She is asking to leave.  She appears appropriate for discharge at this time.   Final Clinical Impressions(s) / ED Diagnoses   Final diagnoses:  Abdominal discomfort    ED Discharge Orders    None       Arthor CaptainHarris, Omolola Mittman, PA-C 07/03/18 1425    Arby BarrettePfeiffer, Marcy, MD 07/04/18 [phone]

## 2018-07-03 NOTE — ED Notes (Addendum)
While collecting lab work, patient request to be discharged with albuterol inhaler and ibuprofen to take before she leaves, as she has lunch appt at 230pm today that she needs to get too.  Made Dr Pfeiffer awareDonnald Garre, who is over [place], since can't get in touch with her to make her aware.

## 2018-07-03 NOTE — Discharge Instructions (Addendum)

## 2018-07-14 DIAGNOSIS — F609 Personality disorder, unspecified: Secondary | ICD-10-CM | POA: Diagnosis present

## 2019-02-05 IMAGING — DX DG CHEST 2V
2 series · 2 of 2 positions shown · non-contrast
Comparison: Chest radiograph 04/17/2018

CLINICAL DATA: Patient with cough.

EXAM:
CHEST - 2 VIEW

[chest pa]
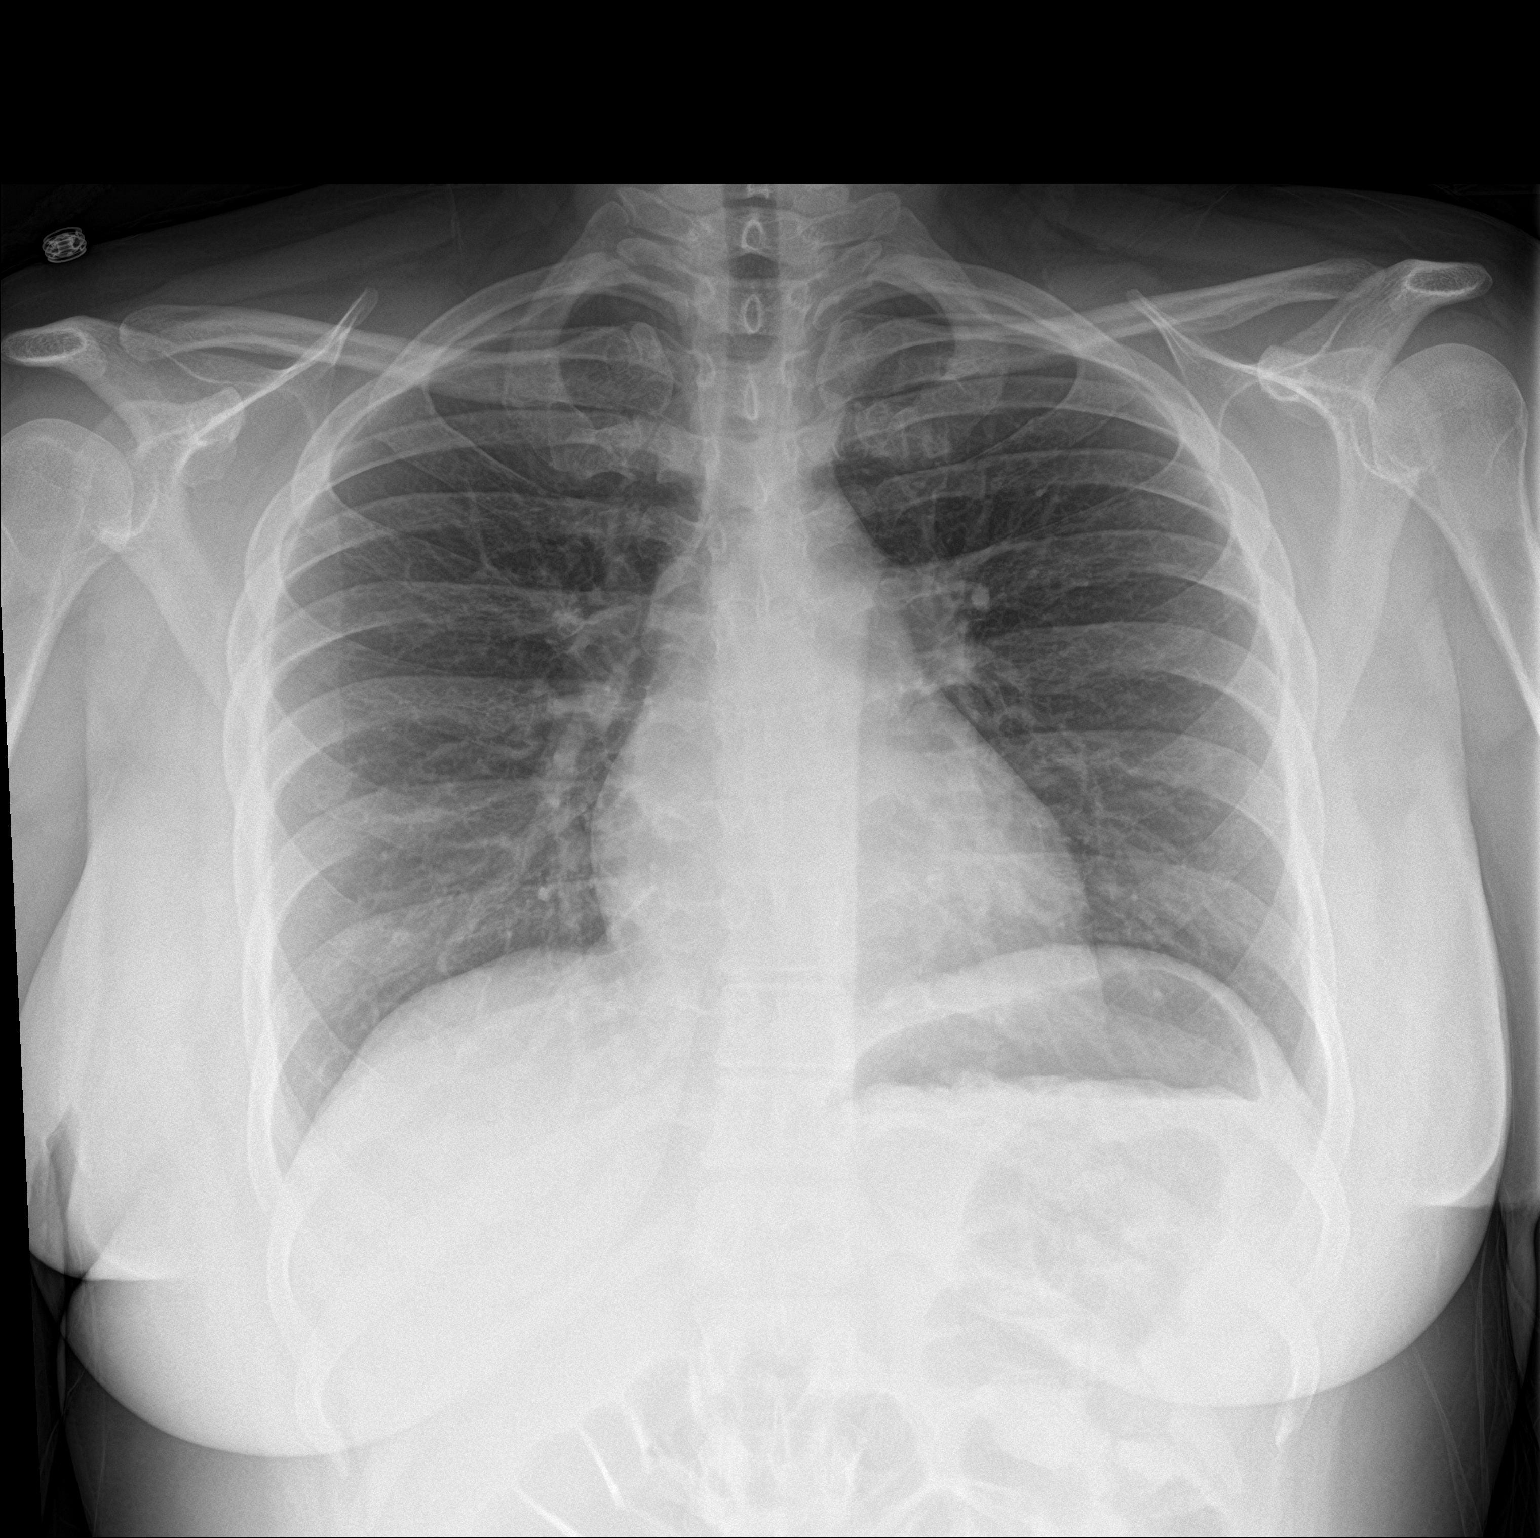

[chest lat]
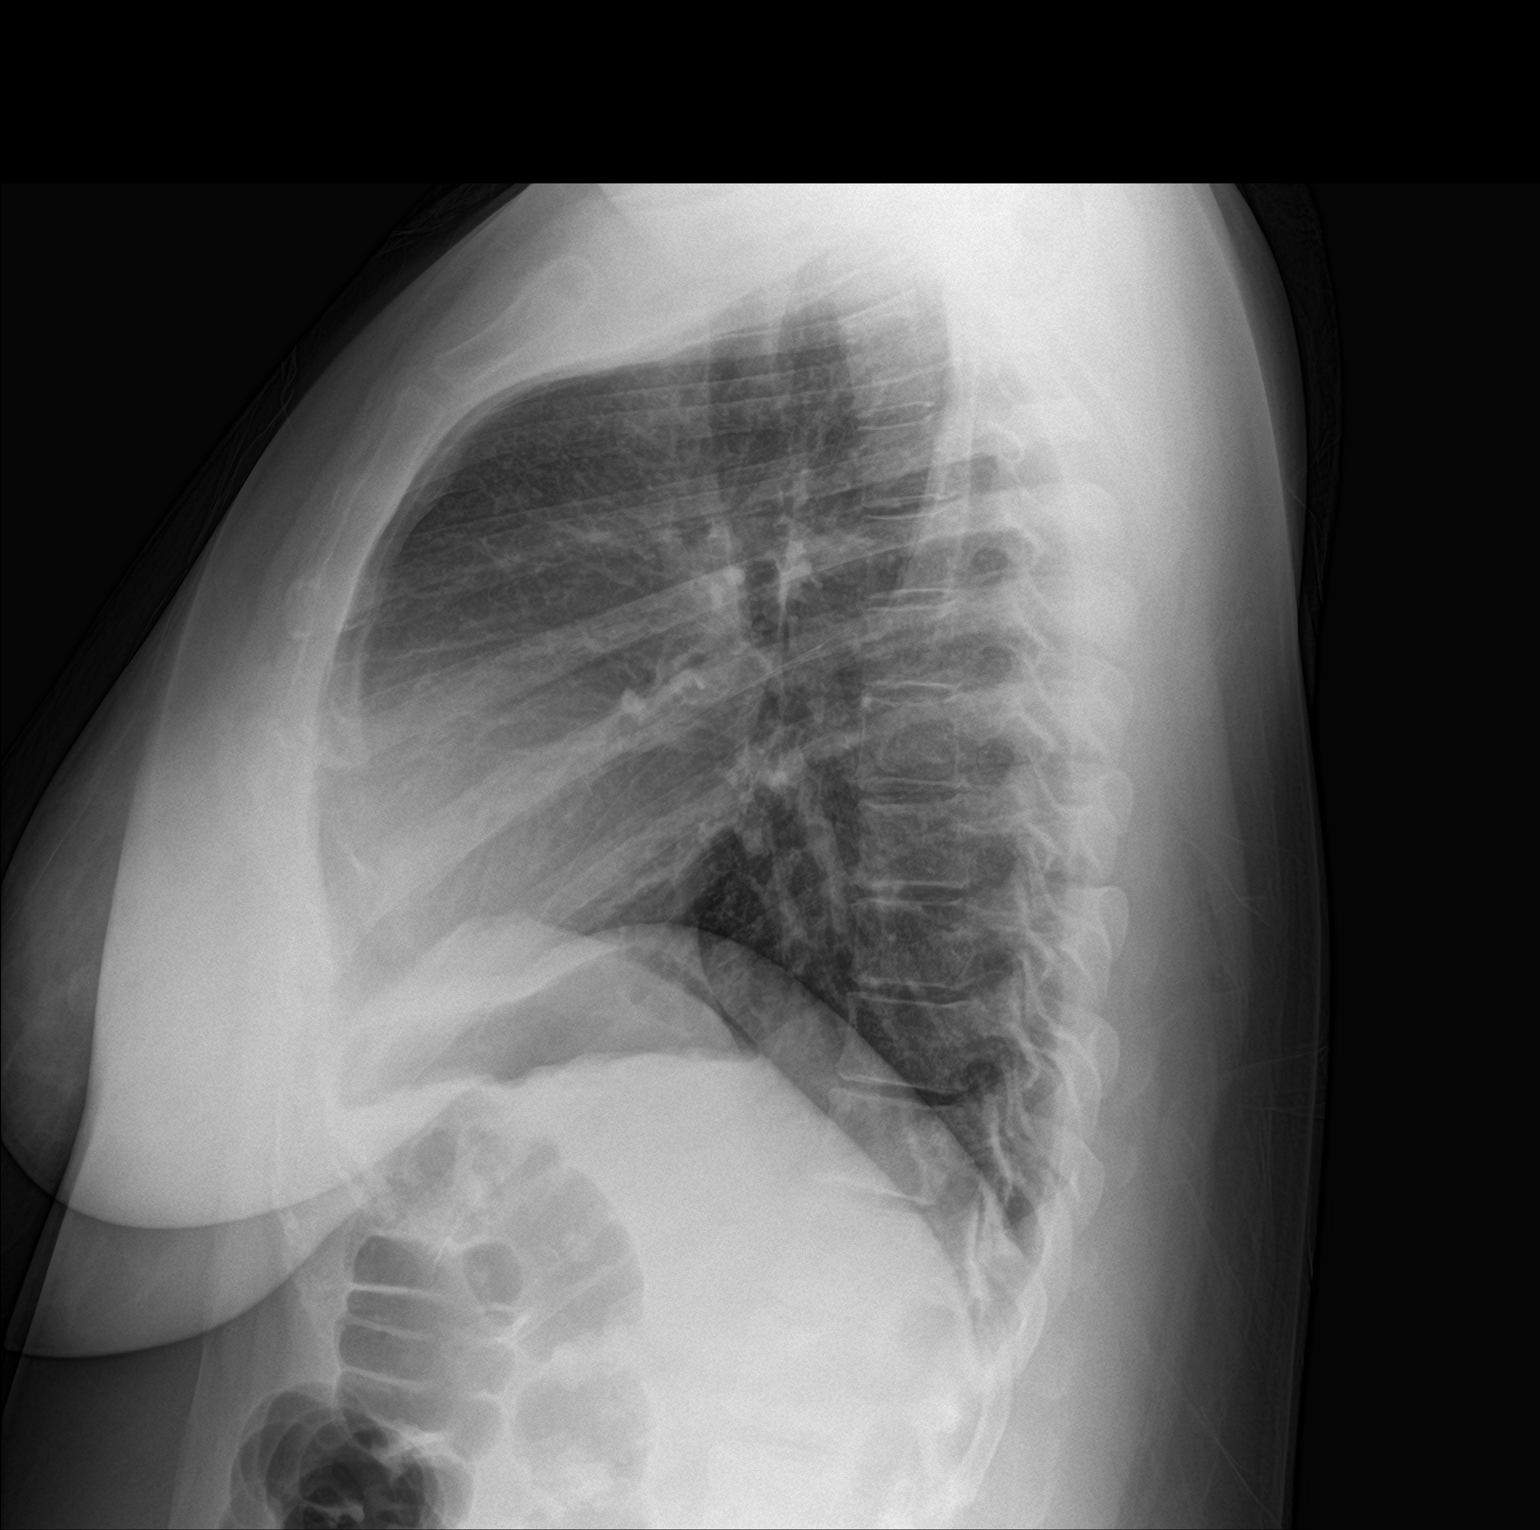

[2 of 2 positions shown; findings below may reference images not displayed]

FINDINGS: Normal cardiac and mediastinal contours. No consolidative pulmonary
opacities. No pleural effusion or pneumothorax. Unremarkable osseous
structures
IMPRESSION: No acute cardiopulmonary process.

## 2019-08-30 LAB — HM PAP SMEAR

## 2020-03-05 ENCOUNTER — Emergency Department (HOSPITAL_COMMUNITY)
Admission: EM | Admit: 2020-03-05 | Discharge: 2020-03-05 | Disposition: A | Discharge status: Home / Self Care | Payer: Medicaid Other | Attending: Emergency Medicine | Admitting: Emergency Medicine

## 2020-03-05 DIAGNOSIS — Z5321 Procedure and treatment not carried out due to patient leaving prior to being seen by health care provider: Secondary | ICD-10-CM | POA: Insufficient documentation

## 2020-03-05 DIAGNOSIS — S0512XA Contusion of eyeball and orbital tissues, left eye, initial encounter: Secondary | ICD-10-CM | POA: Insufficient documentation

## 2020-03-05 DIAGNOSIS — Y939 Activity, unspecified: Secondary | ICD-10-CM | POA: Insufficient documentation

## 2020-03-05 DIAGNOSIS — Y999 Unspecified external cause status: Secondary | ICD-10-CM | POA: Insufficient documentation

## 2020-03-05 DIAGNOSIS — Y929 Unspecified place or not applicable: Secondary | ICD-10-CM | POA: Insufficient documentation

## 2020-03-05 DIAGNOSIS — M79674 Pain in right toe(s): Secondary | ICD-10-CM | POA: Insufficient documentation

## 2020-03-05 DIAGNOSIS — S70212A Abrasion, left hip, initial encounter: Secondary | ICD-10-CM | POA: Insufficient documentation

## 2020-03-05 DIAGNOSIS — S70312A Abrasion, left thigh, initial encounter: Secondary | ICD-10-CM | POA: Insufficient documentation
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
## 2020-03-05 NOTE — ED Notes (Signed)
Pt left due to waiting room wait time. Pt will be moved OTF.

## 2020-05-15 ENCOUNTER — Ambulatory Visit: Payer: Self-pay | Admitting: Podiatry

## 2020-06-26 ENCOUNTER — Other Ambulatory Visit: Payer: Self-pay

## 2020-06-26 ENCOUNTER — Emergency Department (HOSPITAL_COMMUNITY)
Admission: EM | Admit: 2020-06-26 | Discharge: 2020-06-26 | Disposition: A | Discharge status: Home / Self Care | Payer: Commercial Managed Care - PPO | Attending: Emergency Medicine | Admitting: Emergency Medicine

## 2020-06-26 ENCOUNTER — Encounter (HOSPITAL_COMMUNITY): Payer: Self-pay | Admitting: Emergency Medicine

## 2020-06-26 ENCOUNTER — Emergency Department (HOSPITAL_COMMUNITY): Payer: Commercial Managed Care - PPO

## 2020-06-26 DIAGNOSIS — R42 Dizziness and giddiness: Secondary | ICD-10-CM | POA: Insufficient documentation

## 2020-06-26 DIAGNOSIS — R112 Nausea with vomiting, unspecified: Secondary | ICD-10-CM | POA: Diagnosis not present

## 2020-06-26 DIAGNOSIS — R519 Headache, unspecified: Secondary | ICD-10-CM | POA: Diagnosis not present

## 2020-06-26 DIAGNOSIS — F17290 Nicotine dependence, other tobacco product, uncomplicated: Secondary | ICD-10-CM | POA: Insufficient documentation

## 2020-06-26 LAB — CBC WITH DIFFERENTIAL/PLATELET
Abs Immature Granulocytes: 0.02 10*3/uL (ref 0.00–0.07)
Basophils Absolute: 0.1 10*3/uL (ref 0.0–0.1)
Basophils Relative: 1 %
Eosinophils Absolute: 0 10*3/uL (ref 0.0–0.5)
Eosinophils Relative: 0 %
HCT: 42.2 % (ref 36.0–46.0)
Hemoglobin: 14.7 g/dL (ref 12.0–15.0)
Immature Granulocytes: 0 %
Lymphocytes Relative: 22 %
Lymphs Abs: 2.2 10*3/uL (ref 0.7–4.0)
MCH: 29.2 pg (ref 26.0–34.0)
MCHC: 34.8 g/dL (ref 30.0–36.0)
MCV: 83.9 fL (ref 80.0–100.0)
Monocytes Absolute: 0.5 10*3/uL (ref 0.1–1.0)
Monocytes Relative: 5 %
Neutro Abs: 7.4 10*3/uL (ref 1.7–7.7)
Neutrophils Relative %: 72 %
Platelets: 348 10*3/uL (ref 150–400)
RBC: 5.03 MIL/uL (ref 3.87–5.11)
RDW: 13 % (ref 11.5–15.5)
WBC: 10.2 10*3/uL (ref 4.0–10.5)
nRBC: 0 % (ref 0.0–0.2)

## 2020-06-26 LAB — BASIC METABOLIC PANEL
Anion gap: 7 (ref 5–15)
BUN: 8 mg/dL (ref 6–20)
CO2: 27 mmol/L (ref 22–32)
Calcium: 9.3 mg/dL (ref 8.9–10.3)
Chloride: 103 mmol/L (ref 98–111)
Creatinine, Ser: 0.96 mg/dL (ref 0.44–1.00)
GFR, Estimated: >60 mL/min (ref >60)
Glucose, Bld: 94 mg/dL (ref 70–99)
Potassium: 4.2 mmol/L (ref 3.5–5.1)
Sodium: 137 mmol/L (ref 135–145)

## 2020-06-26 LAB — I-STAT BETA HCG BLOOD, ED (MC, WL, AP ONLY): I-stat hCG, quantitative: <5 m[IU]/mL (ref <5)

## 2020-06-26 MED ORDER — SODIUM CHLORIDE 0.9 % IV BOLUS
1000.0000 mL | Freq: Once | INTRAVENOUS | Status: AC
  Administered 2020-06-26: 1000 mL via INTRAVENOUS

## 2020-06-26 MED ORDER — METOCLOPRAMIDE HCL 5 MG/ML IJ SOLN
10.0000 mg | Freq: Once | INTRAMUSCULAR | Status: AC
  Administered 2020-06-26: 10 mg via INTRAVENOUS
  Filled 2020-06-26: qty 2

## 2020-06-26 MED ORDER — KETOROLAC TROMETHAMINE 30 MG/ML IJ SOLN
30.0000 mg | Freq: Once | INTRAMUSCULAR | Status: AC
  Administered 2020-06-26: 30 mg via INTRAVENOUS
  Filled 2020-06-26: qty 1

## 2020-06-26 NOTE — ED Triage Notes (Signed)
Patient complains of headaches and nausea which began 1-2 hours ago. Pain is more intense on the right side.    EMS vitals: 134/79 BP 87 HR 92 CBG 97% SPO2 16 Resp Rate

## 2020-06-26 NOTE — ED Provider Notes (Signed)
[facility] EMERGENCY DEPARTMENT Provider Note  CSN: [id number] Arrival date & time: 06/26/20 1626    History Chief Complaint  Patient presents with  . Headache  . Emesis  . Nausea    HPI  Kelly Crosby is a 29 y.o. female with no prior history of headaches reports she was at work this afternoon, not doing anything strenuous when she had gradual onset of severe R sided headache, described as pressure, associated with nausea, vomiting and dizziness. She has not prior history of similar, states this is the worst headache she has ever had but did not have a thunderclap-like onset. She denies any numbness or weakness in extremities. No change in medications recently, unsure if she is pregnant. She states all she's had to eat today is some frozen pineapple, potato chips with water and gingerale.    Past Medical History:  Diagnosis Date  . A-fib (HCC)   . Anxiety   . Depression   . Schizotaxia     Past Surgical History:  Procedure Laterality Date  . NO PAST SURGERIES      Family History  Problem Relation Age of Onset  . Hypertension Other     Social History   Tobacco Use  . Smoking status: Current Some Day Smoker    Types: Cigars  . Smokeless tobacco: Never Used  Vaping Use  . Vaping Use: Former  Substance Use Topics  . Alcohol use: Yes  . Drug use: Yes    Types: Marijuana     Home Medications Prior to Admission medications   Medication Sig Start Date End Date Taking? Authorizing Provider  albuterol (PROVENTIL HFA;VENTOLIN HFA) 108 (90 Base) MCG/ACT inhaler Inhale 1-2 puffs into the lungs every 6 (six) hours as needed for wheezing or shortness of breath. 06/19/18   Elson Areas, PA-C  ibuprofen (ADVIL,MOTRIN) 200 MG tablet Take 200 mg by mouth every 6 (six) hours as needed for moderate pain.    [provider]  ibuprofen (ADVIL,MOTRIN) 600 MG tablet Take 1 tablet (600 mg total) by mouth every 6 (six) hours as needed. 06/19/18   Elson Areas, PA-C    Prenatal Vit-Fe Fumarate-FA (PRENATAL MULTIVITAMIN) TABS tablet Take 1 tablet by mouth every evening.    [provider]     Allergies    Amoxicillin   Review of Systems   Review of Systems A comprehensive review of systems was completed and negative except as noted in HPI.    Physical Exam BP 137/77   Pulse 75   Temp 98.4 F (36.9 C) (Oral)   Resp 16   Ht 5\' 6"  (1.676 m)   Wt 113.4 kg   SpO2 99%   BMI 40.35 kg/m   Physical Exam Vitals and nursing note reviewed.  Constitutional:      Appearance: Normal appearance.  HENT:     Head: Normocephalic and atraumatic.     Nose: Nose normal.     Mouth/Throat:     Mouth: Mucous membranes are moist.  Eyes:     Extraocular Movements: Extraocular movements intact.     Conjunctiva/sclera: Conjunctivae normal.  Cardiovascular:     Rate and Rhythm: Normal rate.  Pulmonary:     Effort: Pulmonary effort is normal.     Breath sounds: Normal breath sounds.  Abdominal:     General: Abdomen is flat.     Palpations: Abdomen is soft.     Tenderness: There is no abdominal tenderness.  Musculoskeletal:  General: No swelling. Normal range of motion.     Cervical back: Neck supple.  Skin:    General: Skin is warm and dry.  Neurological:     General: No focal deficit present.     Mental Status: She is alert and oriented to person, place, and time.     Cranial Nerves: No cranial nerve deficit.     Sensory: No sensory deficit.     Motor: No weakness.     Coordination: Coordination normal.  Psychiatric:        Mood and Affect: Mood normal.      ED Results / Procedures / Treatments   Labs (all labs ordered are listed, but only abnormal results are displayed) Labs Reviewed  BASIC METABOLIC PANEL  CBC WITH DIFFERENTIAL/PLATELET  I-STAT BETA HCG BLOOD, ED ([facility], [facility], [facility] ONLY)    EKG None  Radiology CT Head Wo Contrast  Result Date: 06/26/2020 CLINICAL DATA:  Headache, intracranial hemorrhage suspected. EXAM:  CT HEAD WITHOUT CONTRAST TECHNIQUE: Contiguous axial images were obtained from the base of the skull through the vertex without intravenous contrast. COMPARISON:  Head CT 03/06/2020. FINDINGS: Brain: Cerebral volume is normal for age. There is no acute intracranial hemorrhage. No demarcated cortical infarct. No extra-axial fluid collection. No evidence of intracranial mass. No midline shift. Partially empty sella turcica. Vascular: No hyperdense vessel. Skull: Normal. Negative for fracture or focal lesion. Sinuses/Orbits: Visualized orbits show no acute finding. No significant paranasal sinus disease at the imaged levels. IMPRESSION: No evidence of acute intracranial abnormality. Partially empty sella turcica. This finding is very commonly incidental, but can be associated with idiopathic intracranial hypertension. Electronically Signed   By: Jackey Loge DO   On: 06/26/2020 18:17    Procedures Procedures  Medications Ordered in the ED Medications  sodium chloride 0.9 % bolus 1,000 mL (0 mLs Intravenous Stopped 06/26/20 1918)  ketorolac (TORADOL) 30 MG/ML injection 30 mg (30 mg Intravenous Given 06/26/20 1923)  metoCLOPramide (REGLAN) injection 10 mg (10 mg Intravenous Given 06/26/20 1923)     MDM Rules/Calculators/A&P MDM Patient with headache that sounds migrainous, but has never had similar before. Given prompt presentation to the ED and description of 'worst headache ever', will send for CT head to rule out SAH. Patient is otherwise well appearing. Labs and IVF ordered as well.  ED Course  I have reviewed the triage vital signs and the nursing notes.  Pertinent labs & imaging results that were available during my care of the patient were reviewed by me and considered in my medical decision making (see chart for details).  Clinical Course as of Jun 26 1932  Tue Jun 26, 2020  1841 CT report available in PACS but not yet crossed into Epic: IMPRESSION: No evidence of acute intracranial  abnormality.  Partially empty sella turcica. This finding is very commonly incidental, but can be associated with idiopathic intracranial hypertension. Will give a dose of Toradol/Reglan and reassess.    [CS]  1931 Patient reports she is feeling better, headache resolved and she is eager to go home. PCP follow up. RTED for any other concerns.    [CS]    Clinical Course User Index [CS] Pollyann Savoy, MD    Final Clinical Impression(s) / ED Diagnoses Final diagnoses:  Acute nonintractable headache, unspecified headache type    Rx / DC Orders ED Discharge Orders    None       Pollyann Savoy, MD 06/26/20 [phone]

## 2020-06-26 NOTE — ED Notes (Addendum)
Pt given a sandwich and juice

## 2020-06-26 NOTE — ED Notes (Signed)
Patient complains of a sudden severe headache. She complains of lightheadedness and 3 episodes of vomiting.

## 2020-07-23 ENCOUNTER — Emergency Department (HOSPITAL_COMMUNITY)
Admission: EM | Admit: 2020-07-23 | Discharge: 2020-07-23 | Discharge status: Against Medical Advice | Payer: Commercial Managed Care - PPO | Attending: Emergency Medicine | Admitting: Emergency Medicine

## 2020-07-23 ENCOUNTER — Encounter (HOSPITAL_COMMUNITY): Payer: Self-pay

## 2020-07-23 ENCOUNTER — Other Ambulatory Visit: Payer: Self-pay

## 2020-07-23 DIAGNOSIS — Z5321 Procedure and treatment not carried out due to patient leaving prior to being seen by health care provider: Secondary | ICD-10-CM | POA: Diagnosis not present

## 2020-07-23 DIAGNOSIS — R509 Fever, unspecified: Secondary | ICD-10-CM | POA: Diagnosis not present

## 2020-07-23 DIAGNOSIS — R2232 Localized swelling, mass and lump, left upper limb: Secondary | ICD-10-CM | POA: Diagnosis not present

## 2020-07-23 DIAGNOSIS — M791 Myalgia, unspecified site: Secondary | ICD-10-CM | POA: Insufficient documentation

## 2020-07-23 DIAGNOSIS — J029 Acute pharyngitis, unspecified: Secondary | ICD-10-CM | POA: Insufficient documentation

## 2020-07-23 LAB — GROUP A STREP BY PCR: Group A Strep by PCR: NOT DETECTED

## 2020-07-23 NOTE — ED Provider Notes (Cosign Needed)
Went to patient's room to evaluate her she is not presently room.  When I asked her staff states that she went to the bathroom a few months ago.  Bathrooms are empty nurse suspect that she left.  I did not assess patient at any time during the ED visit.   Solon Augusta [place], [place] 07/23/20 [phone]

## 2020-07-23 NOTE — ED Triage Notes (Signed)
Patient c/o sore throat and states she has white on her tongue, fever, generalized body aches, and chills since yesterday.  Patient also c/o "a knot on her left wrist." x 2 weeks. Patient states is does not bother her,but is a knot on her left wrist."

## 2020-07-23 NOTE — ED Notes (Signed)
PA and nurse went in room and room is empty with no patient belongings. Checked restroom.

## 2020-08-12 ENCOUNTER — Encounter (HOSPITAL_COMMUNITY): Payer: Self-pay | Admitting: Emergency Medicine

## 2020-08-12 ENCOUNTER — Other Ambulatory Visit: Payer: Self-pay

## 2020-08-12 ENCOUNTER — Emergency Department (HOSPITAL_COMMUNITY): Payer: Commercial Managed Care - PPO

## 2020-08-12 ENCOUNTER — Emergency Department (HOSPITAL_COMMUNITY)
Admission: EM | Admit: 2020-08-12 | Discharge: 2020-08-12 | Disposition: A | Discharge status: Home / Self Care | Payer: Commercial Managed Care - PPO | Attending: Emergency Medicine | Admitting: Emergency Medicine

## 2020-08-12 DIAGNOSIS — J069 Acute upper respiratory infection, unspecified: Secondary | ICD-10-CM | POA: Diagnosis not present

## 2020-08-12 DIAGNOSIS — F17290 Nicotine dependence, other tobacco product, uncomplicated: Secondary | ICD-10-CM | POA: Diagnosis not present

## 2020-08-12 DIAGNOSIS — Z20822 Contact with and (suspected) exposure to covid-19: Secondary | ICD-10-CM | POA: Diagnosis not present

## 2020-08-12 DIAGNOSIS — R059 Cough, unspecified: Secondary | ICD-10-CM | POA: Diagnosis present

## 2020-08-12 LAB — CBC
HCT: 41.8 % (ref 36.0–46.0)
Hemoglobin: 14.5 g/dL (ref 12.0–15.0)
MCH: 29 pg (ref 26.0–34.0)
MCHC: 34.7 g/dL (ref 30.0–36.0)
MCV: 83.6 fL (ref 80.0–100.0)
Platelets: 330 10*3/uL (ref 150–400)
RBC: 5 MIL/uL (ref 3.87–5.11)
RDW: 13.3 % (ref 11.5–15.5)
WBC: 10 10*3/uL (ref 4.0–10.5)
nRBC: 0 % (ref 0.0–0.2)

## 2020-08-12 LAB — BASIC METABOLIC PANEL
Anion gap: 8 (ref 5–15)
BUN: 10 mg/dL (ref 6–20)
CO2: 22 mmol/L (ref 22–32)
Calcium: 9 mg/dL (ref 8.9–10.3)
Chloride: 109 mmol/L (ref 98–111)
Creatinine, Ser: 0.85 mg/dL (ref 0.44–1.00)
GFR, Estimated: >60 mL/min (ref >60)
Glucose, Bld: 101 mg/dL — ABNORMAL HIGH (ref 70–99)
Potassium: 3.9 mmol/L (ref 3.5–5.1)
Sodium: 139 mmol/L (ref 135–145)

## 2020-08-12 LAB — RESP PANEL BY RT-PCR (FLU A&B, COVID) ARPGX2
Influenza A by PCR: NEGATIVE
Influenza B by PCR: NEGATIVE
SARS Coronavirus 2 by RT PCR: NEGATIVE

## 2020-08-12 MED ORDER — PREDNISONE 50 MG PO TABS: 50.0000 mg | ORAL_TABLET | Freq: Every day | ORAL | 0 refills | Status: DC

## 2020-08-12 MED ORDER — BENZONATATE 100 MG PO CAPS: 100.0000 mg | ORAL_CAPSULE | Freq: Three times a day (TID) | ORAL | 0 refills | Status: DC

## 2020-08-12 MED ORDER — ZYRTEC ALLERGY 10 MG PO CAPS: ORAL_CAPSULE | ORAL | 0 refills | Status: DC

## 2020-08-12 MED ORDER — FLUTICASONE PROPIONATE 50 MCG/ACT NA SUSP: 2.0000 | Freq: Every day | NASAL | 0 refills | Status: DC

## 2020-08-12 NOTE — Discharge Instructions (Signed)
Take medications as prescribed Your Covid test result will be on MyChart.  Return for new or worsening symptoms.  Follow up with primary care provider in 1 week if symptoms are unresolved

## 2020-08-12 NOTE — ED Provider Notes (Signed)
[facility]-EMERGENCY DEPT Provider Note   CSN: [id number] Arrival date & time: 08/12/20  7253     History Chief Complaint  Patient presents with  . Cough    Kelly Crosby is a 30 y.o. female with past medical history significant for schizoaffective disorder who presents for evaluation of cough and chest congestion.  Symptoms initially began a week and a half ago.  Was seen by telehealth visit by her PCP who prescribed Mucinex and albuterol.  Patient states that is helped "some."  She feels like she continues to have a worsening cough.  Initially productive and is now dry.  She i states she intermittently feels like she has a wheeze when she breathes.  Admits to clear rhinorrhea, nasal congestion, postnasal drip.  Has had some myalgias, chills worse not taken her temperature.  She is vaccinated against COVID.  She did have an exposure at Christmas with known positive family members.  She denies fever, nausea, vomiting, neck pain, neck stiffness, sore throat, chest pain, shortness of breath, hemoptysis abdominal pain, diarrhea, dysuria, unilateral leg swelling, redness or warmth.  Denies additional aggravating or alleviating factors.  History obtained from patient and past medical records. No  interpreter used  HPI     Past Medical History:  Diagnosis Date  . A-fib (HCC)   . Anxiety   . Depression   . Schizotaxia     Patient Active Problem List   Diagnosis Date Noted  . Schizoaffective disorder (HCC) 09/26/2016  . Cannabis use disorder, severe, dependence (HCC) 09/26/2016    Past Surgical History:  Procedure Laterality Date  . NO PAST SURGERIES       OB History   No obstetric history on file.     Family History  Problem Relation Age of Onset  . Hypertension Other   . Hypertension Mother   . Post-traumatic stress disorder Father     Social History   Tobacco Use  . Smoking status: Current Some Day Smoker    Types: Cigars  . Smokeless tobacco:  Never Used  Vaping Use  . Vaping Use: Former  Substance Use Topics  . Alcohol use: Yes  . Drug use: Yes    Types: Marijuana    Home Medications Prior to Admission medications   Medication Sig Start Date End Date Taking? Authorizing Provider  benzonatate (TESSALON) 100 MG capsule Take 1 capsule (100 mg total) by mouth every 8 (eight) hours. 08/12/20  Yes Elihu Milstein A, PA-C  Cetirizine HCl (ZYRTEC ALLERGY) 10 MG CAPS Take 1 tablet daily 08/12/20  Yes Garnet Chatmon A, PA-C  fluticasone (FLONASE) 50 MCG/ACT nasal spray Place 2 sprays into both nostrils daily. 08/12/20  Yes Basilia Stuckert A, PA-C  predniSONE (DELTASONE) 50 MG tablet Take 1 tablet (50 mg total) by mouth daily. 08/12/20  Yes Jermey Closs A, PA-C  albuterol (PROVENTIL HFA;VENTOLIN HFA) 108 (90 Base) MCG/ACT inhaler Inhale 1-2 puffs into the lungs every 6 (six) hours as needed for wheezing or shortness of breath. 06/19/18   Elson Areas, PA-C  ibuprofen (ADVIL,MOTRIN) 200 MG tablet Take 200 mg by mouth every 6 (six) hours as needed for moderate pain.    [provider]  ibuprofen (ADVIL,MOTRIN) 600 MG tablet Take 1 tablet (600 mg total) by mouth every 6 (six) hours as needed. 06/19/18   Elson Areas, PA-C  Prenatal Vit-Fe Fumarate-FA (PRENATAL MULTIVITAMIN) TABS tablet Take 1 tablet by mouth every evening.    [provider]  Allergies    Amoxicillin  Review of Systems   Review of Systems  Constitutional: Positive for appetite change, chills and fatigue.  HENT: Positive for congestion, postnasal drip and rhinorrhea. Negative for sinus pressure, sinus pain, sneezing, sore throat, trouble swallowing and voice change.   Respiratory: Positive for cough and wheezing. Negative for apnea, choking, chest tightness, shortness of breath and stridor.   Cardiovascular: Negative.   Gastrointestinal: Negative.   Genitourinary: Negative.   Musculoskeletal: Negative.   Skin: Negative.   Neurological:  Negative.   All other systems reviewed and are negative.   Physical Exam Updated Vital Signs BP (!) 127/98 (BP Location: Left Arm)   Pulse 72   Temp 99 F (37.2 C) (Oral)   Resp 19   Ht 5\' 6"  (1.676 m)   Wt 114 kg   LMP 07/22/2020   SpO2 98%   BMI 40.56 kg/m   Physical Exam Vitals and nursing note reviewed.  Constitutional:      General: She is not in acute distress.    Appearance: She is not ill-appearing, toxic-appearing or diaphoretic.  HENT:     Head: Normocephalic and atraumatic.     Jaw: There is normal jaw occlusion.     Right Ear: Tympanic membrane, ear canal and external ear normal. There is no impacted cerumen. No hemotympanum. Tympanic membrane is not injected, scarred, perforated, erythematous, retracted or bulging.     Left Ear: Tympanic membrane, ear canal and external ear normal. There is no impacted cerumen. No hemotympanum. Tympanic membrane is not injected, scarred, perforated, erythematous, retracted or bulging.     Ears:     Comments: No Mastoid tenderness.    Nose:     Comments: Clear rhinorrhea and congestion to bilateral nares.  No sinus tenderness.    Mouth/Throat:     Comments: Posterior oropharynx clear.  Mucous membranes moist.  Tonsils without erythema or exudate.  Uvula midline without deviation.  No evidence of PTA or RPA.  No drooling, dysphasia or trismus.  Phonation normal. Neck:     Trachea: Trachea and phonation normal.     Comments: No Neck stiffness or neck rigidity.  No meningismus.  No cervical lymphadenopathy. Cardiovascular:     Comments: No murmurs rubs or gallops. Pulmonary:     Comments: Clear to auscultation bilaterally without wheeze, rhonchi or rales.  No accessory muscle usage.  Able speak in full sentences. Abdominal:     Comments: Soft, nontender without rebound or guarding.  No CVA tenderness.  Musculoskeletal:     Comments: Moves all 4 extremities without difficulty.  Lower extremities without edema, erythema or warmth.   Skin:    Comments: Brisk capillary refill.  No rashes or lesions.  Neurological:     Mental Status: She is alert.     Comments: Ambulatory in department without difficulty.  Cranial nerves II through XII grossly intact.  No facial droop.  No aphasia.     ED Results / Procedures / Treatments   Labs (all labs ordered are listed, but only abnormal results are displayed) Labs Reviewed  BASIC METABOLIC PANEL - Abnormal; Notable for the following components:      Result Value   Glucose, Bld 101 (*)    All other components within normal limits  RESP PANEL BY RT-PCR (FLU A&B, COVID) [id number]  CBC    EKG None  Radiology DG Chest 2 View  Result Date: 08/12/2020 CLINICAL DATA:  Shortness of breath, productive cough. EXAM: CHEST - 2 VIEW COMPARISON:  May 23, 2018. FINDINGS: The heart size and mediastinal contours are within normal limits. Both lungs are clear. No pneumothorax or pleural effusion is noted. The visualized skeletal structures are unremarkable. IMPRESSION: No active cardiopulmonary disease. Electronically Signed   By: Lupita Raider M.D.   On: 08/12/2020 10:05    Procedures Procedures (including critical care time)  Medications Ordered in ED Medications - No data to display  ED Course  I have reviewed the triage vital signs and the nursing notes.  Pertinent labs & imaging results that were available during my care of the patient were reviewed by me and considered in my medical decision making (see chart for details).  30 year old presents for evaluation of upper respiratory infectious symptoms.  On arrival she is afebrile, nonseptic, not ill-appearing.  She is vaccine against COVID.  Did have positive Covid exposure.  She denies any chest pain, shortness of breath, hemoptysis.  Her heart is clear.  She does have some very minimal wheeze which clears with cough.  Her abdomen is soft, nontender.  She has a nonfocal neuro exam without deficits.  She has no unilateral leg  swelling, redness or warmth.  No history of PE, DVT, recent surgery or immobilization.  No clinical evidence of DVT on exam.  She is without tachycardia, tachypnea or hypoxia.  Her oropharynx clear.  No evidence of PTA or RPA.  Patient had labs and imaging obtained from triage which I personally reviewed and interpreted CBC without leukocytosis, hemoglobin stable Metabolic panel with mild hyperglycemia to 101, no additional electrolyte, renal or liver abnormality Chest x-ray without cardiomegaly, pulmonary edema, pneumothorax, infiltrates COVID, FLU, RSV pending>>> Patient will follow up on labs on [facility] CHART  Patient without any acute respiratory distress.  Given does have wheeze, albuterol has been helping at home question bronchitis.  Her Covid test is pending at discharge.  Will give short course of steroids, encouraged continued home remedies and return for any worsening symptoms.  The patient has been appropriately medically screened and/or stabilized in the ED. I have low suspicion for any other emergent medical condition which would require further screening, evaluation or treatment in the ED or require inpatient management.  Patient is hemodynamically stable and in no acute distress.  Patient able to ambulate in department prior to ED.  Evaluation does not show acute pathology that would require ongoing or additional emergent interventions while in the emergency department or further inpatient treatment.  I have discussed the diagnosis with the patient and answered all questions.  Pain is been managed while in the emergency department and patient has no further complaints prior to discharge.  Patient is comfortable with plan discussed in room and is stable for discharge at this time.  I have discussed strict return precautions for returning to the emergency department.  Patient was encouraged to follow-up with PCP/specialist refer to at discharge.    MDM Rules/Calculators/A&P                           Sherrine Salberg Weissman was evaluated in Emergency Department on 08/12/2020 for the symptoms described in the history of present illness. She was evaluated in the context of the global COVID-19 pandemic, which necessitated consideration that the patient might be at risk for infection with the SARS-CoV-2 virus that causes COVID-19. Institutional protocols and algorithms that pertain to the evaluation of patients at risk for COVID-19 are in a state of rapid change based on information released  by regulatory bodies including the CDC and federal and state organizations. These policies and algorithms were followed during the patient's care in the ED. Final Clinical Impression(s) / ED Diagnoses Final diagnoses:  Viral URI with cough  Person under investigation for COVID-19    Rx / DC Orders ED Discharge Orders         Ordered    predniSONE (DELTASONE) 50 MG tablet  Daily        08/12/20 1317    benzonatate (TESSALON) 100 MG capsule  Every 8 hours        08/12/20 1317    fluticasone (FLONASE) 50 MCG/ACT nasal spray  Daily        08/12/20 1317    Cetirizine HCl (ZYRTEC ALLERGY) 10 MG CAPS        08/12/20 1317           Salathiel Ferrara A, PA-C 08/12/20 1318    Margarita Grizzle, MD 08/12/20 1952

## 2020-08-12 NOTE — ED Triage Notes (Signed)
Patient states she feels 'yucky.' Has chest tightness when she coughs, has coughed x2 weeks. Mucinex and dayquil without relief. Denies fever, but states she has chills and body sweats.

## 2020-08-12 NOTE — ED Notes (Signed)
Called for vital sign recheck x1 without answer. 

## 2020-09-26 ENCOUNTER — Emergency Department (HOSPITAL_COMMUNITY)
Admission: EM | Admit: 2020-09-26 | Discharge: 2020-09-26 | Disposition: A | Discharge status: Home / Self Care | Payer: Self-pay | Attending: Emergency Medicine | Admitting: Emergency Medicine

## 2020-09-26 ENCOUNTER — Encounter (HOSPITAL_COMMUNITY): Payer: Self-pay

## 2020-09-26 ENCOUNTER — Emergency Department (HOSPITAL_COMMUNITY): Payer: Self-pay

## 2020-09-26 DIAGNOSIS — F17290 Nicotine dependence, other tobacco product, uncomplicated: Secondary | ICD-10-CM | POA: Insufficient documentation

## 2020-09-26 DIAGNOSIS — R0789 Other chest pain: Secondary | ICD-10-CM | POA: Insufficient documentation

## 2020-09-26 DIAGNOSIS — M67432 Ganglion, left wrist: Secondary | ICD-10-CM | POA: Insufficient documentation

## 2020-09-26 LAB — CBC
HCT: 41.3 % (ref 36.0–46.0)
Hemoglobin: 14.6 g/dL (ref 12.0–15.0)
MCH: 29.9 pg (ref 26.0–34.0)
MCHC: 35.4 g/dL (ref 30.0–36.0)
MCV: 84.6 fL (ref 80.0–100.0)
Platelets: 298 10*3/uL (ref 150–400)
RBC: 4.88 MIL/uL (ref 3.87–5.11)
RDW: 14 % (ref 11.5–15.5)
WBC: 9.2 10*3/uL (ref 4.0–10.5)
nRBC: 0 % (ref 0.0–0.2)

## 2020-09-26 LAB — BASIC METABOLIC PANEL
Anion gap: 10 (ref 5–15)
BUN: 12 mg/dL (ref 6–20)
CO2: 24 mmol/L (ref 22–32)
Calcium: 9.1 mg/dL (ref 8.9–10.3)
Chloride: 108 mmol/L (ref 98–111)
Creatinine, Ser: 0.87 mg/dL (ref 0.44–1.00)
GFR, Estimated: >60 mL/min (ref >60)
Glucose, Bld: 91 mg/dL (ref 70–99)
Potassium: 4.1 mmol/L (ref 3.5–5.1)
Sodium: 142 mmol/L (ref 135–145)

## 2020-09-26 LAB — I-STAT BETA HCG BLOOD, ED (MC, WL, AP ONLY): I-stat hCG, quantitative: <5 m[IU]/mL (ref <5)

## 2020-09-26 LAB — TROPONIN I (HIGH SENSITIVITY): Troponin I (High Sensitivity): <2 ng/L (ref <18)

## 2020-09-26 MED ORDER — ONDANSETRON 4 MG PO TBDP: 4.0000 mg | ORAL_TABLET | Freq: Three times a day (TID) | ORAL | 0 refills | Status: DC | PRN

## 2020-09-26 MED ORDER — ONDANSETRON 4 MG PO TBDP
4.0000 mg | ORAL_TABLET | Freq: Once | ORAL | Status: AC
  Administered 2020-09-26: 4 mg via ORAL
  Filled 2020-09-26: qty 1

## 2020-09-26 MED ORDER — ACETAMINOPHEN 325 MG PO TABS
650.0000 mg | ORAL_TABLET | Freq: Once | ORAL | Status: AC
  Administered 2020-09-26: 650 mg via ORAL
  Filled 2020-09-26: qty 2

## 2020-09-26 NOTE — ED Triage Notes (Signed)
Pt presents with c/o chest pain that started last night, middle of her chest. Pt also reports pain in her left wrist, reports there is a knot on the area, pain has been present for several months.

## 2020-09-26 NOTE — ED Provider Notes (Signed)
[facility]-EMERGENCY DEPT Provider Note   CSN: [id number] Arrival date & time: 09/26/20  1130     History Chief Complaint  Patient presents with  . Chest Pain  . Wrist Pain    Kelly Crosby is a 30 y.o. female with past medical history of A. fib, anxiety, depression, schizoaffective disorder, cannabis use disorder that presents emergency department today for chest pain.  Patient states that she has been having chest pain for 2 days, worsened last night.  States that it is in the middle of her chest.  Does not radiate anywhere.  States that she has been under a lot of stress and anxiety recently, worsening for the past couple of days, due to her daughter.  States that chest pain is intermittent.  Feels like a sharp sensation when it comes, lasts a couple seconds.  Is not pleuritic.  Has currently been present for the past morning.  Does not rate anywhere.  Is not worse with exertion.  Denies any cardiac history besides A. fib.  Is not on any rate control, no anticoagulant.  No leg swelling or palpitations. denies any fevers, chills, vomiting.  Patient states that he does feel slightly nauseous, feels as if her stomach is slightly upset.  No abdominal pain.  No back pain.  Denies any myalgias, cough or URI symptoms.  Denies any heavy lifting, chance pregnancy.  Patient also reports that she has had a small knot in her left wrist which been present for the past several months, wants this evaluated.  Denies any numbness and tingling into her hand, denies any trauma.  Denies any pus coming out of this.  No fevers or chills.  No redness around this.  Patient does have primary care provider.  No history of acid reflux, no regurgitation, sore mouth or burning sensation.  No other complaints.  HPI     Past Medical History:  Diagnosis Date  . A-fib (HCC)   . Anxiety   . Depression   . Schizotaxia     Patient Active Problem List   Diagnosis Date Noted  . Schizoaffective  disorder (HCC) 09/26/2016  . Cannabis use disorder, severe, dependence (HCC) 09/26/2016    Past Surgical History:  Procedure Laterality Date  . NO PAST SURGERIES       OB History   No obstetric history on file.     Family History  Problem Relation Age of Onset  . Hypertension Other   . Hypertension Mother   . Post-traumatic stress disorder Father     Social History   Tobacco Use  . Smoking status: Current Some Day Smoker    Types: Cigars  . Smokeless tobacco: Never Used  Vaping Use  . Vaping Use: Former  Substance Use Topics  . Alcohol use: Yes  . Drug use: Yes    Types: Marijuana    Home Medications Prior to Admission medications   Medication Sig Start Date End Date Taking? Authorizing Provider  ondansetron (ZOFRAN ODT) 4 MG disintegrating tablet Take 1 tablet (4 mg total) by mouth every 8 (eight) hours as needed for nausea or vomiting. 09/26/20  Yes Cherylann Hobday, Donne Anon, PA-C  albuterol (PROVENTIL HFA;VENTOLIN HFA) 108 (90 Base) MCG/ACT inhaler Inhale 1-2 puffs into the lungs every 6 (six) hours as needed for wheezing or shortness of breath. 06/19/18   Elson Areas, PA-C  benzonatate (TESSALON) 100 MG capsule Take 1 capsule (100 mg total) by mouth every 8 (eight) hours. 08/12/20   Henderly, Britni  A, PA-C  Cetirizine HCl (ZYRTEC ALLERGY) 10 MG CAPS Take 1 tablet daily 08/12/20   Henderly, Britni A, PA-C  fluticasone (FLONASE) 50 MCG/ACT nasal spray Place 2 sprays into both nostrils daily. 08/12/20   Henderly, Britni A, PA-C  ibuprofen (ADVIL,MOTRIN) 200 MG tablet Take 200 mg by mouth every 6 (six) hours as needed for moderate pain.    [provider]  ibuprofen (ADVIL,MOTRIN) 600 MG tablet Take 1 tablet (600 mg total) by mouth every 6 (six) hours as needed. 06/19/18   Elson AreasSofia, Leslie K, PA-C  predniSONE (DELTASONE) 50 MG tablet Take 1 tablet (50 mg total) by mouth daily. 08/12/20   Henderly, Britni A, PA-C  Prenatal Vit-Fe Fumarate-FA (PRENATAL MULTIVITAMIN) TABS tablet Take 1  tablet by mouth every evening.    [provider]    Allergies    Amoxicillin  Review of Systems   Review of Systems  Constitutional: Negative for chills, diaphoresis, fatigue and fever.  HENT: Negative for congestion, sore throat and trouble swallowing.   Eyes: Negative for pain and visual disturbance.  Respiratory: Negative for cough, shortness of breath and wheezing.   Cardiovascular: Positive for chest pain. Negative for palpitations and leg swelling.  Gastrointestinal: Positive for nausea. Negative for abdominal distention, abdominal pain, diarrhea and vomiting.  Genitourinary: Negative for difficulty urinating.  Musculoskeletal: Negative for back pain, neck pain and neck stiffness.  Skin: Negative for pallor.  Neurological: Negative for dizziness, speech difficulty, weakness and headaches.  Psychiatric/Behavioral: Negative for confusion.    Physical Exam Updated Vital Signs BP (!) 106/57   Pulse 79   Temp 99.2 F (37.3 C) (Oral)   Resp 20   Ht 5\' 6"  (1.676 m)   Wt 114 kg   SpO2 96%   BMI 40.56 kg/m   Physical Exam Constitutional:      General: She is not in acute distress.    Appearance: Normal appearance. She is not ill-appearing, toxic-appearing or diaphoretic.  HENT:     Mouth/Throat:     Mouth: Mucous membranes are moist.     Pharynx: Oropharynx is clear.  Eyes:     General: No scleral icterus.    Extraocular Movements: Extraocular movements intact.     Pupils: Pupils are equal, round, and reactive to light.  Cardiovascular:     Rate and Rhythm: Normal rate and regular rhythm.     Pulses: Normal pulses.     Heart sounds: Normal heart sounds.  Pulmonary:     Effort: Pulmonary effort is normal. No respiratory distress.     Breath sounds: Normal breath sounds. No stridor. No wheezing, rhonchi or rales.  Chest:     Chest wall: No tenderness.       Comments: Patient with tenderness in this area, no erythema or warmth.  No rashes.  No flail  chest. Abdominal:     General: Abdomen is flat. There is no distension.     Palpations: Abdomen is soft.     Tenderness: There is no abdominal tenderness. There is no guarding or rebound.  Musculoskeletal:        General: No swelling or tenderness. Normal range of motion.       Hands:     Cervical back: Normal range of motion and neck supple. No rigidity.     Right lower leg: No edema.     Left lower leg: No edema.     Comments: Patient with 1 cm oval cystlike structure in the left side of wrist, no  erythema or warmth.  Is not draining.  Appears as if it is fluid-filled.  Skin:    General: Skin is warm and dry.     Capillary Refill: Capillary refill takes less than 2 seconds.     Coloration: Skin is not pale.  Neurological:     General: No focal deficit present.     Mental Status: She is alert and oriented to person, place, and time.  Psychiatric:        Mood and Affect: Mood normal.        Behavior: Behavior normal.     ED Results / Procedures / Treatments   Labs (all labs ordered are listed, but only abnormal results are displayed) Labs Reviewed  BASIC METABOLIC PANEL  CBC  I-STAT BETA HCG BLOOD, ED ([facility], [facility], [facility] ONLY)  TROPONIN I (HIGH SENSITIVITY)    EKG None  Radiology DG Chest 2 View  Result Date: 09/26/2020 CLINICAL DATA:  Chest pain EXAM: CHEST - 2 VIEW COMPARISON:  August 12, 2020 FINDINGS: The lungs are clear. The heart size and pulmonary vascularity are normal. No adenopathy. No pneumothorax. No bone lesions. IMPRESSION: Lungs clear.  Heart size normal. Electronically Signed   By: Bretta Bang III M.D.   On: 09/26/2020 12:20    Procedures Procedures   Medications Ordered in ED Medications  ondansetron (ZOFRAN-ODT) disintegrating tablet 4 mg (4 mg Oral Given 09/26/20 1316)  acetaminophen (TYLENOL) tablet 650 mg (650 mg Oral Given 09/26/20 1316)    ED Course  I have reviewed the triage vital signs and the nursing notes.  Pertinent labs & imaging  results that were available during my care of the patient were reviewed by me and considered in my medical decision making (see chart for details).    MDM Rules/Calculators/A&P                         Kelly Crosby is a 30 y.o. female with past medical history of A. fib, anxiety, depression, schizoaffective disorder, cannabis use disorder that presents emergency department today for chest pain.  Work-up today benign.  EKG interpreted me without any signs of ischemia..  Chest x-ray interpreted me without any acute cardiopulmonary disease.  Appears as if this is musculoskeletal, also feels as if this has to do with anxiety especially with patient's nausea.  Patient agrees.  After Tylenol and Zofran given, patient states that she does feel a lot better.  Patient's wrist most likely ganglion cyst, patient will follow up with PCP about this. Distally neurovascular intact.  Normal vitals, patient to be discharged.     Patient is to be discharged with recommendation to follow up with PCP in regards to today's hospital visit. Chest pain is not likely of cardiac or pulmonary etiology d/t presentation, PERC negative, VSS, no tracheal deviation, no JVD or new murmur, RRR, breath sounds equal bilaterally, EKG without acute abnormalities, negative troponin, and negative CXR. Pt has been advised to return to the ED if CP becomes exertional, associated with diaphoresis or nausea, radiates to left jaw/arm, worsens or becomes concerning in any way. Pt appears reliable for follow up and is agreeable to discharge.    Final Clinical Impression(s) / ED Diagnoses Final diagnoses:  Chest wall pain  Ganglion cyst of dorsum of left wrist    Rx / DC Orders ED Discharge Orders         Ordered    ondansetron (ZOFRAN ODT) 4 MG disintegrating tablet  Every 8 hours PRN        09/26/20 1356           Farrel Gordon, PA-C 09/26/20 1359    Derwood Kaplan, MD 09/26/20 1505

## 2020-09-26 NOTE — Discharge Instructions (Addendum)
Your work-up today was reassuring, take Tylenol as directed on the bottle for pain for your chest wall pain.  I also prescribed you Zofran, you can use this as needed for your nausea.  I think your symptoms are also related to anxiety, please use the attached instructions.  I also attached instructions on ganglion cyst, you can follow-up with your PCP about this.  If you have any new worsening concerning symptoms please come back to the emergency department.  Please follow-up with your PCP in the next couple days.  Please speak to your pharmacist about any new medications prescribed today in regards to side effects or interactions with other medications.

## 2021-01-26 ENCOUNTER — Emergency Department (HOSPITAL_COMMUNITY)
Admission: EM | Admit: 2021-01-26 | Discharge: 2021-01-26 | Disposition: A | Discharge status: Home / Self Care | Payer: Medicaid Other | Attending: Emergency Medicine | Admitting: Emergency Medicine

## 2021-01-26 ENCOUNTER — Encounter (HOSPITAL_COMMUNITY): Payer: Self-pay | Admitting: *Deleted

## 2021-01-26 DIAGNOSIS — R350 Frequency of micturition: Secondary | ICD-10-CM | POA: Insufficient documentation

## 2021-01-26 DIAGNOSIS — F17290 Nicotine dependence, other tobacco product, uncomplicated: Secondary | ICD-10-CM | POA: Insufficient documentation

## 2021-01-26 DIAGNOSIS — M546 Pain in thoracic spine: Secondary | ICD-10-CM | POA: Insufficient documentation

## 2021-01-26 LAB — URINALYSIS, ROUTINE W REFLEX MICROSCOPIC
Bilirubin Urine: NEGATIVE
Glucose, UA: NEGATIVE mg/dL
Hgb urine dipstick: NEGATIVE
Ketones, ur: NEGATIVE mg/dL
Leukocytes,Ua: NEGATIVE
Nitrite: POSITIVE — AB
Protein, ur: NEGATIVE mg/dL
Specific Gravity, Urine: 1.02 (ref 1.005–1.030)
pH: 5 (ref 5.0–8.0)

## 2021-01-26 LAB — POC URINE PREG, ED: Preg Test, Ur: NEGATIVE

## 2021-01-26 MED ORDER — LIDOCAINE 5 % EX PTCH
1.0000 | MEDICATED_PATCH | Freq: Once | CUTANEOUS | Status: DC
  Administered 2021-01-26: 1 via TRANSDERMAL
  Filled 2021-01-26: qty 1

## 2021-01-26 MED ORDER — ACETAMINOPHEN 325 MG PO TABS
650.0000 mg | ORAL_TABLET | Freq: Once | ORAL | Status: AC
  Administered 2021-01-26: 650 mg via ORAL
  Filled 2021-01-26: qty 2

## 2021-01-26 MED ORDER — NITROFURANTOIN MONOHYD MACRO 100 MG PO CAPS: 100.0000 mg | ORAL_CAPSULE | Freq: Two times a day (BID) | ORAL | 0 refills | Status: DC

## 2021-01-26 NOTE — ED Provider Notes (Signed)
[facility]-EMERGENCY DEPT Provider Note   CSN: [id number] Arrival date & time: 01/26/21  0746     History Chief Complaint  Patient presents with   Back Pain    Kelly Crosby is a 30 y.o. female with past medical history significant for A. fib, anxiety, schizoaffective disorder.  HPI Patient presents to emergency department today with chief complaint of left mid back pain x1 day.  She states she noticed the pain this morning.  She describes as a sharp sensation.  Pain does not radiate.  She states she is been doing a lot of driving lately and thinks it might be from rotating in the car to check her blind spot.  She rates the pain currently 6 of 10 in severity.  She has not tried any over-the-counter medications for symptoms prior to arrival.  Patient is also endorsing urinary frequency which is new for the last x4 days.  She is unsure of chance of pregnancy, currently gets Depo injections for birth control.  Patient admits to smoking marijuana and cigarettes, denies any other drug use. Denies fevers, weight loss, numbness/weakness of upper and lower extremities, bowel/bladder incontinence, urinary retention, history of cancer, saddle anesthesia, history of back surgery, history of IVDA.  She also denies any pelvic pain, abnormal vaginal bleeding or vaginal discharge.   Past Medical History:  Diagnosis Date   A-fib [facility])    Anxiety    Depression    Schizotaxia     Patient Active Problem List   Diagnosis Date Noted   Schizoaffective disorder (HCC) 09/26/2016   Cannabis use disorder, severe, dependence (HCC) 09/26/2016    Past Surgical History:  Procedure Laterality Date   NO PAST SURGERIES       OB History   No obstetric history on file.     Family History  Problem Relation Age of Onset   Hypertension Other    Hypertension Mother    Post-traumatic stress disorder Father     Social History   Tobacco Use   Smoking status: Some Days    Pack  years: 0.00    Types: Cigars   Smokeless tobacco: Never  Vaping Use   Vaping Use: Former  Substance Use Topics   Alcohol use: Yes   Drug use: Yes    Types: Marijuana    Home Medications Prior to Admission medications   Medication Sig Start Date End Date Taking? Authorizing Provider  nitrofurantoin, macrocrystal-monohydrate, (MACROBID) 100 MG capsule Take 1 capsule (100 mg total) by mouth 2 (two) times daily. 01/26/21  Yes Walisiewicz, Taniqua Issa E, PA-C  albuterol (PROVENTIL HFA;VENTOLIN HFA) 108 (90 Base) MCG/ACT inhaler Inhale 1-2 puffs into the lungs every 6 (six) hours as needed for wheezing or shortness of breath. 06/19/18   Elson Areas, PA-C  benzonatate (TESSALON) 100 MG capsule Take 1 capsule (100 mg total) by mouth every 8 (eight) hours. 08/12/20   Henderly, Britni A, PA-C  Cetirizine HCl (ZYRTEC ALLERGY) 10 MG CAPS Take 1 tablet daily 08/12/20   Henderly, Britni A, PA-C  fluticasone (FLONASE) 50 MCG/ACT nasal spray Place 2 sprays into both nostrils daily. 08/12/20   Henderly, Britni A, PA-C  ibuprofen (ADVIL,MOTRIN) 200 MG tablet Take 200 mg by mouth every 6 (six) hours as needed for moderate pain.    [provider]  ibuprofen (ADVIL,MOTRIN) 600 MG tablet Take 1 tablet (600 mg total) by mouth every 6 (six) hours as needed. 06/19/18   Elson Areas, PA-C  ondansetron (ZOFRAN ODT)  4 MG disintegrating tablet Take 1 tablet (4 mg total) by mouth every 8 (eight) hours as needed for nausea or vomiting. 09/26/20   Farrel Gordon, PA-C  predniSONE (DELTASONE) 50 MG tablet Take 1 tablet (50 mg total) by mouth daily. 08/12/20   Henderly, Britni A, PA-C  Prenatal Vit-Fe Fumarate-FA (PRENATAL MULTIVITAMIN) TABS tablet Take 1 tablet by mouth every evening.    [provider]    Allergies    Amoxicillin  Review of Systems   Review of Systems  Genitourinary:  Positive for frequency.  Musculoskeletal:  Positive for back pain.   Physical Exam Updated Vital Signs BP 121/78 (BP  Location: Left Arm)   Pulse 100   Temp 98.1 F (36.7 C) (Oral)   Resp 18   SpO2 100%   Physical Exam Vitals and nursing note reviewed.  Constitutional:      Appearance: She is well-developed. She is not ill-appearing or toxic-appearing.  HENT:     Head: Normocephalic and atraumatic.     Nose: Nose normal.  Eyes:     General: No scleral icterus.       Right eye: No discharge.        Left eye: No discharge.     Conjunctiva/sclera: Conjunctivae normal.  Neck:     Vascular: No JVD.  Cardiovascular:     Rate and Rhythm: Normal rate and regular rhythm.     Pulses: Normal pulses.     Heart sounds: Normal heart sounds.  Pulmonary:     Effort: Pulmonary effort is normal.     Breath sounds: Normal breath sounds.  Abdominal:     General: There is no distension.     Palpations: Abdomen is soft.     Comments: No abdominal tenderness.    No peritoneal signs. No CVA tenderness.  Musculoskeletal:        General: Normal range of motion.     Cervical back: Normal range of motion.       Back:  Skin:    General: Skin is warm and dry.  Neurological:     Mental Status: She is oriented to person, place, and time.     GCS: GCS eye subscore is 4. GCS verbal subscore is 5. GCS motor subscore is 6.     Comments: Sensation grossly intact to light touch in the lower extremities bilaterally. No saddle anesthesias. Strength 5/5 with flexion and extension at the bilateral hips, knees, and ankles. No noted gait deficit. Coordination intact with heel to shin testing.    Psychiatric:        Behavior: Behavior normal.    ED Results / Procedures / Treatments   Labs (all labs ordered are listed, but only abnormal results are displayed) Labs Reviewed  URINALYSIS, ROUTINE W REFLEX MICROSCOPIC - Abnormal; Notable for the following components:      Result Value   Nitrite POSITIVE (*)    Bacteria, UA MANY (*)    All other components within normal limits  URINE CULTURE  POC URINE PREG, ED     EKG None  Radiology No results found.  Procedures Procedures   Medications Ordered in ED Medications  lidocaine (LIDODERM) 5 % 1 patch (1 patch Transdermal Patch Applied 01/26/21 0924)  acetaminophen (TYLENOL) tablet 650 mg (650 mg Oral Given 01/26/21 7846)    ED Course  I have reviewed the triage vital signs and the nursing notes.  Pertinent labs & imaging results that were available during my care of the patient were  reviewed by me and considered in my medical decision making (see chart for details).    MDM Rules/Calculators/A&P                          History provided by patient with additional history obtained from chart review.    Patient with back pain.  No neurological deficits and normal neuro exam.  Patient can walk but states is painful.  No loss of bowel or bladder control.  No concern for cauda equina.  No fever, night sweats, weight loss, h/o cancer, IVDU.  RICE protocol and pain medicine indicated and discussed with patient.  Likely MSK strain.  Patient given lidocaine patch and Tylenol. UA has positive nitrites and many bacteria. Urine pregnancy is negative.  We will send urine culture.  As patient is symptomatic we will treat for UTI.  Patient prescribed Macrobid as she has history of allergy to amoxicillin.  Reaction with yeast infections I doubt true allergy however will proceed with Macrobid.  Low suspicion for pyelonephritis as patient has no CVA tenderness no fever, no abdominal tenderness.  Exam not concerning for sepsis. On reassessment pain has improved.  Will have patient follow-up with PCP for recheck.  The patient appears reasonably screened and/or stabilized for discharge and I doubt any other medical condition or other [facility] requiring further screening, evaluation, or treatment in the ED at this time prior to discharge. The patient is safe for discharge with strict return precautions discussed.    Portions of this note were generated with Administrator, sports. Dictation errors may occur despite best attempts at proofreading.   Final Clinical Impression(s) / ED Diagnoses Final diagnoses:  Urinary frequency  Acute left-sided thoracic back pain    Rx / DC Orders ED Discharge Orders          Ordered    nitrofurantoin, macrocrystal-monohydrate, (MACROBID) 100 MG capsule  2 times daily        01/26/21 1013             Shanon Ace, PA-C 01/26/21 1018    Bethann Berkshire, MD 01/28/21 1010

## 2021-01-26 NOTE — Discharge Instructions (Signed)
Your back pain should be treated with medicines such as ibuprofen or tylenol and this back pain should get better over the next 2 weeks.   Prescription sent to your pharmacy for Macrobid.  This is to treat urinary tract infections.  Take as prescribed.  Follow Up: Please follow up with your primary healthcare provider in 1-2 weeks for reassessment. if you do not have a primary care doctor use the resource guide provided to find one.  Low back pain is discomfort in the lower back that may be due to injuries to muscles and ligaments around the spine. Occasionally, it may be caused by a a problem to a part of the spine called a disc. The pain may last several days or a week;  However, most patients get completely well in 4 weeks.   1. Medications: Alternate 600 mg of ibuprofen and [phone] mg of Tylenol every 3 hours as needed for pain. Do not exceed 4000 mg of Tylenol daily.  Take ibuprofen with food to avoid upset stomach issues.   2. Treatment: rest, drink plenty of fluids, gentle stretching as discussed (see attached), alternate ice and heat (or stick with whichever feels best) 20 minutes on 20 minutes off. Maintaining your daily activities, including walking, is encourged, as it will help you get better faster than just staying in bed.    Be aware that if you develop new symptoms, such as a fever, leg weakness, difficulty with or loss of control of your urine or bowels, abdominal pain, or more severe pain, you will need to seek medical attention immediately and  / or return to the Emergency department.

## 2021-01-26 NOTE — ED Triage Notes (Signed)
Pt complains of upper back pain since this morning, worse with movement. She believes the pain is from checking blind spots in her car.

## 2021-01-28 LAB — URINE CULTURE: Culture: >=100000 — AB

## 2021-01-29 ENCOUNTER — Telehealth: Payer: Self-pay | Admitting: Emergency Medicine

## 2021-01-29 NOTE — Telephone Encounter (Signed)
Post ED Visit - Positive Culture Follow-up  Culture report reviewed by antimicrobial stewardship pharmacist: [facility] []  Nathan Batchelder, Pharm.D. []  , Pharm.D., BCPS AQ-ID []  , Pharm.D., BCPS []  Celedonio Miyamoto, Pharm.D., BCPS []  [place], Garvin Fila.D., BCPS, AAHIVP []  , Pharm.D., BCPS, AAHIVP []  Georgina Pillion, PharmD, BCPS []  , PharmD, BCPS []  [place], PharmD, BCPS []  [address], PharmD []  , PharmD, BCPS []  Estella Husk, PharmD  Pharmacy Team []  Lysle Pearl, PharmD []  , PharmD []  Phillips Climes, PharmD []  , Rph []  Agapito Games) , PharmD []  Verlan Friends, PharmD []  , PharmD []  Mervyn Gay, PharmD []  , PharmD []  Vinnie Level, PharmD []  [facility], PharmD []  , PharmD []  Len Childs, PharmD   Positive urine culture Treated with nitrofurantoin, organism sensitive to the same and no further patient follow-up is required at this time.  01/29/2021, 9:13 AM

## 2021-03-06 ENCOUNTER — Emergency Department (HOSPITAL_COMMUNITY)
Admission: EM | Admit: 2021-03-06 | Discharge: 2021-03-07 | Disposition: A | Discharge status: Home / Self Care | Payer: Medicaid Other | Attending: Emergency Medicine | Admitting: Emergency Medicine

## 2021-03-06 DIAGNOSIS — F17290 Nicotine dependence, other tobacco product, uncomplicated: Secondary | ICD-10-CM | POA: Insufficient documentation

## 2021-03-06 DIAGNOSIS — U071 COVID-19: Secondary | ICD-10-CM | POA: Insufficient documentation

## 2021-03-07 ENCOUNTER — Other Ambulatory Visit: Payer: Self-pay

## 2021-03-07 ENCOUNTER — Encounter (HOSPITAL_COMMUNITY): Payer: Self-pay | Admitting: Emergency Medicine

## 2021-03-07 LAB — RESP PANEL BY RT-PCR (FLU A&B, COVID) ARPGX2
Influenza A by PCR: NEGATIVE
Influenza B by PCR: NEGATIVE
SARS Coronavirus 2 by RT PCR: POSITIVE — AB

## 2021-03-07 LAB — POC URINE PREG, ED: Preg Test, Ur: NEGATIVE

## 2021-03-07 MED ORDER — ONDANSETRON 4 MG PO TBDP: 4.0000 mg | ORAL_TABLET | Freq: Three times a day (TID) | ORAL | 0 refills | Status: DC | PRN

## 2021-03-07 MED ORDER — IBUPROFEN 200 MG PO TABS
600.0000 mg | ORAL_TABLET | Freq: Once | ORAL | Status: AC
  Administered 2021-03-07: 600 mg via ORAL
  Filled 2021-03-07: qty 3

## 2021-03-07 NOTE — ED Triage Notes (Signed)
Patient complaining of generalized body aches, chills, and a wet cough that started Monday.

## 2021-03-07 NOTE — ED Provider Notes (Signed)
[facility]   CSN: [id number] Arrival date & time: 03/06/21  2325     History Chief Complaint  Patient presents with   Cough         Generalized Body Aches    Kelly Crosby is a 30 y.o. female with a history of A. fib, anxiety, schizoaffective disorder who presents the emergency department with a chief complaint of myalgias.  Patient endorses a 3-day history of myalgias, chills, and a an intermittently productive cough.  Symptoms have been constant, gradually worsening since onset.  No known aggravating or alleviating factors.  She endorses nausea and a poor appetite, and diarrhea.  She denies fever, shortness of breath, chest pain, vomiting, sore throat, rhinorrhea, rash, dysuria, hematuria, headache, dizziness, or lightheadedness.  She has been taking Mucinex with minimal improvement.  No known sick contacts.  Patient is vaccinated against COVID-19, but has not received a booster shot.  Patient is unsure about concerns for pregnancy.  She is on the Depo shot, which was last given in June.  The history is provided by medical records and the patient. No language interpreter was used.      Past Medical History:  Diagnosis Date   A-fib [facility])    Anxiety    Depression    Schizotaxia     Patient Active Problem List   Diagnosis Date Noted   Schizoaffective disorder (HCC) 09/26/2016   Cannabis use disorder, severe, dependence (HCC) 09/26/2016    Past Surgical History:  Procedure Laterality Date   NO PAST SURGERIES       OB History   No obstetric history on file.     Family History  Problem Relation Age of Onset   Hypertension Other    Hypertension Mother    Post-traumatic stress disorder Father     Social History   Tobacco Use   Smoking status: Some Days    Types: Cigars   Smokeless tobacco: Never  Vaping Use   Vaping Use: Former  Substance Use Topics   Alcohol use: Yes   Drug use: Yes    Types: Marijuana     Home Medications Prior to Admission medications   Medication Sig Start Date End Date Taking? Authorizing Provider  benzonatate (TESSALON) 100 MG capsule Take 1 capsule (100 mg total) by mouth every 8 (eight) hours. 08/12/20   Henderly, Britni A, PA-C  Cetirizine HCl (ZYRTEC ALLERGY) 10 MG CAPS Take 1 tablet daily 08/12/20   Henderly, Britni A, PA-C  fluticasone (FLONASE) 50 MCG/ACT nasal spray Place 2 sprays into both nostrils daily. 08/12/20   Henderly, Britni A, PA-C  nitrofurantoin, macrocrystal-monohydrate, (MACROBID) 100 MG capsule Take 1 capsule (100 mg total) by mouth 2 (two) times daily. 01/26/21   Walisiewicz, Yvonna Alanis E, PA-C  ondansetron (ZOFRAN ODT) 4 MG disintegrating tablet Take 1 tablet (4 mg total) by mouth every 8 (eight) hours as needed. 03/07/21   Sharlyn Odonnel A, PA-C  predniSONE (DELTASONE) 50 MG tablet Take 1 tablet (50 mg total) by mouth daily. 08/12/20   Henderly, Britni A, PA-C  Prenatal Vit-Fe Fumarate-FA (PRENATAL MULTIVITAMIN) TABS tablet Take 1 tablet by mouth every evening.    [provider]    Allergies    Amoxicillin  Review of Systems   Review of Systems  Constitutional:  Positive for appetite change and chills. Negative for activity change, diaphoresis and fever.  HENT:  Negative for congestion, sinus pressure, sneezing and sore throat.   Respiratory:  Positive for  cough. Negative for shortness of breath and wheezing.   Cardiovascular:  Negative for chest pain and palpitations.  Gastrointestinal:  Positive for nausea. Negative for abdominal pain, constipation, diarrhea and vomiting.  Genitourinary:  Negative for dysuria, flank pain, frequency, pelvic pain, vaginal bleeding and vaginal discharge.  Musculoskeletal:  Positive for myalgias. Negative for back pain.  Skin:  Negative for rash.  Allergic/Immunologic: Negative for immunocompromised state.  Neurological:  Negative for syncope, weakness, numbness and headaches.  Psychiatric/Behavioral:  Negative  for confusion.    Physical Exam Updated Vital Signs BP 125/86 (BP Location: Right Arm)   Pulse 79   Temp 98.1 F (36.7 C) (Oral)   Resp 18   Ht 5\' 6"  (1.676 m)   Wt 113.4 kg   SpO2 99%   BMI 40.35 kg/m   Physical Exam Vitals and nursing note reviewed.  Constitutional:      General: She is not in acute distress.    Appearance: She is not ill-appearing, toxic-appearing or diaphoretic.  HENT:     Head: Normocephalic.  Eyes:     Conjunctiva/sclera: Conjunctivae normal.  Cardiovascular:     Rate and Rhythm: Normal rate and regular rhythm.     Heart sounds: No murmur heard.   No friction rub. No gallop.  Pulmonary:     Effort: Pulmonary effort is normal. No respiratory distress.     Breath sounds: No stridor. No wheezing, rhonchi or rales.  Chest:     Chest wall: No tenderness.  Abdominal:     General: There is no distension.     Palpations: Abdomen is soft. There is no mass.     Tenderness: There is no abdominal tenderness. There is no right CVA tenderness, left CVA tenderness, guarding or rebound.     Hernia: No hernia is present.     Comments: Abdomen is soft, nontender, nondistended.  Musculoskeletal:        General: No tenderness.     Cervical back: Neck supple.     Right lower leg: No edema.     Left lower leg: No edema.  Skin:    General: Skin is warm.     Coloration: Skin is not jaundiced or pale.     Findings: No rash.  Neurological:     Mental Status: She is alert.  Psychiatric:        Behavior: Behavior normal.    ED Results / Procedures / Treatments   Labs (all labs ordered are listed, but only abnormal results are displayed) Labs Reviewed  RESP PANEL BY RT-PCR (FLU A&B, COVID) ARPGX2 - Abnormal; Notable for the following components:      Result Value   SARS Coronavirus 2 by RT PCR POSITIVE (*)    All other components within normal limits  POC URINE PREG, ED    EKG None  Radiology No results found.  Procedures Procedures   Medications  Ordered in ED Medications  ibuprofen (ADVIL) tablet 600 mg (600 mg Oral Given 03/07/21 0440)    ED Course  I have reviewed the triage vital signs and the nursing notes.  Pertinent labs & imaging results that were available during my care of the patient were reviewed by me and considered in my medical decision making (see chart for details).    MDM Rules/Calculators/A&P                           30 year old female with a history of A. fib, anxiety,  schizoaffective disorder who presents the emergency department with a 3-day history of URI symptoms, including chills, myalgias, cough, nausea, decreased appetite.  No fever, chest pain, shortness of breath.  She is vaccinated against COVID-19, but has not received a booster injection.  No known sick contacts.  Vital signs are unremarkable.  Physical exam is overall reassuring.  Labs have been reviewed and independently interpreted by me.  She tested positive for COVID-19.  Pregnancy test is negative.    Doubt community-acquired pneumonia, ectopic pregnancy, pancreatitis, gastritis, cholecystitis, ACS, or PE.  Since she is vaccinated and has no chronic medical conditions, no indication for oral antiviral therapy.  Recommended OTC analgesia and supportive care.  Home quarantine and isolation guidelines per CDC have been discussed.  All questions answered.  ER return precautions given.  She is hemodynamically stable in no acute distress.  Safe for discharge home with outpatient follow-up as needed.  Final Clinical Impression(s) / ED Diagnoses Final diagnoses:  COVID-19    Rx / DC Orders ED Discharge Orders          Ordered    ondansetron (ZOFRAN ODT) 4 MG disintegrating tablet  Every 8 hours PRN,   Status:  Discontinued        03/07/21 0454    ondansetron (ZOFRAN ODT) 4 MG disintegrating tablet  Every 8 hours PRN        03/07/21 0527             Janica Eldred A, PA-C 03/07/21 1610    Gilda Crease, MD 03/07/21 [id number]

## 2021-03-07 NOTE — Discharge Instructions (Addendum)
Thank you for allowing me to care for you today in the Emergency Department.   You tested positive for COVID-19 today.   Quarantine and isolation recommendations per the CDC is attached.  Take 650 mg of Tylenol or 600 mg of ibuprofen with food every 6 hours for pain.  You can alternate between these 2 medications every 3 hours if your pain returns.  For instance, you can take Tylenol at noon, followed by a dose of ibuprofen at 3, followed by second dose of Tylenol and 6.  Check the label on your Mucinex bottle.  If it contains acetaminophen, this is also Tylenol.  You should not take more than 4000 mg of Tylenol in a 24-hour period.  Make sure to get plenty of rest and drink plenty of fluids to avoid dehydration.  Return to the emergency department if you develop respiratory distress, if you have uncontrollable vomiting despite taking Zofran, patient making urine, if you become very sleepy and hard to wake up, or have other new, concerning symptoms.

## 2021-08-08 ENCOUNTER — Encounter (HOSPITAL_COMMUNITY): Payer: Self-pay

## 2021-08-08 ENCOUNTER — Other Ambulatory Visit: Payer: Self-pay

## 2021-08-08 ENCOUNTER — Ambulatory Visit (HOSPITAL_COMMUNITY)
Admission: EM | Admit: 2021-08-08 | Discharge: 2021-08-08 | Disposition: A | Discharge status: Home / Self Care | Payer: Self-pay | Attending: Internal Medicine | Admitting: Internal Medicine

## 2021-08-08 DIAGNOSIS — J069 Acute upper respiratory infection, unspecified: Secondary | ICD-10-CM | POA: Insufficient documentation

## 2021-08-08 DIAGNOSIS — Z20822 Contact with and (suspected) exposure to covid-19: Secondary | ICD-10-CM | POA: Insufficient documentation

## 2021-08-08 DIAGNOSIS — J039 Acute tonsillitis, unspecified: Secondary | ICD-10-CM | POA: Insufficient documentation

## 2021-08-08 LAB — POC INFLUENZA A AND B ANTIGEN (URGENT CARE ONLY)
INFLUENZA A ANTIGEN, POC: NEGATIVE
INFLUENZA B ANTIGEN, POC: NEGATIVE

## 2021-08-08 LAB — POCT RAPID STREP A, ED / UC: Streptococcus, Group A Screen (Direct): NEGATIVE

## 2021-08-08 MED ORDER — NITROFURANTOIN MONOHYD MACRO 100 MG PO CAPS: 100.0000 mg | ORAL_CAPSULE | Freq: Two times a day (BID) | ORAL | 0 refills | Status: DC

## 2021-08-08 MED ORDER — AZITHROMYCIN 500 MG PO TABS: 500.0000 mg | ORAL_TABLET | Freq: Every day | ORAL | 0 refills | Status: DC

## 2021-08-08 NOTE — Discharge Instructions (Addendum)
Stay quarantined until the covid test is back.  °

## 2021-08-08 NOTE — ED Triage Notes (Signed)
Pt presented to the office today for congestion and sore throat. Pt reports seeing white spots on the back of throat.

## 2021-08-08 NOTE — ED Provider Notes (Signed)
[place]    CSN: [id number] Arrival date & time: 08/08/21  1944      History   Chief Complaint Chief Complaint  Patient presents with   Sore Throat    Congestion     HPI Kelly Crosby is a 30 y.o. female who presents with ST and nose congestion x 2 days. Today has been having chills, but does not know if she had a fever. She saw white spots on her tonsils.     Past Medical History:  Diagnosis Date   A-fib [facility])    Anxiety    Depression    Schizotaxia     Patient Active Problem List   Diagnosis Date Noted   Schizoaffective disorder ([place]) 09/26/2016   Cannabis use disorder, severe, dependence ([place]) 09/26/2016    Past Surgical History:  Procedure Laterality Date   NO PAST SURGERIES      OB History   No obstetric history on file.      Home Medications    Prior to Admission medications   Medication Sig Start Date End Date Taking? Authorizing Provider  azithromycin (ZITHROMAX) 500 MG tablet Take 1 tablet (500 mg total) by mouth daily. 08/08/21   Rodriguez-Southworth, Sunday Spillers, PA-C  Cetirizine HCl (ZYRTEC ALLERGY) 10 MG CAPS Take 1 tablet daily 08/12/20   Henderly, Britni A, PA-C  fluticasone (FLONASE) 50 MCG/ACT nasal spray Place 2 sprays into both nostrils daily. 08/12/20   Henderly, Britni A, PA-C  ondansetron (ZOFRAN ODT) 4 MG disintegrating tablet Take 1 tablet (4 mg total) by mouth every 8 (eight) hours as needed. 03/07/21   McDonald, Mia A, PA-C  Prenatal Vit-Fe Fumarate-FA (PRENATAL MULTIVITAMIN) TABS tablet Take 1 tablet by mouth every evening.    [provider]    Family History Family History  Problem Relation Age of Onset   Hypertension Other    Hypertension Mother    Post-traumatic stress disorder Father     Social History Social History   Tobacco Use   Smoking status: Some Days    Types: Cigars   Smokeless tobacco: Never  Vaping Use   Vaping Use: Former  Substance Use Topics   Alcohol use: Yes   Drug use: Yes     Types: Marijuana     Allergies   Amoxicillin   Review of Systems Review of Systems  Constitutional:  Positive for chills and diaphoresis. Negative for appetite change, fatigue and fever.  HENT:  Positive for congestion, postnasal drip and sore throat. Negative for ear discharge, ear pain and trouble swallowing.   Eyes:  Negative for discharge.  Respiratory:  Positive for cough.   Hematological:  Negative for adenopathy.    Physical Exam Triage Vital Signs ED Triage Vitals  Enc Vitals Group     BP 08/08/21 2017 118/84     Pulse Rate 08/08/21 2017 82     Resp 08/08/21 2017 18     Temp --      Temp Source 08/08/21 2017 Oral     SpO2 08/08/21 2017 100 %     Weight --      Height --      Head Circumference --      Peak Flow --      Pain Score 08/08/21 2040 0     Pain Loc --      Pain Edu? --      Excl. in [place]? --    No data found.  Updated Vital Signs BP 118/84 (BP Location:  Left Arm)    Pulse 82    Resp 18    LMP  (Approximate)    SpO2 100%   Visual Acuity Right Eye Distance:   Left Eye Distance:   Bilateral Distance:    Right Eye Near:   Left Eye Near:    Bilateral Near:     Physical Exam Vitals and nursing note reviewed.  Constitutional:      General: She is not in acute distress.    Appearance: She is obese. She is not toxic-appearing.  HENT:     Right Ear: Tympanic membrane and ear canal normal.     Left Ear: Tympanic membrane and ear canal normal.     Mouth/Throat:     Mouth: Mucous membranes are moist.     Pharynx: Uvula midline. Posterior oropharyngeal erythema present.     Tonsils: Tonsillar exudate and tonsillar abscess present. 2+ on the right. 2+ on the left.  Eyes:     Conjunctiva/sclera: Conjunctivae normal.  Cardiovascular:     Rate and Rhythm: Normal rate and regular rhythm.  Pulmonary:     Effort: Pulmonary effort is normal.     Breath sounds: Normal breath sounds.  Musculoskeletal:     Cervical back: Normal range of motion.   Lymphadenopathy:     Cervical: Cervical adenopathy present.  Skin:    General: Skin is warm and dry.     Findings: No rash.  Neurological:     Mental Status: She is alert.  Psychiatric:        Mood and Affect: Mood normal.        Behavior: Behavior normal.     UC Treatments / Results  Labs (all labs ordered are listed, but only abnormal results are displayed) Labs Reviewed  SARS CORONAVIRUS 2 (TAT 6-24 HRS)  CULTURE, GROUP A STREP ([facility])  POC INFLUENZA A AND B ANTIGEN (URGENT CARE ONLY)  POCT RAPID STREP A, ED / UC  Rapid strep and FLuA&B are neg   EKG   Radiology No results found.  Procedures Procedures (including critical care time)  Medications Ordered in UC Medications - No data to display  Initial Impression / Assessment and Plan / UC Course  I have reviewed the triage vital signs and the nursing notes. Pertinent labs results that were available during my care of the patient were reviewed by me and considered in my medical decision making (see chart for details). Exudative Tonsillitis URI Covid test is pending Throat culture was sent out I placed her on Azithromycin as noted.   Final Clinical Impressions(s) / UC Diagnoses   Final diagnoses:  Exudative tonsillitis     Discharge Instructions      Stay quarantined until the covid test is back     ED Prescriptions     Medication Sig Dispense Auth. Provider   azithromycin (ZITHROMAX) 500 MG tablet  (Status: Discontinued) Take 1 tablet (500 mg total) by mouth daily. 5 tablet Rodriguez-Southworth, Jin Shockley, PA-C   nitrofurantoin, macrocrystal-monohydrate, (MACROBID) 100 MG capsule  (Status: Discontinued) Take 1 capsule (100 mg total) by mouth 2 (two) times daily. 10 capsule Rodriguez-Southworth, Nettie Elm, PA-C   azithromycin (ZITHROMAX) 500 MG tablet Take 1 tablet (500 mg total) by mouth daily. 5 tablet Rodriguez-Southworth, Nettie Elm, PA-C      PDMP not reviewed this encounter.   Garey Ham, Cordelia Poche 08/08/21 2056

## 2021-08-09 LAB — SARS CORONAVIRUS 2 (TAT 6-24 HRS): SARS Coronavirus 2: NEGATIVE

## 2021-08-11 LAB — CULTURE, GROUP A STREP (THRC)

## 2021-08-13 ENCOUNTER — Other Ambulatory Visit: Payer: Self-pay

## 2021-08-13 ENCOUNTER — Ambulatory Visit (HOSPITAL_COMMUNITY)
Admission: EM | Admit: 2021-08-13 | Discharge: 2021-08-13 | Disposition: A | Discharge status: Home / Self Care | Payer: Self-pay | Attending: Emergency Medicine | Admitting: Emergency Medicine

## 2021-08-13 ENCOUNTER — Encounter (HOSPITAL_COMMUNITY): Payer: Self-pay | Admitting: Emergency Medicine

## 2021-08-13 DIAGNOSIS — J039 Acute tonsillitis, unspecified: Secondary | ICD-10-CM

## 2021-08-13 LAB — POCT INFECTIOUS MONO SCREEN, ED / UC: Mono Screen: NEGATIVE

## 2021-08-13 MED ORDER — CLINDAMYCIN HCL 300 MG PO CAPS: 300.0000 mg | ORAL_CAPSULE | Freq: Four times a day (QID) | ORAL | 0 refills | Status: AC

## 2021-08-13 MED ORDER — PREDNISONE 20 MG PO TABS: 40.0000 mg | ORAL_TABLET | Freq: Every day | ORAL | 0 refills | Status: AC

## 2021-08-13 NOTE — ED Provider Notes (Signed)
[place]    CSN: [id number] Arrival date & time: 08/13/21  [id number]      History   Chief Complaint Chief Complaint  Patient presents with   Sore Throat   Cough   Nasal Congestion    HPI Kelly Crosby is a 31 y.o. female presenting with tonsillitis.  We last saw her on 12/29, at that time she was strep negative, flu negative, COVID-negative, strep culture was positive for rare strep B.  As she is penicillin allergic, she was treated with azithromycin, which helped minimally.  States she gets strep throat about twice a year.  She states continued pain with swallowing, pain with eating, though she is tolerating secretions without difficulty and denies shortness of breath.  Denies fever/chills.  Cough is persistent and productive of yellow and green sputum, denies shortness of breath, dizziness, chest pain.  States the lymph nodes in her neck are actually getting worse, which concerned her.  HPI  Past Medical History:  Diagnosis Date   A-fib [facility])    Anxiety    Depression    Schizotaxia     Patient Active Problem List   Diagnosis Date Noted   Schizoaffective disorder ([place]) 09/26/2016   Cannabis use disorder, severe, dependence ([place]) 09/26/2016    Past Surgical History:  Procedure Laterality Date   NO PAST SURGERIES      OB History   No obstetric history on file.      Home Medications    Prior to Admission medications   Medication Sig Start Date End Date Taking? Authorizing Provider  clindamycin (CLEOCIN) 300 MG capsule Take 1 capsule (300 mg total) by mouth every 6 (six) hours for 10 days. 08/13/21 08/23/21 Yes Hazel Sams, PA-C  predniSONE (DELTASONE) 20 MG tablet Take 2 tablets (40 mg total) by mouth daily for 5 days. Take with breakfast or lunch. Avoid NSAIDs (ibuprofen, etc) while taking this medication. 08/13/21 08/18/21 Yes Hazel Sams, PA-C  Prenatal Vit-Fe Fumarate-FA (PRENATAL MULTIVITAMIN) TABS tablet Take 1 tablet by mouth every evening.     [provider]    Family History Family History  Problem Relation Age of Onset   Hypertension Other    Hypertension Mother    Post-traumatic stress disorder Father     Social History Social History   Tobacco Use   Smoking status: Some Days    Types: Cigars   Smokeless tobacco: Never  Vaping Use   Vaping Use: Former  Substance Use Topics   Alcohol use: Yes   Drug use: Yes    Types: Marijuana     Allergies   Amoxicillin   Review of Systems Review of Systems  Constitutional:  Negative for appetite change, chills and fever.  HENT:  Positive for congestion and sore throat. Negative for ear pain, rhinorrhea, sinus pressure and sinus pain.   Eyes:  Negative for redness and visual disturbance.  Respiratory:  Positive for cough. Negative for chest tightness, shortness of breath and wheezing.   Cardiovascular:  Negative for chest pain and palpitations.  Gastrointestinal:  Negative for abdominal pain, constipation, diarrhea, nausea and vomiting.  Genitourinary:  Negative for dysuria, frequency and urgency.  Musculoskeletal:  Negative for myalgias.  Neurological:  Negative for dizziness, weakness and headaches.  Psychiatric/Behavioral:  Negative for confusion.   All other systems reviewed and are negative.   Physical Exam Triage Vital Signs ED Triage Vitals  Enc Vitals Group     BP 08/13/21 0856 109/82  Pulse Rate 08/13/21 0856 68     Resp 08/13/21 0856 12     Temp 08/13/21 0856 98.2 F (36.8 C)     Temp Source 08/13/21 0856 Oral     SpO2 08/13/21 0856 96 %     Weight --      Height --      Head Circumference --      Peak Flow --      Pain Score 08/13/21 0855 6     Pain Loc --      Pain Edu? --      Excl. in [place]? --    No data found.  Updated Vital Signs BP 109/82 (BP Location: Right Arm)    Pulse 68    Temp 98.2 F (36.8 C) (Oral)    Resp 12    LMP  (LMP Unknown)    SpO2 96%   Visual Acuity Right Eye Distance:   Left Eye Distance:    Bilateral Distance:    Right Eye Near:   Left Eye Near:    Bilateral Near:     Physical Exam Vitals reviewed.  Constitutional:      General: She is not in acute distress.    Appearance: Normal appearance. She is not ill-appearing.  HENT:     Head: Normocephalic and atraumatic.     Right Ear: Tympanic membrane, ear canal and external ear normal. No tenderness. No middle ear effusion. There is no impacted cerumen. Tympanic membrane is not perforated, erythematous, retracted or bulging.     Left Ear: Tympanic membrane, ear canal and external ear normal. No tenderness.  No middle ear effusion. There is no impacted cerumen. Tympanic membrane is not perforated, erythematous, retracted or bulging.     Nose: Nose normal. No congestion.     Mouth/Throat:     Mouth: Mucous membranes are moist.     Pharynx: Uvula midline. Posterior oropharyngeal erythema present. No oropharyngeal exudate.     Tonsils: Tonsillar abscess present. No tonsillar exudate. 2+ on the right. 2+ on the left.     Comments: Tonsils are 2+ bilaterally without exudate. On exam, uvula is midline, she is tolerating her secretions without difficulty, there is no trismus, no drooling, she has normal phonation. Airway is patent. Eyes:     Extraocular Movements: Extraocular movements intact.     Pupils: Pupils are equal, round, and reactive to light.  Cardiovascular:     Rate and Rhythm: Normal rate and regular rhythm.     Heart sounds: Normal heart sounds.  Pulmonary:     Effort: Pulmonary effort is normal.     Breath sounds: Normal breath sounds. No decreased breath sounds, wheezing, rhonchi or rales.  Abdominal:     Palpations: Abdomen is soft.     Tenderness: There is no abdominal tenderness. There is no guarding or rebound.  Lymphadenopathy:     Cervical: No cervical adenopathy.     Right cervical: No superficial cervical adenopathy.    Left cervical: No superficial cervical adenopathy.  Neurological:     General: No  focal deficit present.     Mental Status: She is alert and oriented to person, place, and time.  Psychiatric:        Mood and Affect: Mood normal.        Behavior: Behavior normal.        Thought Content: Thought content normal.        Judgment: Judgment normal.     UC Treatments / Results  Labs (all labs ordered are listed, but only abnormal results are displayed) Labs Reviewed  POCT INFECTIOUS MONO SCREEN, ED / UC    EKG   Radiology No results found.  Procedures Procedures (including critical care time)  Medications Ordered in UC Medications - No data to display  Initial Impression / Assessment and Plan / UC Course  I have reviewed the triage vital signs and the nursing notes.  Pertinent labs & imaging results that were available during my care of the patient were reviewed by me and considered in my medical decision making (see chart for details).     This patient is a very pleasant 31 y.o. year old female presenting with tonsillitis - concern for tonsillar abscess. Afebrile, nontachy. Airway is patent. Injection contraception. Tonsillitis was treated with azithromycin 12/29 given penicillin allergy, minimal improvement on this.  Rapid strep negative 08/08/21, culture positive for rare strep B.  Covid negative 12/29. Influenza negative 12/29.  Monospot today negative.   Tonsillitis was treated with azithromycin 12/29 given penicillin allergy, minimal improvement on this. Will manage with clindamycin today given concern for tonsillar abscess. No history c dif.  ED return precautions discussed. Patient verbalizes understanding and agreement.   Coding Level 4 for acute complicated illness, and prescription drug management  Final Clinical Impressions(s) / UC Diagnoses   Final diagnoses:  Acute tonsillitis, unspecified etiology     Discharge Instructions      -Clindamycin 4x daily x10 days. Take with food.  -Prednisone, 2 pills taken at the same time for 5  days in a row.  Try taking this earlier in the day as it can give you energy. Avoid NSAIDs like ibuprofen and alleve while taking this medication as they can increase your risk of stomach upset and even GI bleeding when in combination with a steroid. You can continue tylenol (acetaminophen) up to 1000mg  3x daily. -Follow-up with [place] or ED if symptoms worsen - trouble swallowing, shortness of breath, fevers, etc.    ED Prescriptions     Medication Sig Dispense Auth. Provider   clindamycin (CLEOCIN) 300 MG capsule Take 1 capsule (300 mg total) by mouth every 6 (six) hours for 10 days. 40 capsule Hazel Sams, PA-C   predniSONE (DELTASONE) 20 MG tablet Take 2 tablets (40 mg total) by mouth daily for 5 days. Take with breakfast or lunch. Avoid NSAIDs (ibuprofen, etc) while taking this medication. 10 tablet Hazel Sams, PA-C      PDMP not reviewed this encounter.   Hazel Sams, PA-C 08/13/21 0945

## 2021-08-13 NOTE — ED Triage Notes (Signed)
Patient c/o sore throat, productive cough, and nasal congestion x 9 days.   Patient denies fever. Patient denies history of mono.   Patient endorses fatigue. Patient endorses difficulty swallowing.   Patient endorses being diagnosed with tonsillitis " last week" at this clinic.   Patient has completed a course of antibiotic with no relief of symptoms.

## 2021-08-13 NOTE — Discharge Instructions (Addendum)
-  Clindamycin 4x daily x10 days. Take with food.  -Prednisone, 2 pills taken at the same time for 5 days in a row.  Try taking this earlier in the day as it can give you energy. Avoid NSAIDs like ibuprofen and alleve while taking this medication as they can increase your risk of stomach upset and even GI bleeding when in combination with a steroid. You can continue tylenol (acetaminophen) up to 1000mg  3x daily. -Follow-up with or ED if symptoms worsen - trouble swallowing, shortness of breath, fevers, etc.

## 2021-11-06 ENCOUNTER — Ambulatory Visit (HOSPITAL_COMMUNITY)
Admission: EM | Admit: 2021-11-06 | Discharge: 2021-11-07 | Disposition: A | Discharge status: Other Acute Inpatient Hospital | Payer: No Payment, Other | Attending: Psychiatry | Admitting: Psychiatry

## 2021-11-06 DIAGNOSIS — F1290 Cannabis use, unspecified, uncomplicated: Secondary | ICD-10-CM | POA: Insufficient documentation

## 2021-11-06 DIAGNOSIS — Z20822 Contact with and (suspected) exposure to covid-19: Secondary | ICD-10-CM | POA: Insufficient documentation

## 2021-11-06 DIAGNOSIS — F259 Schizoaffective disorder, unspecified: Secondary | ICD-10-CM | POA: Insufficient documentation

## 2021-11-06 DIAGNOSIS — F1090 Alcohol use, unspecified, uncomplicated: Secondary | ICD-10-CM | POA: Insufficient documentation

## 2021-11-06 DIAGNOSIS — F17210 Nicotine dependence, cigarettes, uncomplicated: Secondary | ICD-10-CM | POA: Insufficient documentation

## 2021-11-06 LAB — URINALYSIS, COMPLETE (UACMP) WITH MICROSCOPIC
Bilirubin Urine: NEGATIVE
Glucose, UA: NEGATIVE mg/dL
Ketones, ur: NEGATIVE mg/dL
Leukocytes,Ua: NEGATIVE
Nitrite: NEGATIVE
Protein, ur: NEGATIVE mg/dL
RBC / HPF: NONE SEEN RBC/hpf (ref 0–5)
Specific Gravity, Urine: <1.005 — ABNORMAL LOW (ref 1.005–1.030)
WBC, UA: NONE SEEN WBC/hpf (ref 0–5)
pH: 6 (ref 5.0–8.0)

## 2021-11-06 LAB — ETHANOL: Alcohol, Ethyl (B): <10 mg/dL (ref <10)

## 2021-11-06 LAB — RESP PANEL BY RT-PCR (FLU A&B, COVID) ARPGX2
Influenza A by PCR: NEGATIVE
Influenza B by PCR: NEGATIVE
SARS Coronavirus 2 by RT PCR: NEGATIVE

## 2021-11-06 LAB — CBC WITH DIFFERENTIAL/PLATELET
Abs Immature Granulocytes: 0.03 10*3/uL (ref 0.00–0.07)
Basophils Absolute: 0 10*3/uL (ref 0.0–0.1)
Basophils Relative: 0 %
Eosinophils Absolute: 0 10*3/uL (ref 0.0–0.5)
Eosinophils Relative: 0 %
HCT: 39.4 % (ref 36.0–46.0)
Hemoglobin: 14.1 g/dL (ref 12.0–15.0)
Immature Granulocytes: 0 %
Lymphocytes Relative: 26 %
Lymphs Abs: 2.7 10*3/uL (ref 0.7–4.0)
MCH: 29.4 pg (ref 26.0–34.0)
MCHC: 35.8 g/dL (ref 30.0–36.0)
MCV: 82.1 fL (ref 80.0–100.0)
Monocytes Absolute: 0.6 10*3/uL (ref 0.1–1.0)
Monocytes Relative: 5 %
Neutro Abs: 6.9 10*3/uL (ref 1.7–7.7)
Neutrophils Relative %: 69 %
Platelets: 335 10*3/uL (ref 150–400)
RBC: 4.8 MIL/uL (ref 3.87–5.11)
RDW: 13.1 % (ref 11.5–15.5)
WBC: 10.2 10*3/uL (ref 4.0–10.5)
nRBC: 0 % (ref 0.0–0.2)

## 2021-11-06 LAB — COMPREHENSIVE METABOLIC PANEL
ALT: 27 U/L (ref 0–44)
AST: 21 U/L (ref 15–41)
Albumin: 4 g/dL (ref 3.5–5.0)
Alkaline Phosphatase: 64 U/L (ref 38–126)
Anion gap: 8 (ref 5–15)
BUN: 7 mg/dL (ref 6–20)
CO2: 24 mmol/L (ref 22–32)
Calcium: 9.1 mg/dL (ref 8.9–10.3)
Chloride: 105 mmol/L (ref 98–111)
Creatinine, Ser: 1.08 mg/dL — ABNORMAL HIGH (ref 0.44–1.00)
GFR, Estimated: >60 mL/min (ref >60)
Glucose, Bld: 111 mg/dL — ABNORMAL HIGH (ref 70–99)
Potassium: 3.7 mmol/L (ref 3.5–5.1)
Sodium: 137 mmol/L (ref 135–145)
Total Bilirubin: 0.6 mg/dL (ref 0.3–1.2)
Total Protein: 6.9 g/dL (ref 6.5–8.1)

## 2021-11-06 LAB — POCT URINE DRUG SCREEN - MANUAL ENTRY (I-SCREEN)
POC Amphetamine UR: NOT DETECTED
POC Buprenorphine (BUP): NOT DETECTED
POC Cocaine UR: NOT DETECTED
POC Marijuana UR: POSITIVE — AB
POC Methadone UR: NOT DETECTED
POC Methamphetamine UR: NOT DETECTED
POC Morphine: NOT DETECTED
POC Oxazepam (BZO): NOT DETECTED
POC Oxycodone UR: NOT DETECTED
POC Secobarbital (BAR): NOT DETECTED

## 2021-11-06 LAB — TSH: TSH: 1.476 u[IU]/mL (ref 0.350–4.500)

## 2021-11-06 LAB — LIPID PANEL
Cholesterol: 134 mg/dL (ref 0–200)
HDL: 33 mg/dL — ABNORMAL LOW (ref >40)
LDL Cholesterol: 89 mg/dL (ref 0–99)
Total CHOL/HDL Ratio: 4.1 RATIO
Triglycerides: 62 mg/dL (ref <150)
VLDL: 12 mg/dL (ref 0–40)

## 2021-11-06 LAB — MAGNESIUM: Magnesium: 2 mg/dL (ref 1.7–2.4)

## 2021-11-06 LAB — POC SARS CORONAVIRUS 2 AG -  ED: SARS Coronavirus 2 Ag: NEGATIVE

## 2021-11-06 LAB — POC SARS CORONAVIRUS 2 AG: SARSCOV2ONAVIRUS 2 AG: NEGATIVE

## 2021-11-06 LAB — HEMOGLOBIN A1C
Hgb A1c MFr Bld: 5.2 % (ref 4.8–5.6)
Mean Plasma Glucose: 102.54 mg/dL

## 2021-11-06 MED ORDER — MAGNESIUM HYDROXIDE 400 MG/5ML PO SUSP: 30.0000 mL | Freq: Every day | ORAL | Status: DC | PRN

## 2021-11-06 MED ORDER — OLANZAPINE 10 MG IM SOLR: 10.0000 mg | Freq: Once | INTRAMUSCULAR | Status: AC

## 2021-11-06 MED ORDER — ALUM & MAG HYDROXIDE-SIMETH 200-200-20 MG/5ML PO SUSP: 30.0000 mL | ORAL | Status: DC | PRN

## 2021-11-06 MED ORDER — ACETAMINOPHEN 325 MG PO TABS
650.0000 mg | ORAL_TABLET | Freq: Four times a day (QID) | ORAL | Status: DC | PRN
  Administered 2021-11-06 – 2021-11-07 (×2): 650 mg via ORAL
  Filled 2021-11-06 (×2): qty 2

## 2021-11-06 MED ORDER — NICOTINE POLACRILEX 2 MG MT GUM
CHEWING_GUM | OROMUCOSAL | Status: AC
  Administered 2021-11-06: 2 mg
  Filled 2021-11-06: qty 1

## 2021-11-06 MED ORDER — OLANZAPINE 10 MG PO TBDP
10.0000 mg | ORAL_TABLET | Freq: Once | ORAL | Status: AC
  Administered 2021-11-06: 10 mg via ORAL
  Filled 2021-11-06 (×2): qty 1

## 2021-11-06 MED ORDER — TRAZODONE HCL 50 MG PO TABS: 50.0000 mg | ORAL_TABLET | Freq: Every evening | ORAL | Status: DC | PRN

## 2021-11-06 MED ORDER — NICOTINE POLACRILEX 2 MG MT GUM
2.0000 mg | CHEWING_GUM | OROMUCOSAL | Status: DC | PRN
  Filled 2021-11-06: qty 1

## 2021-11-06 MED ORDER — HYDROXYZINE HCL 25 MG PO TABS
25.0000 mg | ORAL_TABLET | Freq: Three times a day (TID) | ORAL | Status: DC | PRN
  Administered 2021-11-07: 25 mg via ORAL
  Filled 2021-11-06: qty 1
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
## 2021-11-06 NOTE — ED Notes (Signed)
Pt asleep in bed. Respirations even and unlabored. Will continue to monitor for safety. ?
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
## 2021-11-06 NOTE — ED Notes (Signed)
Pt given meal tray and juice. 

## 2021-11-06 NOTE — BH Assessment (Signed)
Marcha Dutton, Urgent: 30 year old presents voluntarily to [facility],  via [employer] and unaccompanied.  Pt denies SI, HI or AVH.  Pt reports experiencing episode of schizophrenia and needed to talk to someone. Pt admits to prior MH diagnosis; also reports have not been taken medication for symptom management.  MSE signed by patient. ?

## 2021-11-07 ENCOUNTER — Other Ambulatory Visit: Payer: Self-pay

## 2021-11-07 ENCOUNTER — Encounter (HOSPITAL_COMMUNITY): Payer: Self-pay | Admitting: Behavioral Health

## 2021-11-07 ENCOUNTER — Encounter (HOSPITAL_COMMUNITY): Payer: Self-pay | Admitting: Psychiatry

## 2021-11-07 ENCOUNTER — Inpatient Hospital Stay (HOSPITAL_COMMUNITY)
Admission: AD | Admit: 2021-11-07 | Discharge: 2021-11-11 | DRG: 885 | Disposition: A | Discharge status: Home / Self Care | Payer: Federal, State, Local not specified - Other | Source: Intra-hospital | Attending: Psychiatry | Admitting: Psychiatry

## 2021-11-07 DIAGNOSIS — F250 Schizoaffective disorder, bipolar type: Secondary | ICD-10-CM | POA: Diagnosis present

## 2021-11-07 DIAGNOSIS — Z8249 Family history of ischemic heart disease and other diseases of the circulatory system: Secondary | ICD-10-CM | POA: Diagnosis not present

## 2021-11-07 DIAGNOSIS — F410 Panic disorder [episodic paroxysmal anxiety] without agoraphobia: Secondary | ICD-10-CM | POA: Diagnosis present

## 2021-11-07 DIAGNOSIS — R61 Generalized hyperhidrosis: Secondary | ICD-10-CM | POA: Diagnosis present

## 2021-11-07 DIAGNOSIS — Z20822 Contact with and (suspected) exposure to covid-19: Secondary | ICD-10-CM | POA: Diagnosis present

## 2021-11-07 DIAGNOSIS — I4891 Unspecified atrial fibrillation: Secondary | ICD-10-CM | POA: Diagnosis present

## 2021-11-07 DIAGNOSIS — F17210 Nicotine dependence, cigarettes, uncomplicated: Secondary | ICD-10-CM | POA: Diagnosis present

## 2021-11-07 DIAGNOSIS — F4310 Post-traumatic stress disorder, unspecified: Secondary | ICD-10-CM | POA: Diagnosis present

## 2021-11-07 DIAGNOSIS — F609 Personality disorder, unspecified: Secondary | ICD-10-CM | POA: Diagnosis present

## 2021-11-07 DIAGNOSIS — F419 Anxiety disorder, unspecified: Secondary | ICD-10-CM | POA: Diagnosis present

## 2021-11-07 DIAGNOSIS — R4587 Impulsiveness: Secondary | ICD-10-CM | POA: Diagnosis present

## 2021-11-07 DIAGNOSIS — F1220 Cannabis dependence, uncomplicated: Secondary | ICD-10-CM | POA: Diagnosis present

## 2021-11-07 DIAGNOSIS — G4700 Insomnia, unspecified: Secondary | ICD-10-CM | POA: Diagnosis present

## 2021-11-07 DIAGNOSIS — Z9152 Personal history of nonsuicidal self-harm: Secondary | ICD-10-CM

## 2021-11-07 DIAGNOSIS — Z91410 Personal history of adult physical and sexual abuse: Secondary | ICD-10-CM | POA: Diagnosis not present

## 2021-11-07 DIAGNOSIS — Z880 Allergy status to penicillin: Secondary | ICD-10-CM | POA: Diagnosis not present

## 2021-11-07 DIAGNOSIS — J45909 Unspecified asthma, uncomplicated: Secondary | ICD-10-CM | POA: Diagnosis present

## 2021-11-07 DIAGNOSIS — F259 Schizoaffective disorder, unspecified: Principal | ICD-10-CM | POA: Diagnosis present

## 2021-11-07 LAB — RPR: RPR Ser Ql: NONREACTIVE

## 2021-11-07 MED ORDER — TRAZODONE HCL 50 MG PO TABS
50.0000 mg | ORAL_TABLET | Freq: Every evening | ORAL | Status: DC | PRN
  Filled 2021-11-07: qty 1

## 2021-11-07 MED ORDER — NICOTINE POLACRILEX 2 MG MT GUM
2.0000 mg | CHEWING_GUM | OROMUCOSAL | Status: DC | PRN
  Administered 2021-11-07 – 2021-11-11 (×8): 2 mg via ORAL
  Filled 2021-11-07 (×8): qty 1

## 2021-11-07 MED ORDER — WHITE PETROLATUM EX OINT
TOPICAL_OINTMENT | CUTANEOUS | Status: AC
  Filled 2021-11-07: qty 5

## 2021-11-07 MED ORDER — OLANZAPINE 5 MG PO TBDP
5.0000 mg | ORAL_TABLET | Freq: Three times a day (TID) | ORAL | Status: DC | PRN
Start: 2021-11-07 — End: 2021-11-11
  Administered 2021-11-09: 5 mg via ORAL
  Filled 2021-11-07: qty 1

## 2021-11-07 MED ORDER — OLANZAPINE 5 MG PO TABS
5.0000 mg | ORAL_TABLET | Freq: Two times a day (BID) | ORAL | Status: DC
  Administered 2021-11-08: 5 mg via ORAL
  Filled 2021-11-07 (×6): qty 1

## 2021-11-07 MED ORDER — HYDROXYZINE HCL 25 MG PO TABS
25.0000 mg | ORAL_TABLET | Freq: Three times a day (TID) | ORAL | Status: DC | PRN
  Administered 2021-11-08: 25 mg via ORAL
  Filled 2021-11-07 (×2): qty 1

## 2021-11-07 MED ORDER — LORAZEPAM 1 MG PO TABS
1.0000 mg | ORAL_TABLET | Freq: Three times a day (TID) | ORAL | Status: DC | PRN
  Filled 2021-11-07: qty 1

## 2021-11-07 MED ORDER — ACETAMINOPHEN 325 MG PO TABS
650.0000 mg | ORAL_TABLET | Freq: Four times a day (QID) | ORAL | Status: DC | PRN
  Administered 2021-11-07: 325 mg via ORAL
  Administered 2021-11-08 – 2021-11-11 (×4): 650 mg via ORAL
  Filled 2021-11-07 (×4): qty 2
  Filled 2021-11-07: qty 1

## 2021-11-07 MED ORDER — ALUM & MAG HYDROXIDE-SIMETH 200-200-20 MG/5ML PO SUSP: 30.0000 mL | ORAL | Status: DC | PRN

## 2021-11-07 MED ORDER — ZIPRASIDONE MESYLATE 20 MG IM SOLR: 20.0000 mg | Freq: Three times a day (TID) | INTRAMUSCULAR | Status: DC | PRN

## 2021-11-07 MED ORDER — MAGNESIUM HYDROXIDE 400 MG/5ML PO SUSP: 30.0000 mL | Freq: Every day | ORAL | Status: DC | PRN

## 2021-11-07 NOTE — Progress Notes (Signed)
Report given to Will, RN @[facility] 

## 2021-11-07 NOTE — ED Notes (Signed)
Pt asleep in bed. Respirations even and unlabored. Will continue to monitor for safety. ?

## 2021-11-07 NOTE — ED Notes (Signed)
Sandwich and juice given per pt request.
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
## 2021-11-07 NOTE — Tx Team (Signed)
Initial Treatment Plan ?11/07/2021 ?5:11 PM ?[name] ?[id number] ? ? ? ?PATIENT STRESSORS: ?Financial difficulties   ?Legal issue   ?Marital or family conflict   ?Substance abuse   ? ? ?PATIENT STRENGTHS: ?Ability for insight  ?Average or above average intelligence  ?Religious Affiliation  ?Work skills  ? ? ?PATIENT IDENTIFIED PROBLEMS: ?Find medication management  ?"Find therapist for me and my mom to go to"  ?Cannabis use  ?  ?  ?  ?  ?  ?  ?  ? ?DISCHARGE CRITERIA:  ?Ability to meet basic life and health needs ?Improved stabilization in mood, thinking, and/or behavior ?Reduction of life-threatening or endangering symptoms to within safe limits ? ?PRELIMINARY DISCHARGE PLAN: ?Outpatient therapy ? ?PATIENT/FAMILY INVOLVEMENT: ?This treatment plan has been presented to and reviewed with the patient, Kelly Crosby The patient has been given the opportunity to ask questions and make suggestions. ? ?Garnette Scheuermann, RN ?11/07/2021, 5:11 PM ?
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
## 2021-11-07 NOTE — Progress Notes (Signed)
Pt is awake, alert and oriented. Pt did not voice any complaints of pain or discomfort. No signs of acute distress noted. Pt denies current SI/HI/AVH. Staff will monitor for pt's safety. 

## 2021-11-07 NOTE — ED Notes (Signed)
Kelly Crosby transferred to [facility] per NP order. Discussed with the patient and all questions fully answered. An EMTALA and Med Necessity forms were printed and to be given to the receiving nurse. Patient escorted out and transferred via [employer]. ?Kelly Crosby  Kelly Crosby  ?11/07/2021 2:08 PM ?  ?   ?

## 2021-11-08 ENCOUNTER — Encounter (HOSPITAL_COMMUNITY): Payer: Self-pay

## 2021-11-08 MED ORDER — DIVALPROEX SODIUM ER 500 MG PO TB24
500.0000 mg | ORAL_TABLET | Freq: Every day | ORAL | Status: DC
  Filled 2021-11-08 (×3): qty 1

## 2021-11-08 MED ORDER — DIVALPROEX SODIUM 500 MG PO DR TAB
500.0000 mg | DELAYED_RELEASE_TABLET | Freq: Two times a day (BID) | ORAL | Status: DC
  Administered 2021-11-08: 500 mg via ORAL
  Filled 2021-11-08 (×3): qty 1

## 2021-11-08 MED ORDER — OLANZAPINE 5 MG PO TABS
5.0000 mg | ORAL_TABLET | Freq: Two times a day (BID) | ORAL | Status: DC
Start: 2021-11-08 — End: 2021-11-11
  Administered 2021-11-08 – 2021-11-11 (×6): 5 mg via ORAL
  Filled 2021-11-08 (×8): qty 1

## 2021-11-08 MED ORDER — LOPERAMIDE HCL 2 MG PO CAPS: 2.0000 mg | ORAL_CAPSULE | ORAL | Status: DC | PRN

## 2021-11-08 MED ORDER — ONDANSETRON 4 MG PO TBDP
4.0000 mg | ORAL_TABLET | Freq: Four times a day (QID) | ORAL | Status: DC | PRN
  Administered 2021-11-09: 4 mg via ORAL
  Filled 2021-11-08: qty 1

## 2021-11-08 MED ORDER — ALBUTEROL SULFATE HFA 108 (90 BASE) MCG/ACT IN AERS
1.0000 | INHALATION_SPRAY | RESPIRATORY_TRACT | Status: DC | PRN
  Administered 2021-11-09 – 2021-11-11 (×4): 1 via RESPIRATORY_TRACT
  Filled 2021-11-08: qty 6.7

## 2021-11-08 MED ORDER — ADULT MULTIVITAMIN W/MINERALS CH
1.0000 | ORAL_TABLET | Freq: Every day | ORAL | Status: DC
  Administered 2021-11-08 – 2021-11-11 (×4): 1 via ORAL
  Filled 2021-11-08 (×6): qty 1

## 2021-11-08 MED ORDER — HYDROXYZINE HCL 25 MG PO TABS
25.0000 mg | ORAL_TABLET | Freq: Four times a day (QID) | ORAL | Status: DC | PRN
  Administered 2021-11-10: 25 mg via ORAL
  Filled 2021-11-08: qty 1

## 2021-11-08 MED ORDER — LORAZEPAM 1 MG PO TABS
1.0000 mg | ORAL_TABLET | Freq: Four times a day (QID) | ORAL | Status: DC | PRN
  Administered 2021-11-09: 1 mg via ORAL

## 2021-11-08 MED ORDER — THIAMINE HCL 100 MG PO TABS
100.0000 mg | ORAL_TABLET | Freq: Every day | ORAL | Status: DC
  Administered 2021-11-09 – 2021-11-11 (×3): 100 mg via ORAL
  Filled 2021-11-08 (×4): qty 1

## 2021-11-08 MED ORDER — THIAMINE HCL 100 MG/ML IJ SOLN: 100.0000 mg | Freq: Once | INTRAMUSCULAR | Status: DC

## 2021-11-08 NOTE — BH IP Treatment Plan (Signed)
Interdisciplinary Treatment and Diagnostic Plan Update ? ?11/08/2021 ?Time of Session: 9:45am ?Kelly Crosby ?MRN: [id number] ? ?Principal Diagnosis: Schizoaffective disorder ([place]) ? ?Secondary Diagnoses: Principal Problem: ?  Schizoaffective disorder ([place]) ? ? ?Current Medications:  ?Current Facility-Administered Medications  ?Medication Dose Route Frequency Provider Last Rate Last Admin  ? acetaminophen (TYLENOL) tablet 650 mg  650 mg Oral Q6H PRN Tharon Aquas, NP   650 mg at 11/08/21 [id number]  ? alum & mag hydroxide-simeth (MAALOX/MYLANTA) 200-200-20 MG/5ML suspension 30 mL  30 mL Oral Q4H PRN Tharon Aquas, NP      ? hydrOXYzine (ATARAX) tablet 25 mg  25 mg Oral TID PRN Tharon Aquas, NP   25 mg at 11/08/21 0945  ? OLANZapine zydis (ZYPREXA) disintegrating tablet 5 mg  5 mg Oral Q8H PRN Massengill, Ovid Curd, MD      ? And  ? LORazepam (ATIVAN) tablet 1 mg  1 mg Oral Q8H PRN Massengill, Nathan, MD      ? And  ? ziprasidone (GEODON) injection 20 mg  20 mg Intramuscular Q8H PRN Massengill, Nathan, MD      ? magnesium hydroxide (MILK OF MAGNESIA) suspension 30 mL  30 mL Oral Daily PRN Tharon Aquas, NP      ? nicotine polacrilex (NICORETTE) gum 2 mg  2 mg Oral Q4H PRN Tharon Aquas, NP   2 mg at 11/07/21 1811  ? OLANZapine (ZYPREXA) tablet 5 mg  5 mg Oral BID Tharon Aquas, NP   5 mg at 11/08/21 [id number]  ? traZODone (DESYREL) tablet 50 mg  50 mg Oral QHS PRN Tharon Aquas, NP      ? ?PTA Medications: ?No medications prior to admission.  ? ? ?Patient Stressors: Financial difficulties   ?Legal issue   ?Marital or family conflict   ?Substance abuse   ? ?Patient Strengths: Ability for insight  ?Average or above average intelligence  ?Religious Affiliation  ?Work skills  ? ?Treatment Modalities: Medication Management, Group therapy, Case management,  ?1 to 1 session with clinician, Psychoeducation, Recreational therapy. ? ? ?Physician Treatment Plan for Primary Diagnosis: Schizoaffective  disorder ([place]) ?Long Term Goal(s):    ? ?Short Term Goals:   ? ?Medication Management: Evaluate patient's response, side effects, and tolerance of medication regimen. ? ?Therapeutic Interventions: 1 to 1 sessions, Unit Group sessions and Medication administration. ? ?Evaluation of Outcomes: Progressing ? ?Physician Treatment Plan for Secondary Diagnosis: Principal Problem: ?  Schizoaffective disorder ([place]) ? ?Long Term Goal(s):    ? ?Short Term Goals:      ? ?Medication Management: Evaluate patient's response, side effects, and tolerance of medication regimen. ? ?Therapeutic Interventions: 1 to 1 sessions, Unit Group sessions and Medication administration. ? ?Evaluation of Outcomes: Progressing ? ? ?RN Treatment Plan for Primary Diagnosis: Schizoaffective disorder ([place]) ?Long Term Goal(s): Knowledge of disease and therapeutic regimen to maintain health will improve ? ?Short Term Goals: Ability to remain free from injury will improve, Ability to verbalize frustration and anger appropriately will improve, Ability to demonstrate self-control, Ability to participate in decision making will improve, Ability to verbalize feelings will improve, Ability to disclose and discuss suicidal ideas, Ability to identify and develop effective coping behaviors will improve, and Compliance with prescribed medications will improve ? ?Medication Management: RN will administer medications as ordered by provider, will assess and evaluate patient's response and provide education to patient for prescribed medication. RN will report any adverse and/or side effects to prescribing provider. ? ?Therapeutic Interventions: 1  on 1 counseling sessions, Psychoeducation, Medication administration, Evaluate responses to treatment, Monitor vital signs and CBGs as ordered, Perform/monitor CIWA, COWS, AIMS and Fall Risk screenings as ordered, Perform wound care treatments as ordered. ? ?Evaluation of Outcomes: Progressing ? ? ?LCSW Treatment Plan for  Primary Diagnosis: Schizoaffective disorder ([place]) ?Long Term Goal(s): Safe transition to appropriate next level of care at discharge, Engage patient in therapeutic group addressing interpersonal concerns. ? ?Short Term Goals: Engage patient in aftercare planning with referrals and resources, Increase social support, Increase ability to appropriately verbalize feelings, Increase emotional regulation, Facilitate acceptance of mental health diagnosis and concerns, Facilitate patient progression through stages of change regarding substance use diagnoses and concerns, Identify triggers associated with mental health/substance abuse issues, and Increase skills for wellness and recovery ? ?Therapeutic Interventions: Assess for all discharge needs, 1 to 1 time with Education officer, museum, Explore available resources and support systems, Assess for adequacy in community support network, Educate family and significant other(s) on suicide prevention, Complete Psychosocial Assessment, Interpersonal group therapy. ? ?Evaluation of Outcomes: Progressing ? ? ?Progress in Treatment: ?Attending groups: No. ?Participating in groups: No. Due to sickness on the unit, groups have not been held ?Taking medication as prescribed: Yes. ?Toleration medication: Yes. ?Family/Significant other contact made: No, will contact:  CSW will assess and identify support ?Patient understands diagnosis: Yes. ?Discussing patient identified problems/goals with staff: Yes. ?Medical problems stabilized or resolved: Yes. ?Denies suicidal/homicidal ideation: Yes. ?Issues/concerns per patient self-inventory: No. ?Other: none ? ?New problem(s) identified: No, Describe:  none reported ? ?New Short Term/Long Term Goal(s):  ? ?medication stabilization, elimination of SI thoughts, development of comprehensive mental wellness plan.  ? ? ?Patient Goals:  Patient states that she would like to be discharged as soon as possible and get on a medication plan.  ? ?Discharge Plan or  Barriers: Patient recently admitted. CSW will continue to follow and assess for appropriate referrals and possible discharge planning.  ? ? ?Reason for Continuation of Hospitalization: Anxiety ?Mania ?Medication stabilization ? ?Estimated Length of Stay: 3-5 days ? ? ?Scribe for Treatment Team: ?Zachery Conch, LCSW ?11/08/2021 ?11:02 AM ?

## 2021-11-08 NOTE — [facility] Group Notes (Signed)
Spirituality group facilitated by Chaplain Katy Alasdair Kleve, BCC.   Group Description: Group focused on topic of hope. Patients participated in facilitated discussion around topic, connecting with one another around experiences and definitions for hope. Group members engaged with visual explorer photos, reflecting on what hope looks like for them today. Group engaged in discussion around how their definitions of hope are present today in hospital.   Modalities: Psycho-social ed, Adlerian, Narrative, MI   Patient Progress: Did not attend.  

## 2021-11-08 NOTE — Group Note (Signed)
LCSW Group Therapy Note ? ? ?Group Date: 11/08/2021 ?Start Time: 1100 ?End Time: 1200 ? ? ?Type of Therapy and Topic:  Group Therapy: Problem Solving ? ?Participation Level:  Active ? ? ?Summary of Patient Progress: Due acuity on the unit group was not held. Patient was provided therapeutic worksheets and asked to meet with CSW to discuss those worksheets as needed.  ? ? ? ?Tymir Terral A Kasir Hallenbeck, LCSW ?11/08/2021  2:25 PM   ? ?

## 2021-11-08 NOTE — Progress Notes (Signed)
?   11/08/21 0800  ?Psych Admission Type (Psych Patients Only)  ?Admission Status Involuntary  ?Psychosocial Assessment  ?Patient Complaints Anxiety  ?Eye Contact Fair  ?Facial Expression Flat  ?Affect Appropriate to circumstance  ?Speech Logical/coherent  ?Interaction Forwards little  ?Motor Activity Slow  ?Appearance/Hygiene Meticulous  ?Behavior Characteristics Cooperative  ?Mood Depressed  ?Thought Process  ?Coherency WDL  ?Content WDL  ?Delusions None reported or observed  ?Perception WDL  ?Hallucination None reported or observed  ?Judgment WDL  ?Confusion None  ?Danger to Self  ?Current suicidal ideation? Denies  ?Danger to Others  ?Danger to Others None reported or observed  ?Danger to Others Abnormal  ?Harmful Behavior to others No threats or harm toward other people  ? ?D: Pt. Denies SI/HI/AVH. Pt. Rated anxiety 2/10 and depression 0/10. Pt. Has been isolative in her room. Pt. Has been calm and cooperative for most of this shift.  ?A:Pt. Took all scheduled medications. ?R: Safety maintained.  ?

## 2021-11-08 NOTE — Progress Notes (Signed)
Recreation Therapy Notes ? ?INPATIENT RECREATION THERAPY ASSESSMENT ? ?Patient Details ?Name: Kelly Crosby ?MRN: [id number] ?DOB: 09-22-1990 ?Today's Date: 11/08/2021 ?      ?Information Obtained From: ?Patient ? ?Able to Participate in Assessment/Interview: ?Yes ? ?Patient Presentation: ?Alert ? ?Reason for Admission (Per Patient): ?Other (Comments) ("went through a manic episode") ? ?Patient Stressors: ?Other (Comment) ("people and money") ? ?Coping Skills:   ?Isolation, Journal, TV, Sports, Music, Exercise, Meditate, Deep Breathing, Substance Abuse, Talk, Art, Prayer, Avoidance, Read, Dance, Hot Bath/Shower ? ?Leisure Interests (2+):  ?Social - Family, Technical brewer - Other (Comment), Individual - Other (Comment) (Walking dog, spending time with daughter, being at the pool or lake) ? ?Frequency of Recreation/Participation: ?Other (Comment) (Daily) ? ?Awareness of Community Resources:  ?Yes ? ?Community Resources:  ?Library, [employer], Public affairs consultant ? ?Current Use: ?Yes ? ?If no, Barriers?: ?  ? ?Expressed Interest in [employer] Information: ?No ? ?[place] of Residence:  ?[place] ? ?Patient Main Form of Transportation: ?Uber/Lyft ? ?Patient Strengths:  ?Organization, Time Management, Communication ? ?Patient Identified Areas of Improvement:  ?Money Management ? ?Patient Goal for Hospitalization:  ?"follow orders, develop strong discharge plan and medications to improve mood" ? ?Current SI (including self-harm):  ?No ? ?Current HI:  ?No ? ?Current AVH: ?No ? ?Staff Intervention Plan: ?Group Attendance, Collaborate with Interdisciplinary Treatment Team ? ?Consent to Intern Participation: ?N/A ? ? ?Caroll Rancher, LRT,CTRS ?Caroll Rancher A ?11/08/2021, 11:27 AM ?
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
## 2021-11-08 NOTE — Progress Notes (Signed)
Child/Adolescent Psychoeducational Group Note ? ?Date:  11/08/2021 ?Time:  8:31 PM ? ?Group Topic/Focus:  Wrap-Up Group:   The focus of this group is to help patients review their daily goal of treatment and discuss progress on daily workbooks. ? ?Participation Level:  Active ? ?Participation Quality:  Appropriate and Attentive ? ?Affect:  Appropriate ? ?Cognitive:  Appropriate ? ?Insight:  Appropriate ? ?Engagement in Group:  Engaged ? ?Modes of Intervention:  Discussion ? ?Additional Comments:   ?Pt shared that one of her goals today was to focus on treatment and continue her correct medication after discharge. Pt states that she hasn't been in touch with any friends or family and was encourages to talk to her social worker about this issue. ? ?Gerhard Perches ?11/08/2021, 8:31 PM ?
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
## 2021-11-08 NOTE — BHH Suicide Risk Assessment (Addendum)
Suicide Risk Assessment ? ?Admission Assessment    ?[facility] Admission Suicide Risk Assessment ? ? ?Nursing information obtained from:  Patient ?Demographic factors:  Kelly Crosby, lesbian, or bisexual orientation, Low socioeconomic status, Unemployed ?Current Mental Status:  NA ?Loss Factors:  Legal issues, Financial problems / change in socioeconomic status ?Historical Factors:  Prior suicide attempts, Impulsivity ?Risk Reduction Factors:  Responsible for children under 32 years of age, Living with another person, especially a relative ? ?Total Time spent with patient: 1.5 hours ?Principal Problem: Schizoaffective disorder ([place]) ?Diagnosis:  Principal Problem: ?  Schizoaffective disorder ([place]) ? ?Subjective Data: Kelly Crosby is a 31 yo patient w/ PPH of schizoaffective disorder, bipolar type and cannabis use who presented to [facility] as a transfer from [facility] after presenting with IVC endorsing significant decrease in sleep, bizarre behavior, and getting into altercations requiring police presence. 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 Patient reports that she feels she must leave the hospital because she is losing out on deals and she needs to make money. Patient endorses that she does not frequently work in a team setting and when she does she uses the phrase "my team..." during this assessment. Patient appear a bit grandiose as she also endorses that she has no fears associated with others talking about her in public, patient reports " I like that because it means I'm worth talking about." Patient reports that she also  attempted in 2014 by OD. Patient denies SI, HI and Vh on assessment today. Patient reports she does hear a "gentle female voice, when upset that tells me to calm down." Patient reports that she believes this is God. Patient denies feeling that someone is watching her. Patient judgement and insight have improved from presentation to [facility]; however she believes she should be able to go home but after discussion it was explained she will need monitoring on meds while be restarted. ?  ? ?Continued Clinical Symptoms:  ?Alcohol Use Disorder Identification Test Final Score (AUDIT): 4 ?The "Alcohol Use Disorders Identification Test", Guidelines for Use in Primary Care, Second Edition.  World Pharmacologist [facility]). ?Score between 0-7:  no or low risk or alcohol related problems. ?Score between 8-15:  moderate risk of alcohol related problems. ?Score between 16-19:  high risk of alcohol related problems. ?Score 20 or above:  warrants further diagnostic evaluation for alcohol dependence and treatment. ? ? ?CLINICAL FACTORS:  ? Manic behavior and Psychotic ? ? ?Musculoskeletal: ?Strength & Muscle Tone: within normal limits ?Gait & Station: normal ?Patient leans: N/A ? ?Psychiatric Specialty Exam: ? ?Presentation  ?General Appearance: Appropriate for Environment; Casual ? ?Eye Contact:Good ? ?Speech:Clear and Coherent ? ?Speech Volume:Normal ? ?Handedness:No data recorded ? ?Mood and Affect  ?Mood:Anxious ? ?Affect:Appropriate ? ? ?Thought Process  ?Thought Processes:Linear ? ?Descriptions of Associations:Circumstantial ? ?Orientation:Full (Time, Place and Person) ? ?Thought Content:Logical ? ?History of Schizophrenia/Schizoaffective disorder:Yes ? ?Duration of Psychotic Symptoms:Less than six months ? ?Hallucinations:Hallucinations: None ? ?Ideas of Reference:None ? ?Suicidal Thoughts:Suicidal Thoughts: No ? ?Homicidal Thoughts:Homicidal Thoughts: No ? ? ?Sensorium  ?Memory:Immediate Fair; Recent Fair ? ?Judgment:--  (Improving) ? ?Insight:Shallow ? ? ?  Executive Functions  ?Concentration:Good ? ?Attention Span:Good ? ?Recall:Fair ? ?[place] ? ?Language:Good ? ? ?Psychomotor Activity  ?Psychomotor Activity:Psychomotor Activity: Normal ? ? ?Assets  ?Assets:Resilience; Desire for Improvement; Communication Skills ? ? ?Sleep  ?Sleep:Sleep: Fair ? ? ? ?Physical Exam: ?Physical Exam ?HENT:  ?   Head: Normocephalic and atraumatic.  ?Pulmonary:  ?   Effort: Pulmonary effort is normal.  ?Neurological:  ?   Mental Status: She is alert and oriented to person, place, and time.  ? ?Review of Systems  ?Psychiatric/Behavioral:  Positive for hallucinations. Negative for suicidal ideas.   ?Blood pressure (!) 92/51, pulse 72, temperature 98.2 ?F (36.8 ?C), resp. rate 16, height 5' 6.5" (1.689 m), weight 125.4 kg, SpO2 100 %. Body mass index is 43.94 kg/m?. ? ? ?COGNITIVE FEATURES THAT CONTRIBUTE TO RISK:  ?None   ? ?SUICIDE RISK:  ?Mild:  There are no identifiable suicide plans, no associated intent, mild dysphoria and related symptoms, good self-control (both objective and subjective assessment), few other risk factors, and identifiable protective factors, including available and accessible social support. ? ? ?PLAN OF CARE: Admit due to manic behaviors and psychosis. She needs crisis stabilization, safety monitoring and medication management.  ?   ? ?I certify that inpatient services furnished can reasonably be expected to improve the patient's condition.  ? ? ?PGY-2 ?Freida Busman, MD ?11/08/2021, 4:29 PM ? ?Total Time Spent in Direct Patient Care:  ?I personally spent 60 minutes on the unit in direct patient care. The direct patient care time included face-to-face time with the patient, reviewing the patient's chart, communicating with other professionals, and coordinating care. Greater than 50% of this time was spent in counseling or coordinating care with the patient regarding goals of hospitalization, psycho-education, and  discharge planning needs. ? ?I have independently evaluated the patient during a face-to-face assessment on 11/08/21. I reviewed the patient's chart, and I participated in key portions of the service. I discussed the case with the [employer], and I agree with the assessment and plan of care as documented in the House Officer's note, as addended by me or notated below: ? ?I directly edited the [employer]iers, as above. ?See my attestation of the H&P for additional information regarding assessment, diagnosis list, and plan.  ? ? ?Janine Limbo, MD ?Psychiatrist  ?

## 2021-11-08 NOTE — Progress Notes (Signed)
Patient reports that she has been asleep most of the day after she found out that she was not discharging today. She was compliant with her 2000 medications and denies si/hi, auditory and visual hallucinations. She is observed up in the dayroom interacting appropriately with staff and other patients. Support given and safety maintained on unit with 15 min checks. ?

## 2021-11-09 DIAGNOSIS — F419 Anxiety disorder, unspecified: Secondary | ICD-10-CM | POA: Insufficient documentation

## 2021-11-09 DIAGNOSIS — F4310 Post-traumatic stress disorder, unspecified: Secondary | ICD-10-CM

## 2021-11-09 DIAGNOSIS — E669 Obesity, unspecified: Secondary | ICD-10-CM | POA: Insufficient documentation

## 2021-11-09 LAB — PREGNANCY, URINE: Preg Test, Ur: NEGATIVE

## 2021-11-09 MED ORDER — DIVALPROEX SODIUM 500 MG PO DR TAB
500.0000 mg | DELAYED_RELEASE_TABLET | Freq: Two times a day (BID) | ORAL | Status: DC
  Administered 2021-11-09 – 2021-11-10 (×3): 500 mg via ORAL
  Filled 2021-11-09 (×5): qty 1
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
## 2021-11-09 NOTE — [facility] Group Notes (Signed)
Adult Psychoeducational Group Note ? ?Date:  11/09/2021 ?Time:  9:35 AM ? ?Group Topic/Focus:  ?Goals Group:   The focus of this group is to help patients establish daily goals to achieve during treatment and discuss how the patient can incorporate goal setting into their daily lives to aide in recovery. ? ?Participation Level:  Active ? ?Participation Quality:  Attentive ? ?Affect:  Appropriate ? ?Cognitive:  Alert ? ?Insight: Appropriate ? ?Engagement in Group:  Engaged ? ?Modes of Intervention:  Discussion ? ?Additional Comments:  Patient attended and participated in the goals group. ? ?Jearl Klinefelter ?11/09/2021, 9:35 AM ?

## 2021-11-09 NOTE — [facility] Group Notes (Addendum)
Group Date: 11/09/2021 ?Start Time: 11:30am ?End Time: 12:00pm ?  ?  ?Type of Therapy and Topic:  Group Therapy: Anger ?  ?Participation Level:  Active ?  ?Description of Group:   Due to the acuity on the unit, staff recommended no close contact at this time so group was not held.  Patient was provided with written information and worksheets about anger.  CSW went over the information with her briefly, as she stated she is angry right now. ?  ?  ?  ?Ambrose Mantle, LCSW ?11/09/2021 ?

## 2021-11-09 NOTE — Progress Notes (Signed)
Adult Psychoeducational Group Note ? ?Date:  11/09/2021 ?Time:  8:50 PM ? ?Group Topic/Focus:  ?Wrap-Up Group:   The focus of this group is to help patients review their daily goal of treatment and discuss progress on daily workbooks. ? ?Participation Level:  Did Not Attend ? ?Participation Quality:   Did Not Attend ? ?Affect:   Did Not Attend ? ?Cognitive:   Did Not Attend ? ?Insight: None ? ?Engagement in Group:  Did Not Attend ? ?Modes of Intervention:   Did Not Attend ? ?Additional Comments: Pt was encouraged to attend wrap up group but did not attend  ? ?Felipa Furnace ?11/09/2021, 8:50 PM ?

## 2021-11-10 MED ORDER — DIVALPROEX SODIUM 500 MG PO DR TAB
1000.0000 mg | DELAYED_RELEASE_TABLET | Freq: Every day | ORAL | Status: DC
Start: 2021-11-10 — End: 2021-11-11
  Administered 2021-11-10: 1000 mg via ORAL
  Filled 2021-11-10 (×2): qty 2
  Filled 2021-11-10: qty 1

## 2021-11-10 MED ORDER — LORAZEPAM 1 MG PO TABS: 1.0000 mg | ORAL_TABLET | Freq: Once | ORAL | Status: DC

## 2021-11-10 MED ORDER — BENZOCAINE 10 % MT GEL: Freq: Two times a day (BID) | OROMUCOSAL | Status: DC | PRN

## 2021-11-10 MED ORDER — DIVALPROEX SODIUM 500 MG PO DR TAB
500.0000 mg | DELAYED_RELEASE_TABLET | Freq: Every day | ORAL | Status: DC
  Administered 2021-11-11: 500 mg via ORAL
  Filled 2021-11-10 (×3): qty 1

## 2021-11-10 NOTE — Progress Notes (Signed)
[facility] MD Progress Note ? ?11/10/2021 2:24 PM ?Kelly Crosby  ?MRN:  [id number] ?Subjective:  Kelly Crosby is a 31 yo patient w/ PPH of schizoaffective disorder, bipolar type and cannabis use who presented to [facility] as a transfer from [facility] after presenting with IVC endorsing significant decrease in sleep, bizarre behavior, and getting into altercations requiring police presence. While at [facility] patient had and episode of throwing things and was started on Zyprexa. ?  ?Case was discussed in the multidisciplinary team. MAR was reviewed and patient was compliant with medications.  He did not require any PRN's for agitation. ?  ?  ? ?Per RN patient was briefly verbally harassed by hypersexual female who was also on the unit; however patient handled the situation well and staff continue to interject and redirect the other patient. ? ?Psychiatric Team made the following recommendations yesterday:  ?- Continue Zyprexa 5mg  BID ?- Start Depakote DR 500mg  BID ? ? ?On assessment this a.m. patient reports that she is feeling okay.  Patient reports that she slept well last night.  Patient reports that she does feel that her mood is a bit irritable but attributes this to the fact that she has discovered her mother has changed her number.  Patient reports that this makes her anxious as she is not able to contact her daughter.  Patient reports that being in the dark about her daughter's whereabouts is constantly on her mind.  Patient is able to reason out however, that if she were to leave and try and get in contact with her daughter or go to her mother's home her mother would likely put a restraining order against her.  Patient reports that she knows she does not want to have a restraining order placed on her.  Patient reports that she is getting along very well with the other patients on the unit and although she has not been able to get to her locker to get things that she had previously been requesting she has been using the materials  provided to her on the unit.  Patient reports that she has been in contact with her godmother, and her godmother has set the boundaries that patient must continue to behave well in order to be able to discharge to godmother's home.  Patient reports that she wants to make her godmother proud.  Patient denies SI, HI and AVH on assessment today.  Patient was able to endorse that she needs to live with her godmother and save her money for the next 1-2 months in order to get herself in a healthier position if she wants to be able to take care of her daughter. ? ?When patient spoke about her future plans today she appears less grandiose and with less flight of ideas regarding how she will make money. ? ?Principal Problem: Schizoaffective disorder (HCC) ?Diagnosis: Principal Problem: ?  Schizoaffective disorder (HCC) ?Active Problems: ?  Cannabis use disorder, severe, dependence (HCC) ?  Personality disorder (HCC) ?  PTSD (post-traumatic stress disorder) ? ?Total Time spent with patient: 20 minutes ? ?Past Psychiatric History: See H&P ? ?Past Medical History:  ?Past Medical History:  ?Diagnosis Date  ? A-fib (HCC)   ? Anxiety   ? Depression   ? Schizotaxia   ?  ?Past Surgical History:  ?Procedure Laterality Date  ? NO PAST SURGERIES    ? ?Family History:  ?Family History  ?Problem Relation Age of Onset  ? Hypertension Other   ? Hypertension Mother   ? Post-traumatic  stress disorder Father   ? ?Family Psychiatric  History: See H&P ?Social History:  ?Social History  ? ?Substance and Sexual Activity  ?Alcohol Use Yes  ?   ?Social History  ? ?Substance and Sexual Activity  ?Drug Use Yes  ? Types: Marijuana  ?  ?Social History  ? ?Socioeconomic History  ? Marital status: Single  ?  Spouse name: Not on file  ? Number of children: Not on file  ? Years of education: Not on file  ? Highest education level: Not on file  ?Occupational History  ? Not on file  ?Tobacco Use  ? Smoking status: Every Day  ?  Packs/day: 1.00  ?  Types:  Cigarettes, E-cigarettes  ? Smokeless tobacco: Never  ?Vaping Use  ? Vaping Use: Every day  ?Substance and Sexual Activity  ? Alcohol use: Yes  ? Drug use: Yes  ?  Types: Marijuana  ? Sexual activity: Yes  ?  Birth control/protection: Injection  ?Other Topics Concern  ? Not on file  ?Social History Narrative  ? Not on file  ? ?Social Determinants of Health  ? ?Financial Resource Strain: Not on file  ?Food Insecurity: Not on file  ?Transportation Needs: Not on file  ?Physical Activity: Not on file  ?Stress: Not on file  ?Social Connections: Not on file  ? ?Additional Social History:  ?  ?  ?  ?  ?  ?  ?  ?  ?  ?  ?  ? ?Sleep: Good ? ?Appetite:  Good ? ?Current Medications: ?Current Facility-Administered Medications  ?Medication Dose Route Frequency Provider Last Rate Last Admin  ? acetaminophen (TYLENOL) tablet 650 mg  650 mg Oral Q6H PRN Lauree ChandlerLee, Jacqueline Eun, NP   650 mg at 11/10/21 0323  ? albuterol (VENTOLIN HFA) 108 (90 Base) MCG/ACT inhaler 1 puff  1 puff Inhalation Q4H PRN Bobbye MortonMcQuilla, Rohit Deloria B, MD   1 puff at 11/10/21 0702  ? alum & mag hydroxide-simeth (MAALOX/MYLANTA) 200-200-20 MG/5ML suspension 30 mL  30 mL Oral Q4H PRN Lauree ChandlerLee, Jacqueline Eun, NP      ? divalproex (DEPAKOTE) DR tablet 1,000 mg  1,000 mg Oral Q2000 Bobbye MortonMcQuilla, Ebunoluwa Gernert B, MD      ? Melene Muller[START ON 11/11/2021] divalproex (DEPAKOTE) DR tablet 500 mg  500 mg Oral Q breakfast Eliseo GumMcQuilla, Itzamara Casas B, MD      ? hydrOXYzine (ATARAX) tablet 25 mg  25 mg Oral Q6H PRN Eliseo GumMcQuilla, Tarris Delbene B, MD      ? loperamide (IMODIUM) capsule 2-4 mg  2-4 mg Oral PRN Eliseo GumMcQuilla, Abed Schar B, MD      ? OLANZapine zydis (ZYPREXA) disintegrating tablet 5 mg  5 mg Oral Q8H PRN Phineas InchesMassengill, Nathan, MD   5 mg at 11/09/21 1401  ? And  ? LORazepam (ATIVAN) tablet 1 mg  1 mg Oral Q8H PRN Massengill, Nathan, MD      ? And  ? ziprasidone (GEODON) injection 20 mg  20 mg Intramuscular Q8H PRN Massengill, Nathan, MD      ? LORazepam (ATIVAN) tablet 1 mg  1 mg Oral Q6H PRN Eliseo GumMcQuilla, Zahniya Zellars B, MD   1 mg at 11/09/21 1714  ?  magnesium hydroxide (MILK OF MAGNESIA) suspension 30 mL  30 mL Oral Daily PRN Lauree ChandlerLee, Jacqueline Eun, NP      ? multivitamin with minerals tablet 1 tablet  1 tablet Oral Daily Eliseo GumMcQuilla, Claudell Wohler B, MD   1 tablet at 11/10/21 16100724  ? nicotine polacrilex (NICORETTE) gum 2 mg  2 mg  Oral Q4H PRN Lauree Chandler, NP   2 mg at 11/10/21 1105  ? OLANZapine (ZYPREXA) tablet 5 mg  5 mg Oral BID Eliseo Gum B, MD   5 mg at 11/10/21 0724  ? ondansetron (ZOFRAN-ODT) disintegrating tablet 4 mg  4 mg Oral Q6H PRN Bobbye Morton, MD   4 mg at 11/09/21 0615  ? thiamine (B-1) injection 100 mg  100 mg Intramuscular Once Eliseo Gum B, MD      ? thiamine tablet 100 mg  100 mg Oral Daily Eliseo Gum B, MD   100 mg at 11/10/21 0724  ? traZODone (DESYREL) tablet 50 mg  50 mg Oral QHS PRN Lauree Chandler, NP      ? ? ?Lab Results:  ?Results for orders placed or performed during the hospital encounter of 11/07/21 (from the past 48 hour(s))  ?Pregnancy, urine     Status: None  ? Collection Time: 11/09/21  6:00 AM  ?Result Value Ref Range  ? Preg Test, Ur NEGATIVE NEGATIVE  ?  Comment:        ?THE SENSITIVITY OF THIS ?METHODOLOGY IS >20 mIU/mL. ?Performed at [facility], [address], [place], [place] [address] ?  ? ? ?Blood Alcohol level:  ?Lab Results  ?Component Value Date  ? ETH <10 11/06/2021  ? ETH <10 06/13/2018  ? ? ?Metabolic Disorder Labs: ?Lab Results  ?Component Value Date  ? HGBA1C 5.2 11/06/2021  ? MPG 102.54 11/06/2021  ? MPG 105 09/28/2016  ? ?Lab Results  ?Component Value Date  ? PROLACTIN 71.1 (H) 09/28/2016  ? ?Lab Results  ?Component Value Date  ? CHOL 134 11/06/2021  ? TRIG 62 11/06/2021  ? HDL 33 (L) 11/06/2021  ? CHOLHDL 4.1 11/06/2021  ? VLDL 12 11/06/2021  ? LDLCALC 89 11/06/2021  ? LDLCALC 82 09/28/2016  ? ? ?Physical Findings: ?AIMS:  , ,  ,  ,    ?CIWA:  CIWA-Ar Total: 0 ?COWS:    ? ?Musculoskeletal: ?Strength & Muscle Tone: within normal limits ?Gait & Station: normal ?Patient leans:  N/A ? ?Psychiatric Specialty Exam: ? ?Presentation  ?General Appearance: Appropriate for Environment; Casual ? ?Eye Contact:Good ? ?Speech:Clear and Coherent ? ?Speech Volume:Normal ? ?Handedness:No data recorded ?

## 2021-11-10 NOTE — Progress Notes (Addendum)
D: Patient has been very focused on discharge; to the point of asking nursing staff to contact provider regarding same. Informed patient that the provider is the only one who may discharge her. She denies any thoughts of self harm. She has been needy and attention-seeking with staff, mostly with requests for the provider and somatic complaints. 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 She is currently c/o some gum pain; informed her I would ask provider for an order for orajel. ? ?A: Continue to monitor medication management and MD orders.  Safety checks completed every 15 minutes per protocol.  Offer support and encouragement as needed. ? ?R: Patient needs redirection at times. ? ? 11/10/21 1300  ?Psych Admission Type (Psych Patients Only)  ?Admission Status Involuntary  ?Psychosocial Assessment  ?Patient Complaints Anxiety;Worrying;Irritability  ?Eye Contact Fair  ?Facial Expression Fixed smile  ?Affect Inconsistent with thought content  ?Speech Logical/coherent  ?Interaction Forwards little  ?Motor Activity Other (Comment) ?(wnl)  ?Appearance/Hygiene Unremarkable  ?Behavior Characteristics Cooperative  ?Mood Anxious;Pleasant  ?Thought Process  ?Coherency WDL  ?Content WDL  ?Delusions None reported or observed  ?Perception WDL  ?Hallucination None reported or observed  ?Judgment WDL  ?Confusion None  ?Danger to Self  ?Current suicidal ideation? Denies  ?Danger to Others  ?Danger to Others None reported or observed  ?Danger to Others Abnormal  ?Harmful Behavior to others No threats or harm toward other people  ? ? ?

## 2021-11-10 NOTE — Group Note (Signed)
LCSW Group Therapy ? ? ?CSW group not facilitated due to unit limitations on patient proximity/interactions due to infection prevention measures. ? ?Kelly Crosby Kelly Crosby LCSWA  ?10:41 AM  ?

## 2021-11-11 DIAGNOSIS — F250 Schizoaffective disorder, bipolar type: Principal | ICD-10-CM

## 2021-11-11 LAB — COMPREHENSIVE METABOLIC PANEL
ALT: 24 U/L (ref 0–44)
AST: 16 U/L (ref 15–41)
Albumin: 3.7 g/dL (ref 3.5–5.0)
Alkaline Phosphatase: 55 U/L (ref 38–126)
Anion gap: 6 (ref 5–15)
BUN: 10 mg/dL (ref 6–20)
CO2: 22 mmol/L (ref 22–32)
Calcium: 8.8 mg/dL — ABNORMAL LOW (ref 8.9–10.3)
Chloride: 109 mmol/L (ref 98–111)
Creatinine, Ser: 0.71 mg/dL (ref 0.44–1.00)
GFR, Estimated: >60 mL/min (ref >60)
Glucose, Bld: 93 mg/dL (ref 70–99)
Potassium: 4.3 mmol/L (ref 3.5–5.1)
Sodium: 137 mmol/L (ref 135–145)
Total Bilirubin: 0.4 mg/dL (ref 0.3–1.2)
Total Protein: 6.9 g/dL (ref 6.5–8.1)

## 2021-11-11 LAB — CBC WITH DIFFERENTIAL/PLATELET
Abs Immature Granulocytes: 0.02 10*3/uL (ref 0.00–0.07)
Basophils Absolute: 0.1 10*3/uL (ref 0.0–0.1)
Basophils Relative: 1 %
Eosinophils Absolute: 0.2 10*3/uL (ref 0.0–0.5)
Eosinophils Relative: 2 %
HCT: 41.9 % (ref 36.0–46.0)
Hemoglobin: 14.6 g/dL (ref 12.0–15.0)
Immature Granulocytes: 0 %
Lymphocytes Relative: 46 %
Lymphs Abs: 3.9 10*3/uL (ref 0.7–4.0)
MCH: 29 pg (ref 26.0–34.0)
MCHC: 34.8 g/dL (ref 30.0–36.0)
MCV: 83.1 fL (ref 80.0–100.0)
Monocytes Absolute: 0.5 10*3/uL (ref 0.1–1.0)
Monocytes Relative: 6 %
Neutro Abs: 3.8 10*3/uL (ref 1.7–7.7)
Neutrophils Relative %: 45 %
Platelets: 342 10*3/uL (ref 150–400)
RBC: 5.04 MIL/uL (ref 3.87–5.11)
RDW: 13.6 % (ref 11.5–15.5)
WBC: 8.4 10*3/uL (ref 4.0–10.5)
nRBC: 0 % (ref 0.0–0.2)

## 2021-11-11 LAB — VALPROIC ACID LEVEL: Valproic Acid Lvl: 61 ug/mL (ref 50.0–100.0)

## 2021-11-11 MED ORDER — DIVALPROEX SODIUM 500 MG PO DR TAB: 1000.0000 mg | DELAYED_RELEASE_TABLET | Freq: Every day | ORAL | 0 refills | Status: DC

## 2021-11-11 MED ORDER — NICOTINE POLACRILEX 2 MG MT GUM: 2.0000 mg | CHEWING_GUM | OROMUCOSAL | 0 refills | Status: DC | PRN

## 2021-11-11 MED ORDER — OLANZAPINE 5 MG PO TABS: 5.0000 mg | ORAL_TABLET | Freq: Two times a day (BID) | ORAL | 0 refills | Status: DC

## 2021-11-11 MED ORDER — DIVALPROEX SODIUM 500 MG PO DR TAB: 500.0000 mg | DELAYED_RELEASE_TABLET | Freq: Every day | ORAL | 0 refills | Status: DC

## 2021-11-11 MED ORDER — ALBUTEROL SULFATE HFA 108 (90 BASE) MCG/ACT IN AERS: 1.0000 | INHALATION_SPRAY | RESPIRATORY_TRACT | Status: DC | PRN

## 2021-11-11 NOTE — [facility] Suicide Risk Assessment (Signed)
[facility] INPATIENT:  Family/Significant Other Suicide Prevention Education ? ?Suicide Prevention Education:  ?Education Completed; Godmother Lubertha South [phone]), has been identified by the patient as the family member/significant other with whom the patient will be residing, and identified as the person(s) who will aid the patient in the event of a mental health crisis (suicidal ideations/suicide attempt).  With written consent from the patient, the family member/significant other has been provided the following suicide prevention education, prior to the and/or following the discharge of the patient. ? ? ?The suicide prevention education provided includes the following: ?Suicide risk factors ?Suicide prevention and interventions ?National Suicide Hotline telephone number ?[facility] assessment telephone number ?[facility] Emergency Assistance 911 ?[place] and/or Residential Mobile Crisis Unit telephone number ? ?Request made of family/significant other to: ?Remove weapons (e.g., guns, rifles, knives), all items previously/currently identified as safety concern.   ?Remove drugs/medications (over-the-counter, prescriptions, illicit drugs), all items previously/currently identified as a safety concern. ? ?The family member/significant other verbalizes understanding of the suicide prevention education information provided.  The family member/significant other agrees to remove the items of safety concern listed above. ? ?Kelly Crosby A Kelly Crosby ?11/11/2021, 9:33 AM ?

## 2021-11-11 NOTE — Discharge Instructions (Addendum)
?-  Follow-up with your outpatient psychiatric provider -instructions on appointment date, time, and address (location) are provided to you in discharge paperwork. ? ? ?Please come to [facility] (this facility) during walk in hours for appointment with psychiatrist for further medication management and for therapists for therapy.  ? ? Walk in hours are 8-11 AM Monday and Wednesday.  It is first come, first -serve; it is best to arrive by 7:00 AM.  ? ?On Friday from 1 pm to 4 pm for therapy intake only. Please arrive by 12:00 pm as it is  first come, first -serve.   ? ?When you arrive please go upstairs for your appointment. If you are unsure of where to go, inform the front desk that you are here for a walk in appointment and they will assist you with directions upstairs. ? ?Address:  ?[address], in [place], [address] ?Ph: [phone]    ? ?-Take your psychiatric medications as prescribed at discharge - instructions are provided to you in the discharge paperwork ? ?-Follow-up with outpatient primary care doctor and other specialists -for management of chronic medical disease and management of contraception. If you stop your contraception, contact your psychiatrist immediately, as you will need to immediately stop your depakote and discuss alternative mood stabilizing medication for treatment of your psychiatric illness.  ? ?-Recommend abstinence from alcohol, tobacco, and other illicit drug use at discharge.  ? ?-If your psychiatric symptoms recur, worsen, or if you have side effects to your psychiatric medications, call your outpatient psychiatric provider, 911, [phone] or go to the nearest emergency department. ? ?-If suicidal thoughts occur, call your outpatient psychiatric provider, 911, [phone] or go to the nearest emergency department.  ? ?

## 2021-11-11 NOTE — Progress Notes (Signed)
?   11/10/21 2100  ?Psych Admission Type (Psych Patients Only)  ?Admission Status Involuntary  ?Psychosocial Assessment  ?Patient Complaints Worrying  ?Eye Contact Fair  ?Facial Expression Flat  ?Affect Appropriate to circumstance  ?Speech Logical/coherent  ?Interaction Assertive  ?Motor Activity Other (Comment)  ?Appearance/Hygiene Unremarkable  ?Behavior Characteristics Cooperative;Appropriate to situation  ?Mood Pleasant  ?Thought Process  ?Coherency WDL  ?Content WDL  ?Delusions None reported or observed  ?Perception WDL  ?Hallucination None reported or observed  ?Judgment WDL  ?Confusion None  ?Danger to Self  ?Current suicidal ideation? Denies  ?Danger to Others  ?Danger to Others None reported or observed  ?Danger to Others Abnormal  ?Harmful Behavior to others No threats or harm toward other people  ? ? ?

## 2021-11-11 NOTE — Discharge Summary (Signed)
Physician Discharge Summary Note ? ?Patient:  Kelly Crosby is an 31 y.o., female ?MRN:  [id number] ?DOB:  1991-02-27 ?Patient phone:  [phone] (home)  ?Patient address:   ?[address] ?[place] [address],  ?Total Time spent with patient: 20 minutes ? ?Date of Admission:  11/07/2021 ?Date of Discharge: 11/11/2021 ? ?Reason for Admission:  IVC endorsing significant decrease in sleep, bizarre behavior, and getting into altercations requiring police presence. ? ?Principal Problem: Schizoaffective disorder ([place]) ?Discharge Diagnoses: Principal Problem: ?  Schizoaffective disorder ([place]) ?Active Problems: ?  Cannabis use disorder, severe, dependence ([place]) ?  Personality disorder ([place]) ?  PTSD (post-traumatic stress disorder) ? ? ?Past Psychiatric History: Multiple hospitalizations, last one at [facility] was 09/2016 for manic episode ?Patient endorses she was banned from [facility] in 2018 for behavior, but this was temporary ban. ?  ?Dx: Schizoaffective disorder, bipolar type and Cannabis use disorder ?  ?Hx OUTPT: [facility] 2017-2019 ?Hx meds: Zyprexa, Depakote, Trazodone, Haldol (beneficial "when I'm really crazy") and Abilify ? ?Past Medical History:  ?Past Medical History:  ?Diagnosis Date  ? A-fib ([place])   ? Anxiety   ? Depression   ? Schizotaxia   ?  ?Past Surgical History:  ?Procedure Laterality Date  ? NO PAST SURGERIES    ? ?Family History:  ?Family History  ?Problem Relation Age of Onset  ? Hypertension Other   ? Hypertension Mother   ? Post-traumatic stress disorder Father   ? ?Family Psychiatric  History: Mat grand aunt: Bipolar ?Social History:  ?Social History  ? ?Substance and Sexual Activity  ?Alcohol Use Yes  ?   ?Social History  ? ?Substance and Sexual Activity  ?Drug Use Yes  ? Types: Marijuana  ?  ?Social History  ? ?Socioeconomic History  ? Marital status: Single  ?  Spouse name: Not on file  ? Number of children: Not on file  ? Years of education: Not on file  ? Highest education level: Not on  file  ?Occupational History  ? Not on file  ?Tobacco Use  ? Smoking status: Every Day  ?  Packs/day: 1.00  ?  Types: Cigarettes, E-cigarettes  ? Smokeless tobacco: Never  ?Vaping Use  ? Vaping Use: Every day  ?Substance and Sexual Activity  ? Alcohol use: Yes  ? Drug use: Yes  ?  Types: Marijuana  ? Sexual activity: Yes  ?  Birth control/protection: Injection  ?Other Topics Concern  ? Not on file  ?Social History Narrative  ? Not on file  ? ?Social Determinants of Health  ? ?Financial Resource Strain: Not on file  ?Food Insecurity: Not on file  ?Transportation Needs: Not on file  ?Physical Activity: Not on file  ?Stress: Not on file  ?Social Connections: Not on file  ? ? ?Hospital Course:  Kelly Crosby is a 31 yo patient w/ PPH of schizoaffective disorder, bipolar type and cannabis use who presented to [facility] as a transfer from [facility] after presenting with IVC endorsing significant decrease in sleep, bizarre behavior, and getting into altercations requiring police presence. While at [facility] patient had and episode of throwing things and was started on Zyprexa. ? ? ? ?During the patient's hospitalization, patient had extensive initial psychiatric evaluation, and follow-up psychiatric evaluations every day. ?  ?Psychiatric diagnoses provided upon initial assessment:  ?Schizoaffective disorder, bipolar type ?Cannabis use disorder, severe ?PTSD ?Tobacco use disorder ?  ?Patient's psychiatric medications were adjusted on admission:  ?- Continued on Zyp[facility]  ?- Start Depakote 500mg  BID ?  ?During the hospitalization, other adjustments were made to the patient's psychiatric medication regimen:  ?-Depakote increased to 500 mg daily and 1000 mg nightly ?  ?Gradually, patient started adjusting to milieu.   ?Patient's care was discussed during the interdisciplinary team meeting every day during the hospitalization. ?  ?The patient denied having significant adverse side effects  to  prescribed psychiatric medication. ?  ?The patient reports their target psychiatric symptoms of irritability, decreased sleep, and anxiety responded well to the psychiatric medications, and the patient reports overall benefit other psychiatric hospitalization. Supportive psychotherapy was provided to the patient. The patient also participated in regular group therapy while admitted.  ?  ?Labs were reviewed with the patient, and abnormal results were discussed with the patient. ?  ?The patient denied having suicidal thoughts more than 48 hours prior to discharge.  Patient denies having homicidal thoughts.  Patient denies having auditory hallucinations.  Patient denies any visual hallucinations.  Patient denies having paranoid thoughts. ?  ?The patient is able to verbalize their individual safety plan to this provider. ?  ?It is recommended to the patient to continue psychiatric medications as prescribed, after discharge from the hospital.   ?  ?It is recommended to the patient to follow up with your outpatient psychiatric provider and PCP. ?  ?Discussed with the patient, the impact of alcohol, drugs, tobacco have been there overall psychiatric and medical wellbeing, and total abstinence from substance use was recommended the patient.  At discharge patient endorsed that she had spoken with her mother.  Patient reported that she and her mother would discuss patient's behaviors at home but her mother had given her permission to pick up her daughter this afternoon if she was discharged.  Patient maintained that she will continue to live with her godmother and recognize that her own mother was her child's legal guardian and that this was a privilege for her to be able to take her daughter home from school.  Patient endorsed having appropriate coping skills: Deep breathing, walking off, and verbal communication to manage should she and her mother get another disagreement especially regarding her biological childcare. ?  ?   ?Physical Findings: ?AIMS:  , ,  ,  ,    ?CIWA:  CIWA-Ar Total: 0 ?COWS:    ? ?Musculoskeletal: ?Strength & Muscle Tone: within normal limits ?Gait & Station: normal ?Patient leans: N/A ? ? ?Psychiatric Specialty Exam: ? ?Presentation  ?General Appearance: Appropriate for Environment; Casual ? ?Eye Contact:Good ? ?Speech:Clear and Coherent ? ?Speech Volume:Normal ? ?Handedness:No data recorded ? ?Mood and Affect  ?Mood:Euthymic ? ?Affect:Appropriate; Congruent ? ? ?Thought Process  ?Thought Processes:Coherent ? ?Descriptions of Associations:Intact ? ?Orientation:Full (Time, Place and Person) ? ?Thought Content:Logical ? ?History of Schizophrenia/Schizoaffective disorder:Yes ? ?Duration of Psychotic Symptoms:N/A ? ?Hallucinations:Hallucinations: None ? ?Ideas of Reference:None ? ?Suicidal Thoughts:Suicidal Thoughts: No ? ?Homicidal Thoughts:Homicidal Thoughts: No ? ? ?Sensorium  ?Memory:Immediate Good; Recent Good; Remote Good ? ?Judgment:Intact ? ?Insight:Fair ? ? ?Executive Functions  ?Concentration:Good ? ?Attention Span:Good ? ?Recall:Fair ? ?Fund of [place] ? ?Language:Good ? ? ?Psychomotor Activity  ?Psychomotor Activity:Psychomotor Activity: Normal ? ? ?Assets  ?Assets:Communication Skills; Desire for Improvement; Housing; Resilience ? ? ?Sleep  ?Sleep:Sleep: Fair ? ? ? ?Physical Exam: ?Physical Exam ?Constitutional:   ?   Appearance: Normal appearance.  ?HENT:  ?   Head: Normocephalic and atraumatic.  ?Pulmonary:  ?   Effort: Pulmonary effort is normal.  ?Neurological:  ?  Mental Status: She is alert and oriented to person, place, and time.  ? ?Review of Systems  ?Psychiatric/Behavioral:  Negative for hallucinations and suicidal ideas.   ?Blood pressure (!) 119/94, pulse 94, temperature 97.8 ?F (36.6 ?C), temperature source Oral, resp. rate 16, height 5' 6.5" (1.689 m), weight 123.1 kg, SpO2 100 %. Body mass index is 43.15 kg/m?. ? ? ?Social History  ? ?Tobacco Use  ?Smoking Status Every Day  ?  Packs/day: 1.00  ? Types: Cigarettes, E-cigarettes  ?Smokeless Tobacco Never  ? ?Tobacco Cessation:  A prescription for an FDA-approved tobacco cessation medication provided at discharge ? ? ?Blood Alcohol l

## 2021-11-11 NOTE — Progress Notes (Signed)
Recreation Therapy Notes ? ?INPATIENT RECREATION TR PLAN ? ?Patient Details ?Name: Kelly Crosby ?MRN: [id number] ?DOB: [birth year] ?Today's Date: 11/11/2021 ? ?Rec Therapy Plan ?Is patient appropriate for Therapeutic Recreation?: Yes ?Treatment times per week: about 3 days ?Estimated Length of Stay: 5-7 days ?TR Treatment/Interventions: Group participation (Comment) ? ?Discharge Criteria ?Pt will be discharged from therapy if:: Discharged ?Treatment plan/goals/alternatives discussed and agreed upon by:: Patient/family ? ?Discharge Summary ?Short term goals set: See patient care plan ?Short term goals met: Adequate for discharge ?Reason goals not met: None to limited groups on unit ?Therapeutic equipment acquired: N/A ?Reason patient discharged from therapy: Discharge from hospital ?Pt/family agrees with progress & goals achieved: Yes ?Date patient discharged from therapy: 11/11/21 ? ? ? ?Victorino Sparrow, LRT,CTRS ?Victorino Sparrow A ?11/11/2021, 1:36 PM ?

## 2021-11-11 NOTE — Plan of Care (Signed)
Patient had limited to no group sessions due to infection control limitations on unit. ? ? ?Caroll Rancher, LRT,CTRS ?

## 2021-11-11 NOTE — Progress Notes (Signed)
?   11/11/21 0805  ?Psych Admission Type (Psych Patients Only)  ?Admission Status Involuntary  ?Psychosocial Assessment  ?Patient Complaints None  ?Eye Contact Fair  ?Facial Expression Other (Comment) ?(Bright)  ?Affect Appropriate to circumstance  ?Speech Logical/coherent  ?Interaction Assertive  ?Motor Activity Other (Comment)  ?Appearance/Hygiene Unremarkable  ?Behavior Characteristics Cooperative  ?Mood Pleasant  ?Thought Process  ?Coherency WDL  ?Content WDL  ?Delusions None reported or observed  ?Perception WDL  ?Hallucination None reported or observed  ?Judgment WDL  ?Confusion None  ?Danger to Self  ?Current suicidal ideation? Denies  ?Danger to Others  ?Danger to Others None reported or observed  ?Danger to Others Abnormal  ?Harmful Behavior to others No threats or harm toward other people  ? ? ?

## 2021-11-11 NOTE — Plan of Care (Signed)
?  Problem: Coping: ?Goal: Ability to verbalize frustrations and anger appropriately will improve ?Outcome: Progressing ?Goal: Ability to demonstrate self-control will improve ?Outcome: Progressing ?  ?Problem: Safety: ?Goal: Periods of time without injury will increase ?Outcome: Progressing ?  ?Problem: Education: ?Goal: Knowledge of disease or condition will improve ?Outcome: Progressing ?Goal: Understanding of discharge needs will improve ?Outcome: Progressing ?  ?Problem: Safety: ?Goal: Ability to remain free from injury will improve ?Outcome: Progressing ?  ?

## 2021-11-11 NOTE — [facility] Suicide Risk Assessment (Addendum)
Suicide Risk Assessment ? ?Discharge Assessment    ?[facility]charge Suicide Risk [facility] ? ? ?Principal Problem: Schizoaffective disorder ([facility]) ?Discharge Diagnoses: Principal Problem: ?  Schizoaffective disorder ([facility]) ?Active Problems: ?  Cannabis use disorder, severe, dependence ([facility]) ?  Personality disorder ([facility]) ?  PTSD (post-traumatic stress disorder) ? ? ?Total Time spent with patient: 20 minutes ?Kelly Crosby is a 31 yo patient w/ PPH of schizoaffective disorder, bipolar type and cannabis use who [facility] presenting with IVC endorsing significant decrease in sleep, bizarre behavior, and getting into altercations requiring police presence. [facility] and episode of throwing things and was started on Zyprexa. ? ? ?During the patient's hospitalization, patient had extensive initial psychiatric evaluation, and follow-up psychiatric evaluations every day. ? ?Psychiatric diagnoses provided upon initial assessment:  ?Schizoaffective disorder, bipolar type ?Cannabis use disorder, severe ?PTSD ?Tobacco use disorder ? ?Patient's psychiatric medications were adjusted on admission:  ?- Continued on Zyprexa [facility]  ?- Start Depakote 500mg  BID ? ?During the hospitalization, other adjustments were made to the patient's psychiatric medication regimen:  ?-Depakote increased to 500 mg daily and 1000 mg nightly ? ?Gradually, patient started adjusting to milieu.   ?Patient's care was discussed during the interdisciplinary team meeting every day during the hospitalization. ? ?The patient denied having significant adverse side effects  to prescribed psychiatric medication. ? ?The patient reports their target psychiatric symptoms of irritability, decreased sleep, and anxiety responded well to the psychiatric medications, and the patient reports overall benefit other psychiatric hospitalization. Supportive psychotherapy was provided to  the patient. The patient also participated in regular group therapy while admitted.  ? ?Labs were reviewed with the patient, and abnormal results were discussed with the patient. ? ?The patient denied having suicidal thoughts more than 48 hours prior to discharge.  Patient denies having homicidal thoughts.  Patient denies having auditory hallucinations.  Patient denies any visual hallucinations.  Patient denies having paranoid thoughts. ? ?The patient is able to verbalize their individual safety plan to this provider. ? ?It is recommended to the patient to continue psychiatric medications as prescribed, after discharge from the hospital.   ? ?It is recommended to the patient to follow up with your outpatient psychiatric provider and PCP. ? ?Discussed with the patient, the impact of alcohol, drugs, tobacco have been there overall psychiatric and medical wellbeing, and total abstinence from substance use was recommended the patient.  ? ? ? ?Musculoskeletal: ?Strength & Muscle Tone: within normal limits ?Gait & Station: normal ?Patient leans: N/A ? ?Psychiatric Specialty Exam ? ?Presentation  ?General Appearance: Appropriate for Environment; Casual ? ?Eye Contact:Good ? ?Speech:Clear and Coherent ? ?Speech Volume:Normal ? ?Handedness:No data recorded ? ?Mood and Affect  ?Mood:Euthymic ? ?Duration of Depression Symptoms: No data recorded ?Affect:Appropriate; Congruent ? ? ?Thought Process  ?Thought Processes:Coherent ? ?Descriptions of Associations:Intact ? ?Orientation:Full (Time, Place and Person) ? ?Thought Content:Logical ? ?History of Schizophrenia/Schizoaffective disorder:Yes ? ?Duration of Psychotic Symptoms:N/A ? ?Hallucinations:Hallucinations: None ? ?Ideas of Reference:None ? ?Suicidal Thoughts:Suicidal Thoughts: No ? ?Homicidal Thoughts:Homicidal Thoughts: No ? ? ?Sensorium  ?Memory:Immediate Good; Recent Good; Remote Good ? ?Judgment:Intact ? ?Insight:Fair ? ? ?Executive Functions   ?Concentration:Good ? ?Attention Span:Good ? ?Recall:Fair ? ?Fund of Knowledge:Good ? ?Language:Good ? ? ?Psychomotor Activity  ?Psychomotor Activity:Psychomotor Activity: Normal ? ? ?Assets  ?Assets:Communication Skills; Desire for Improvement; Housing; Resilience ? ? ?Sleep  ?Sleep:Sleep: Fair ? ? ?Physical Exam: ?Physical Exam ?  HENT:  ?   Head: Normocephalic and atraumatic.  ?Pulmonary:  ?   Effort: Pulmonary effort is normal.  ?Neurological:  ?   Mental Status: She is alert and oriented to person, place, and time.  ? ?Review of Systems  ?Psychiatric/Behavioral:  Negative for depression, hallucinations and suicidal ideas.   ?Blood pressure (!) 119/94, pulse 94, temperature 97.8 ?F (36.6 ?C), temperature source Oral, resp. rate 16, height 5' 6.5" (1.689 m), weight 123.1 kg, SpO2 100 %. Body mass index is 43.15 kg/m?. ? ?Mental Status Per Nursing Assessment::   ?On Admission:  NA ? ?Demographic Factors:  ?NA ? ?Loss Factors: ?Legal issues ? ?Historical Factors: ?Family history of mental illness or substance abuse ? ?Risk Reduction Factors:   ?Living with another person, especially a relative ? ?Continued Clinical Symptoms:  ?Denies ? ?Cognitive Features That Contribute To Risk:  ?None   ? ?Suicide Risk:  ?Minimal: No identifiable suicidal ideation.  Patients presenting with no risk factors but with morbid ruminations; may be classified as minimal risk based on the severity of the depressive symptoms ? ? Follow-up Information   ? ? [facility]. Go to.   ?Specialty: Behavioral Health ?Why: Please go to this provider for therapy and medication management services during walk in hours:  Mondays and Wednesdays, arrive by 7:30 am.  Services are provided on a first come, first served basis. ?Contact information: ?[address] ?[place] [address] ?[phone] ? ?  ?  ? ?  ?  ? ?  ? ? ?Plan Of Care/Follow-up recommendations:  ?Activity: as tolerated ? ?Diet: heart  healthy ? ?Other: ?-Follow-up with your outpatient psychiatric provider -instructions on appointment date, time, and address (location) are provided to you in discharge paperwork. ? ?-Take your psychiatric medications as prescribed at discharge - instructions are provided to you in the discharge paperwork ? ?-Follow-up with outpatient primary care doctor.  ? ?-Testing: Follow-up with outpatient psychiatric provider for monitoring of Depakote levels ? ?-Recommend abstinence from alcohol, tobacco, and other illicit drug use at discharge.  ? ?-If your psychiatric symptoms recur, worsen, or if you have side effects to your psychiatric medications, call your outpatient psychiatric provider, 911, [phone] or go to the nearest emergency department. ? ?-If suicidal thoughts recur, call your outpatient psychiatric provider, 911, [phone] or go to the nearest emergency department.  ? ? ?PGY-2 ?Bobbye Morton, MD ?11/11/2021, 10:03 AM ? ?Total Time Spent in Direct Patient Care:  ?I personally spent 45 minutes on the unit in direct patient care. The direct patient care time included face-to-face time with the patient, reviewing the patient's chart, communicating with other professionals, and coordinating care. Greater than 50% of this time was spent in counseling or coordinating care with the patient regarding goals of hospitalization, psycho-education, and discharge planning needs. ? ?On my assessment the patient denied SI, HI, AVH, paranoia, ideas of reference, or first rank symptoms on day of discharge. Patient denied drug cravings or active signs of withdrawal. Patient denied medication side-effects. Patient was not deemed to be a danger to self or others on day of discharge and was in agreement with discharge plans.  ? ?I have independently evaluated the patient during a face-to-face assessment on the day of discharge. I reviewed the patient's chart, and I participated in key portions of the service. I discussed the case with the  resident physician, and I agree with the assessment and plan of care as documented in the resident physician's note, as  addended by me or notated below: ? ?I agree with the SRA. ? ?Phineas Inches, MD ?Psychiatrist  ? ?

## 2021-11-11 NOTE — Progress Notes (Signed)
Kelly Crosby  D/C'd Home per MD order.  Discussed with the patient and all questions fully answered. Patient denies SI/HI/AVH and was in good spirit while going through the discharged information with her. 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 Patient was appreciative of the care she received at the adult [facility]. ? ?An After Visit Summary was printed and given to the patient. Patient received medication samples.  ? ?D/c education completed with patient including follow up instructions, medication list, d/c activities limitations if indicated, with other d/c instructions as indicated by MD Medication samples and patient belongings were handed to her at discharged - patient able to verbalize understanding, all questions fully answered.  ? ? ?Patient escorted to the door, and D/C home at 1058. ? ?[name] Matilde Sprang ?11/11/2021 11:03 AM  ?

## 2021-11-11 NOTE — Progress Notes (Signed)
?  [facility] Adult Case Management Discharge Plan : ? ?Will you be returning to the same living situation after discharge:  No. Will be staying with god-mother ?At discharge, do you have transportation home?: Yes,  god mtoher to pick this patient up ?Do you have the ability to pay for your medications: No. Samples to be provided at discharge  ? ?Release of information consent forms completed and in the chart;  Patient's signature needed at discharge. ? ?Patient to Follow up at: ? Follow-up Information   ? ? [facility]. Go to.   ?Specialty: Behavioral Health ?Why: Please go to this provider for therapy and medication management services during walk in hours:  Mondays and Wednesdays, arrive by 7:30 am.  Services are provided on a first come, first served basis. ?Contact information: ?[address] ?[place] [address] ?[phone] ? ?  ?  ? ?  ?  ? ?  ? ? ?Next level of care provider has access to [facility] Link:yes ? ?Safety Planning and Suicide Prevention discussed: Yes,  with god-mother ? ?  ? ?Has patient been referred to the Quitline?: Yes, faxed on 11/11/2021 ? ?Patient has been referred for addiction treatment: Yes- Can receive substance use counseling through the [facility].  ? ?Otelia Santee, LCSW ?11/11/2021, 9:37 AM ?

## 2022-01-04 ENCOUNTER — Emergency Department (HOSPITAL_COMMUNITY)
Admission: EM | Admit: 2022-01-04 | Discharge: 2022-01-04 | Disposition: A | Discharge status: Home / Self Care | Payer: No Typology Code available for payment source | Attending: Emergency Medicine | Admitting: Emergency Medicine

## 2022-01-04 ENCOUNTER — Other Ambulatory Visit: Payer: Self-pay

## 2022-01-04 ENCOUNTER — Encounter (HOSPITAL_COMMUNITY): Payer: Self-pay | Admitting: Emergency Medicine

## 2022-01-04 DIAGNOSIS — R451 Restlessness and agitation: Secondary | ICD-10-CM | POA: Insufficient documentation

## 2022-01-04 DIAGNOSIS — Z20822 Contact with and (suspected) exposure to covid-19: Secondary | ICD-10-CM | POA: Diagnosis not present

## 2022-01-04 DIAGNOSIS — W3400XD Accidental discharge from unspecified firearms or gun, subsequent encounter: Secondary | ICD-10-CM | POA: Diagnosis not present

## 2022-01-04 DIAGNOSIS — Z48815 Encounter for surgical aftercare following surgery on the digestive system: Secondary | ICD-10-CM | POA: Diagnosis not present

## 2022-01-04 DIAGNOSIS — Z4889 Encounter for other specified surgical aftercare: Secondary | ICD-10-CM

## 2022-01-04 DIAGNOSIS — Y900 Blood alcohol level of less than 20 mg/100 ml: Secondary | ICD-10-CM | POA: Insufficient documentation

## 2022-01-04 DIAGNOSIS — F22 Delusional disorders: Secondary | ICD-10-CM | POA: Diagnosis not present

## 2022-01-04 DIAGNOSIS — S31109D Unspecified open wound of abdominal wall, unspecified quadrant without penetration into peritoneal cavity, subsequent encounter: Secondary | ICD-10-CM | POA: Diagnosis not present

## 2022-01-04 DIAGNOSIS — F419 Anxiety disorder, unspecified: Secondary | ICD-10-CM

## 2022-01-04 LAB — COMPREHENSIVE METABOLIC PANEL
ALT: 39 U/L (ref 0–44)
AST: 27 U/L (ref 15–41)
Albumin: 2.6 g/dL — ABNORMAL LOW (ref 3.5–5.0)
Alkaline Phosphatase: 67 U/L (ref 38–126)
Anion gap: 7 (ref 5–15)
BUN: <5 mg/dL — ABNORMAL LOW (ref 6–20)
CO2: 25 mmol/L (ref 22–32)
Calcium: 8.5 mg/dL — ABNORMAL LOW (ref 8.9–10.3)
Chloride: 110 mmol/L (ref 98–111)
Creatinine, Ser: 0.7 mg/dL (ref 0.44–1.00)
GFR, Estimated: >60 mL/min (ref >60)
Glucose, Bld: 91 mg/dL (ref 70–99)
Potassium: 4 mmol/L (ref 3.5–5.1)
Sodium: 142 mmol/L (ref 135–145)
Total Bilirubin: 0.6 mg/dL (ref 0.3–1.2)
Total Protein: 6.3 g/dL — ABNORMAL LOW (ref 6.5–8.1)

## 2022-01-04 LAB — CBC WITH DIFFERENTIAL/PLATELET
Abs Immature Granulocytes: 0.05 10*3/uL (ref 0.00–0.07)
Basophils Absolute: 0.1 10*3/uL (ref 0.0–0.1)
Basophils Relative: 0 %
Eosinophils Absolute: 0.3 10*3/uL (ref 0.0–0.5)
Eosinophils Relative: 2 %
HCT: 24.2 % — ABNORMAL LOW (ref 36.0–46.0)
Hemoglobin: 8.1 g/dL — ABNORMAL LOW (ref 12.0–15.0)
Immature Granulocytes: 0 %
Lymphocytes Relative: 27 %
Lymphs Abs: 3.3 10*3/uL (ref 0.7–4.0)
MCH: 28.4 pg (ref 26.0–34.0)
MCHC: 33.5 g/dL (ref 30.0–36.0)
MCV: 84.9 fL (ref 80.0–100.0)
Monocytes Absolute: 0.9 10*3/uL (ref 0.1–1.0)
Monocytes Relative: 7 %
Neutro Abs: 8 10*3/uL — ABNORMAL HIGH (ref 1.7–7.7)
Neutrophils Relative %: 64 %
Platelets: 823 10*3/uL — ABNORMAL HIGH (ref 150–400)
RBC: 2.85 MIL/uL — ABNORMAL LOW (ref 3.87–5.11)
RDW: 14 % (ref 11.5–15.5)
WBC: 12.5 10*3/uL — ABNORMAL HIGH (ref 4.0–10.5)
nRBC: 0 % (ref 0.0–0.2)

## 2022-01-04 LAB — ACETAMINOPHEN LEVEL: Acetaminophen (Tylenol), Serum: <10 ug/mL — ABNORMAL LOW (ref 10–30)

## 2022-01-04 LAB — RESP PANEL BY RT-PCR (FLU A&B, COVID) ARPGX2
Influenza A by PCR: NEGATIVE
Influenza B by PCR: NEGATIVE
SARS Coronavirus 2 by RT PCR: NEGATIVE

## 2022-01-04 LAB — PREGNANCY, URINE: Preg Test, Ur: NEGATIVE

## 2022-01-04 LAB — SALICYLATE LEVEL: Salicylate Lvl: <7 mg/dL — ABNORMAL LOW (ref 7.0–30.0)

## 2022-01-04 LAB — ETHANOL: Alcohol, Ethyl (B): <10 mg/dL (ref <10)

## 2022-01-04 MED ORDER — LORAZEPAM 1 MG PO TABS
1.0000 mg | ORAL_TABLET | Freq: Once | ORAL | Status: DC
  Filled 2022-01-04: qty 1

## 2022-01-04 MED ORDER — GABAPENTIN 100 MG PO CAPS
100.0000 mg | ORAL_CAPSULE | Freq: Once | ORAL | Status: AC
  Administered 2022-01-04: 100 mg via ORAL
  Filled 2022-01-04: qty 1

## 2022-01-04 NOTE — ED Notes (Signed)
2 ABD pads placed over surgical incision and secured with hyperfix tape
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
## 2022-01-04 NOTE — Discharge Instructions (Addendum)
You need to call the trauma surgeons to set up a follow visit. This number is located on your discharge paperwork from [facility]. This is really important so that you can have your surgical site reassessed by them.   Please follow up with [facility] for your wound care needs if needed. The office is located at [address], [place], [place] [address]. The phone number is [phone].  Please return here for worsening symptoms.
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
## 2022-01-05 ENCOUNTER — Emergency Department (HOSPITAL_COMMUNITY)
Admission: EM | Admit: 2022-01-05 | Discharge: 2022-01-05 | Disposition: A | Discharge status: Home / Self Care | Payer: No Typology Code available for payment source | Attending: Emergency Medicine | Admitting: Emergency Medicine

## 2022-01-05 ENCOUNTER — Encounter (HOSPITAL_COMMUNITY): Payer: Self-pay

## 2022-01-05 ENCOUNTER — Emergency Department (HOSPITAL_COMMUNITY): Payer: No Typology Code available for payment source

## 2022-01-05 DIAGNOSIS — D72829 Elevated white blood cell count, unspecified: Secondary | ICD-10-CM | POA: Diagnosis not present

## 2022-01-05 DIAGNOSIS — D649 Anemia, unspecified: Secondary | ICD-10-CM | POA: Diagnosis not present

## 2022-01-05 DIAGNOSIS — R1030 Lower abdominal pain, unspecified: Secondary | ICD-10-CM

## 2022-01-05 LAB — COMPREHENSIVE METABOLIC PANEL
ALT: 41 U/L (ref 0–44)
AST: 31 U/L (ref 15–41)
Albumin: 3.2 g/dL — ABNORMAL LOW (ref 3.5–5.0)
Alkaline Phosphatase: 76 U/L (ref 38–126)
Anion gap: 6 (ref 5–15)
BUN: 8 mg/dL (ref 6–20)
CO2: 27 mmol/L (ref 22–32)
Calcium: 8.8 mg/dL — ABNORMAL LOW (ref 8.9–10.3)
Chloride: 108 mmol/L (ref 98–111)
Creatinine, Ser: 0.77 mg/dL (ref 0.44–1.00)
GFR, Estimated: >60 mL/min (ref >60)
Glucose, Bld: 92 mg/dL (ref 70–99)
Potassium: 3.9 mmol/L (ref 3.5–5.1)
Sodium: 141 mmol/L (ref 135–145)
Total Bilirubin: 0.5 mg/dL (ref 0.3–1.2)
Total Protein: 7.7 g/dL (ref 6.5–8.1)

## 2022-01-05 LAB — CBC WITH DIFFERENTIAL/PLATELET
Abs Immature Granulocytes: 0.04 10*3/uL (ref 0.00–0.07)
Basophils Absolute: 0.1 10*3/uL (ref 0.0–0.1)
Basophils Relative: 1 %
Eosinophils Absolute: 0.4 10*3/uL (ref 0.0–0.5)
Eosinophils Relative: 3 %
HCT: 28.7 % — ABNORMAL LOW (ref 36.0–46.0)
Hemoglobin: 9.7 g/dL — ABNORMAL LOW (ref 12.0–15.0)
Immature Granulocytes: 0 %
Lymphocytes Relative: 27 %
Lymphs Abs: 3.2 10*3/uL (ref 0.7–4.0)
MCH: 28.6 pg (ref 26.0–34.0)
MCHC: 33.8 g/dL (ref 30.0–36.0)
MCV: 84.7 fL (ref 80.0–100.0)
Monocytes Absolute: 0.7 10*3/uL (ref 0.1–1.0)
Monocytes Relative: 6 %
Neutro Abs: 7.4 10*3/uL (ref 1.7–7.7)
Neutrophils Relative %: 63 %
Platelets: 815 10*3/uL — ABNORMAL HIGH (ref 150–400)
RBC: 3.39 MIL/uL — ABNORMAL LOW (ref 3.87–5.11)
RDW: 14.3 % (ref 11.5–15.5)
WBC: 11.9 10*3/uL — ABNORMAL HIGH (ref 4.0–10.5)
nRBC: 0 % (ref 0.0–0.2)

## 2022-01-05 LAB — URINALYSIS, ROUTINE W REFLEX MICROSCOPIC
Bilirubin Urine: NEGATIVE
Glucose, UA: NEGATIVE mg/dL
Hgb urine dipstick: NEGATIVE
Ketones, ur: NEGATIVE mg/dL
Leukocytes,Ua: NEGATIVE
Nitrite: NEGATIVE
Protein, ur: NEGATIVE mg/dL
Specific Gravity, Urine: 1.02 (ref 1.005–1.030)
pH: 6 (ref 5.0–8.0)

## 2022-01-05 LAB — LIPASE, BLOOD: Lipase: 36 U/L (ref 11–51)

## 2022-01-05 MED ORDER — AMOXICILLIN-POT CLAVULANATE 875-125 MG PO TABS: 1.0000 | ORAL_TABLET | Freq: Two times a day (BID) | ORAL | 0 refills | Status: DC

## 2022-01-05 MED ORDER — IOHEXOL 300 MG/ML  SOLN
100.0000 mL | Freq: Once | INTRAMUSCULAR | Status: AC | PRN
  Administered 2022-01-05: 100 mL via INTRAVENOUS

## 2022-01-05 MED ORDER — AMOXICILLIN-POT CLAVULANATE 875-125 MG PO TABS
1.0000 | ORAL_TABLET | Freq: Once | ORAL | Status: DC
  Filled 2022-01-05: qty 1

## 2022-01-05 MED ORDER — FLUCONAZOLE 150 MG PO TABS
150.0000 mg | ORAL_TABLET | Freq: Once | ORAL | Status: DC
  Filled 2022-01-05: qty 1

## 2022-01-05 NOTE — ED Notes (Signed)
Pt refusing to let EMT take IV out and she will walk with it to [facility]. Informed that the IV must be removed prior to D/C.
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
## 2022-01-05 NOTE — ED Notes (Signed)
Pt refusing to take abx at this time, stating she will just go to [facility].

## 2022-01-05 NOTE — Discharge Instructions (Addendum)
You came to the emergency department today to be evaluated for your abdominal pain.  The CT scan of your abdomen/pelvis showed that there was a collection of fluid in your pelvis.  Due to this you are being started on the antibiotic Augmentin, please take this as prescribed.  Please follow-up closely with the team that performed surgery on you.  You may have diarrhea from the antibiotics.  It is very important that you continue to take the antibiotics even if you get diarrhea unless a medical professional tells you that you may stop taking them.  If you stop too early the bacteria you are being treated for will become stronger and you may need different, more powerful antibiotics that have more side effects and worsening diarrhea.  Please stay well hydrated and consider probiotics as they may decrease the severity of your diarrhea.  Please be aware that if you take any hormonal contraception (birth control pills, nexplanon, the ring, etc) that your birth control will not work while you are taking antibiotics and you need to use back up protection as directed on the birth control medication information insert.    Please return to [facility] emergency department if you develop: -Worsening of your abdominal pain -Fevers -Nausea or vomiting -New or concerning symptoms
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
## 2022-01-05 NOTE — ED Notes (Addendum)
Pt called out for restroom assistance. Pt resistant to walking to restroom and wants to use a bed pan. I advised that the pt is up for discharge and it would be good to see if she can ambulate. Pt states she is freezing and if she's being d/c'd she might have a seizure due to being cold and wind right back here. She is also concerned that she is being d/c'd and states she is going [facility] once she's d/c'd. I explained that the PA spoke with general surgery and that they do not feel she needs to be admitted at this time. Pt states that the PA already told her that but that "this just doesn't seem right." PA and RN notified and aware.

## 2022-01-20 ENCOUNTER — Emergency Department (HOSPITAL_COMMUNITY): Payer: No Typology Code available for payment source

## 2022-01-20 ENCOUNTER — Encounter (HOSPITAL_COMMUNITY): Payer: Self-pay

## 2022-01-20 ENCOUNTER — Other Ambulatory Visit: Payer: Self-pay

## 2022-01-20 ENCOUNTER — Emergency Department (HOSPITAL_COMMUNITY)
Admission: EM | Admit: 2022-01-20 | Discharge: 2022-01-20 | Disposition: A | Discharge status: Home / Self Care | Payer: No Typology Code available for payment source | Attending: Emergency Medicine | Admitting: Emergency Medicine

## 2022-01-20 ENCOUNTER — Emergency Department (HOSPITAL_BASED_OUTPATIENT_CLINIC_OR_DEPARTMENT_OTHER): Payer: No Typology Code available for payment source

## 2022-01-20 DIAGNOSIS — D649 Anemia, unspecified: Secondary | ICD-10-CM | POA: Insufficient documentation

## 2022-01-20 DIAGNOSIS — Z91010 Allergy to peanuts: Secondary | ICD-10-CM | POA: Insufficient documentation

## 2022-01-20 DIAGNOSIS — W19XXXA Unspecified fall, initial encounter: Secondary | ICD-10-CM | POA: Diagnosis not present

## 2022-01-20 DIAGNOSIS — M79605 Pain in left leg: Secondary | ICD-10-CM

## 2022-01-20 DIAGNOSIS — Y92481 Parking lot as the place of occurrence of the external cause: Secondary | ICD-10-CM | POA: Insufficient documentation

## 2022-01-20 LAB — CBC WITH DIFFERENTIAL/PLATELET
Abs Immature Granulocytes: 0.04 10*3/uL (ref 0.00–0.07)
Basophils Absolute: 0.1 10*3/uL (ref 0.0–0.1)
Basophils Relative: 1 %
Eosinophils Absolute: 0.4 10*3/uL (ref 0.0–0.5)
Eosinophils Relative: 5 %
HCT: 32.3 % — ABNORMAL LOW (ref 36.0–46.0)
Hemoglobin: 11.2 g/dL — ABNORMAL LOW (ref 12.0–15.0)
Immature Granulocytes: 0 %
Lymphocytes Relative: 38 %
Lymphs Abs: 3.6 10*3/uL (ref 0.7–4.0)
MCH: 28.7 pg (ref 26.0–34.0)
MCHC: 34.7 g/dL (ref 30.0–36.0)
MCV: 82.8 fL (ref 80.0–100.0)
Monocytes Absolute: 0.5 10*3/uL (ref 0.1–1.0)
Monocytes Relative: 6 %
Neutro Abs: 4.9 10*3/uL (ref 1.7–7.7)
Neutrophils Relative %: 50 %
Platelets: 366 10*3/uL (ref 150–400)
RBC: 3.9 MIL/uL (ref 3.87–5.11)
RDW: 14.4 % (ref 11.5–15.5)
WBC: 9.5 10*3/uL (ref 4.0–10.5)
nRBC: 0 % (ref 0.0–0.2)

## 2022-01-20 LAB — BASIC METABOLIC PANEL
Anion gap: 8 (ref 5–15)
BUN: 6 mg/dL (ref 6–20)
CO2: 24 mmol/L (ref 22–32)
Calcium: 8.8 mg/dL — ABNORMAL LOW (ref 8.9–10.3)
Chloride: 106 mmol/L (ref 98–111)
Creatinine, Ser: 0.89 mg/dL (ref 0.44–1.00)
GFR, Estimated: >60 mL/min (ref >60)
Glucose, Bld: 108 mg/dL — ABNORMAL HIGH (ref 70–99)
Potassium: 3.5 mmol/L (ref 3.5–5.1)
Sodium: 138 mmol/L (ref 135–145)

## 2022-01-20 LAB — SEDIMENTATION RATE: Sed Rate: 40 mm/hr — ABNORMAL HIGH (ref 0–22)

## 2022-01-20 LAB — C-REACTIVE PROTEIN: CRP: 0.8 mg/dL (ref <1.0)

## 2022-01-20 LAB — I-STAT BETA HCG BLOOD, ED (MC, WL, AP ONLY): I-stat hCG, quantitative: <5 m[IU]/mL (ref <5)

## 2022-01-20 MED ORDER — IBUPROFEN 400 MG PO TABS
600.0000 mg | ORAL_TABLET | Freq: Once | ORAL | Status: AC
  Administered 2022-01-20: 600 mg via ORAL
  Filled 2022-01-20: qty 1
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
## 2022-01-20 NOTE — ED Notes (Signed)
Called pt x3 for vitals, no response. 
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
## 2022-01-20 NOTE — ED Notes (Signed)
Pt transported to vascular.  °

## 2022-01-20 NOTE — Progress Notes (Signed)
Left lower extremity venous duplex completed. Refer to "CV Proc" under chart review to view preliminary results.  01/20/2022 1:38 PM [name]., MHA, RVT, RDCS, RDMS

## 2022-01-21 ENCOUNTER — Other Ambulatory Visit: Payer: Self-pay

## 2022-01-21 ENCOUNTER — Emergency Department (HOSPITAL_COMMUNITY)
Admission: EM | Admit: 2022-01-21 | Discharge: 2022-01-21 | Disposition: A | Discharge status: Home / Self Care | Payer: Medicaid Other | Attending: Emergency Medicine | Admitting: Emergency Medicine

## 2022-01-21 ENCOUNTER — Encounter (HOSPITAL_COMMUNITY): Payer: Self-pay

## 2022-01-21 DIAGNOSIS — F259 Schizoaffective disorder, unspecified: Secondary | ICD-10-CM | POA: Insufficient documentation

## 2022-01-21 DIAGNOSIS — Z91040 Latex allergy status: Secondary | ICD-10-CM | POA: Insufficient documentation

## 2022-01-21 DIAGNOSIS — Z20822 Contact with and (suspected) exposure to covid-19: Secondary | ICD-10-CM | POA: Insufficient documentation

## 2022-01-21 DIAGNOSIS — N9489 Other specified conditions associated with female genital organs and menstrual cycle: Secondary | ICD-10-CM | POA: Insufficient documentation

## 2022-01-21 DIAGNOSIS — F4310 Post-traumatic stress disorder, unspecified: Secondary | ICD-10-CM | POA: Insufficient documentation

## 2022-01-21 DIAGNOSIS — R45851 Suicidal ideations: Secondary | ICD-10-CM | POA: Insufficient documentation

## 2022-01-21 LAB — COMPREHENSIVE METABOLIC PANEL
ALT: 14 U/L (ref 0–44)
AST: 15 U/L (ref 15–41)
Albumin: 4 g/dL (ref 3.5–5.0)
Alkaline Phosphatase: 60 U/L (ref 38–126)
Anion gap: 9 (ref 5–15)
BUN: 5 mg/dL — ABNORMAL LOW (ref 6–20)
CO2: 23 mmol/L (ref 22–32)
Calcium: 9.2 mg/dL (ref 8.9–10.3)
Chloride: 107 mmol/L (ref 98–111)
Creatinine, Ser: 0.9 mg/dL (ref 0.44–1.00)
GFR, Estimated: >60 mL/min (ref >60)
Glucose, Bld: 92 mg/dL (ref 70–99)
Potassium: 3.7 mmol/L (ref 3.5–5.1)
Sodium: 139 mmol/L (ref 135–145)
Total Bilirubin: 0.7 mg/dL (ref 0.3–1.2)
Total Protein: 7.3 g/dL (ref 6.5–8.1)

## 2022-01-21 LAB — I-STAT BETA HCG BLOOD, ED (MC, WL, AP ONLY): I-stat hCG, quantitative: <5 m[IU]/mL (ref <5)

## 2022-01-21 LAB — ACETAMINOPHEN LEVEL: Acetaminophen (Tylenol), Serum: <10 ug/mL — ABNORMAL LOW (ref 10–30)

## 2022-01-21 LAB — CBC
HCT: 34.3 % — ABNORMAL LOW (ref 36.0–46.0)
Hemoglobin: 11.5 g/dL — ABNORMAL LOW (ref 12.0–15.0)
MCH: 27.8 pg (ref 26.0–34.0)
MCHC: 33.5 g/dL (ref 30.0–36.0)
MCV: 82.9 fL (ref 80.0–100.0)
Platelets: 383 10*3/uL (ref 150–400)
RBC: 4.14 MIL/uL (ref 3.87–5.11)
RDW: 14.5 % (ref 11.5–15.5)
WBC: 8.5 10*3/uL (ref 4.0–10.5)
nRBC: 0 % (ref 0.0–0.2)

## 2022-01-21 LAB — RESP PANEL BY RT-PCR (FLU A&B, COVID) ARPGX2
Influenza A by PCR: NEGATIVE
Influenza B by PCR: NEGATIVE
SARS Coronavirus 2 by RT PCR: NEGATIVE

## 2022-01-21 LAB — RAPID URINE DRUG SCREEN, HOSP PERFORMED
Amphetamines: NOT DETECTED
Barbiturates: NOT DETECTED
Benzodiazepines: NOT DETECTED
Cocaine: NOT DETECTED
Opiates: NOT DETECTED
Tetrahydrocannabinol: POSITIVE — AB

## 2022-01-21 LAB — SALICYLATE LEVEL: Salicylate Lvl: <7 mg/dL — ABNORMAL LOW (ref 7.0–30.0)

## 2022-01-21 LAB — ETHANOL: Alcohol, Ethyl (B): <10 mg/dL (ref <10)

## 2022-01-21 MED ORDER — DIVALPROEX SODIUM 500 MG PO DR TAB
500.0000 mg | DELAYED_RELEASE_TABLET | Freq: Two times a day (BID) | ORAL | Status: DC
Start: 2022-01-21 — End: 2022-01-21
  Administered 2022-01-21: 500 mg via ORAL
  Filled 2022-01-21: qty 1

## 2022-01-21 MED ORDER — OLANZAPINE 5 MG PO TABS: 5.0000 mg | ORAL_TABLET | Freq: Two times a day (BID) | ORAL | 0 refills | Status: DC

## 2022-01-21 MED ORDER — CEPHALEXIN 250 MG PO CAPS
500.0000 mg | ORAL_CAPSULE | Freq: Two times a day (BID) | ORAL | Status: DC
  Administered 2022-01-21: 500 mg via ORAL
  Filled 2022-01-21: qty 2

## 2022-01-21 MED ORDER — ONDANSETRON HCL 4 MG PO TABS: 4.0000 mg | ORAL_TABLET | Freq: Three times a day (TID) | ORAL | Status: DC | PRN

## 2022-01-21 MED ORDER — ACETAMINOPHEN 325 MG PO TABS: 650.0000 mg | ORAL_TABLET | ORAL | Status: DC | PRN

## 2022-01-21 MED ORDER — DEPAKOTE 500 MG PO TBEC: 500.0000 mg | DELAYED_RELEASE_TABLET | Freq: Two times a day (BID) | ORAL | 0 refills | Status: DC

## 2022-01-21 NOTE — ED Notes (Signed)
Pt TTSd and is now back in bed resting comfortably.
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
## 2022-01-21 NOTE — ED Provider Notes (Signed)
Emergency Medicine Observation Re-evaluation Note  Kelly Crosby is a 31 y.o. female, seen on rounds today.  Pt initially presented to the ED for complaints of Psychiatric Evaluation, Post-Traumatic Stress Disorder, and Depression Currently, the patient is resting comfortably.  Physical Exam  BP 116/74 (BP Location: Left Arm)   Pulse 75   Temp 98.1 F (36.7 C) (Oral)   Resp 15   SpO2 97%  Physical Exam Vitals and nursing note reviewed.  Constitutional:      General: She is not in acute distress.    Appearance: She is well-developed.  HENT:     Head: Normocephalic and atraumatic.  Eyes:     Conjunctiva/sclera: Conjunctivae normal.  Cardiovascular:     Rate and Rhythm: Normal rate and regular rhythm.     Heart sounds: No murmur heard. Pulmonary:     Effort: Pulmonary effort is normal. No respiratory distress.  Musculoskeletal:        General: No swelling.     Cervical back: Neck supple.  Skin:    General: Skin is warm and dry.     Capillary Refill: Capillary refill takes less than 2 seconds.  Neurological:     Mental Status: She is alert.  Psychiatric:        Mood and Affect: Mood normal.     ED Course / MDM  EKG:   I have reviewed the labs performed to date as well as medications administered while in observation.  Recent changes in the last 24 hours include psych clearance and discharge.  Plan  Current plan is for discharge.  Kelly Crosby is not under involuntary commitment.     Glendora Score, MD 01/21/22 1119
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
## 2022-01-21 NOTE — ED Notes (Signed)
Pt calm and cooperative, resting comfortably. Respirations even and unlabored. NAD noted at his time.

## 2022-01-21 NOTE — ED Triage Notes (Signed)
Pt reports she is here today due to psych eval. Pt denies any SI or HI but states that she needs to be evaluated.Pt reports that she has been off her meds for 1 week.

## 2022-01-21 NOTE — Discharge Instructions (Signed)
Follow-Ups: Go to [facility] (Behavioral Health); Please go to this provider for therapy and medication management services during walk in hours:  Mondays, Wednesday or Fridays, arrive by 7:30 am.  Services are provided on a first come, first served basis.   Discharge recommendations:  Patient is to take medications as prescribed. Please see information for follow-up appointment with psychiatry and therapy. Please follow up with your primary care provider for all medical related needs.   Therapy: We recommend that patient participate in individual therapy to address mental health concerns.  Medications: The parent/guardian is to contact a medical professional and/or outpatient provider to address any new side effects that develop. Parent/guardian should update outpatient providers of any new medications and/or medication changes.   Atypical antipsychotics: If you are prescribed an atypical antipsychotic, it is recommended that your height, weight, BMI, blood pressure, fasting lipid panel, and fasting blood sugar be monitored by your outpatient providers.  Safety:  The patient should abstain from use of illicit substances/drugs and abuse of any medications. If symptoms worsen or do not continue to improve or if the patient becomes actively suicidal or homicidal then it is recommended that the patient return to the closest hospital emergency department, the [facility], or call 911 for further evaluation and treatment. National Suicide Prevention Lifeline 1-800-SUICIDE or 1-800-273-8255.  About 988 988 offers 24/7 access to trained crisis counselors who can help people experiencing mental health-related distress. People can call or text 988 or chat 988lifeline.org for themselves or if they are worried about a loved one who may need crisis support.      

## 2022-01-22 ENCOUNTER — Other Ambulatory Visit: Payer: Self-pay

## 2022-01-22 ENCOUNTER — Ambulatory Visit (HOSPITAL_COMMUNITY)
Admission: EM | Admit: 2022-01-22 | Discharge: 2022-01-22 | Disposition: A | Discharge status: Home / Self Care | Payer: No Payment, Other | Attending: Nurse Practitioner | Admitting: Nurse Practitioner

## 2022-01-22 ENCOUNTER — Emergency Department (HOSPITAL_COMMUNITY)
Admission: EM | Admit: 2022-01-22 | Discharge: 2022-01-22 | Disposition: A | Discharge status: Home / Self Care | Payer: Medicaid Other | Attending: Emergency Medicine | Admitting: Emergency Medicine

## 2022-01-22 ENCOUNTER — Encounter (HOSPITAL_COMMUNITY): Payer: Self-pay

## 2022-01-22 DIAGNOSIS — F250 Schizoaffective disorder, bipolar type: Secondary | ICD-10-CM | POA: Insufficient documentation

## 2022-01-22 DIAGNOSIS — Z79899 Other long term (current) drug therapy: Secondary | ICD-10-CM | POA: Insufficient documentation

## 2022-01-22 DIAGNOSIS — F22 Delusional disorders: Secondary | ICD-10-CM

## 2022-01-22 DIAGNOSIS — R45851 Suicidal ideations: Secondary | ICD-10-CM | POA: Insufficient documentation

## 2022-01-22 DIAGNOSIS — F309 Manic episode, unspecified: Secondary | ICD-10-CM | POA: Insufficient documentation

## 2022-01-22 DIAGNOSIS — F1210 Cannabis abuse, uncomplicated: Secondary | ICD-10-CM | POA: Insufficient documentation

## 2022-01-22 DIAGNOSIS — Z91148 Patient's other noncompliance with medication regimen for other reason: Secondary | ICD-10-CM | POA: Insufficient documentation

## 2022-01-22 DIAGNOSIS — R45850 Homicidal ideations: Secondary | ICD-10-CM | POA: Insufficient documentation

## 2022-01-22 DIAGNOSIS — F4310 Post-traumatic stress disorder, unspecified: Secondary | ICD-10-CM | POA: Insufficient documentation

## 2022-01-22 DIAGNOSIS — Z5900 Homelessness unspecified: Secondary | ICD-10-CM | POA: Insufficient documentation

## 2022-01-22 DIAGNOSIS — Z91040 Latex allergy status: Secondary | ICD-10-CM | POA: Insufficient documentation

## 2022-01-22 LAB — CBC WITH DIFFERENTIAL/PLATELET
Abs Immature Granulocytes: 0.01 10*3/uL (ref 0.00–0.07)
Basophils Absolute: 0.1 10*3/uL (ref 0.0–0.1)
Basophils Relative: 1 %
Eosinophils Absolute: 0.2 10*3/uL (ref 0.0–0.5)
Eosinophils Relative: 3 %
HCT: 33.3 % — ABNORMAL LOW (ref 36.0–46.0)
Hemoglobin: 11.2 g/dL — ABNORMAL LOW (ref 12.0–15.0)
Immature Granulocytes: 0 %
Lymphocytes Relative: 43 %
Lymphs Abs: 3.8 10*3/uL (ref 0.7–4.0)
MCH: 27.8 pg (ref 26.0–34.0)
MCHC: 33.6 g/dL (ref 30.0–36.0)
MCV: 82.6 fL (ref 80.0–100.0)
Monocytes Absolute: 0.6 10*3/uL (ref 0.1–1.0)
Monocytes Relative: 7 %
Neutro Abs: 4 10*3/uL (ref 1.7–7.7)
Neutrophils Relative %: 46 %
Platelets: 349 10*3/uL (ref 150–400)
RBC: 4.03 MIL/uL (ref 3.87–5.11)
RDW: 14.4 % (ref 11.5–15.5)
WBC: 8.7 10*3/uL (ref 4.0–10.5)
nRBC: 0 % (ref 0.0–0.2)

## 2022-01-22 LAB — RAPID URINE DRUG SCREEN, HOSP PERFORMED
Amphetamines: NOT DETECTED
Barbiturates: NOT DETECTED
Benzodiazepines: NOT DETECTED
Cocaine: NOT DETECTED
Opiates: NOT DETECTED
Tetrahydrocannabinol: POSITIVE — AB

## 2022-01-22 LAB — COMPREHENSIVE METABOLIC PANEL
ALT: 16 U/L (ref 0–44)
AST: 14 U/L — ABNORMAL LOW (ref 15–41)
Albumin: 3.8 g/dL (ref 3.5–5.0)
Alkaline Phosphatase: 54 U/L (ref 38–126)
Anion gap: 11 (ref 5–15)
BUN: <5 mg/dL — ABNORMAL LOW (ref 6–20)
CO2: 25 mmol/L (ref 22–32)
Calcium: 9.3 mg/dL (ref 8.9–10.3)
Chloride: 105 mmol/L (ref 98–111)
Creatinine, Ser: 0.82 mg/dL (ref 0.44–1.00)
GFR, Estimated: >60 mL/min (ref >60)
Glucose, Bld: 89 mg/dL (ref 70–99)
Potassium: 3.4 mmol/L — ABNORMAL LOW (ref 3.5–5.1)
Sodium: 141 mmol/L (ref 135–145)
Total Bilirubin: 0.6 mg/dL (ref 0.3–1.2)
Total Protein: 7.3 g/dL (ref 6.5–8.1)

## 2022-01-22 LAB — I-STAT BETA HCG BLOOD, ED (MC, WL, AP ONLY): I-stat hCG, quantitative: <5 m[IU]/mL (ref <5)

## 2022-01-22 LAB — ETHANOL: Alcohol, Ethyl (B): <10 mg/dL (ref <10)

## 2022-01-22 NOTE — Discharge Instructions (Addendum)
  Assessment/Therapy Walk-Ins will be available on Monday and Wednesday's Only  Monday/Wednesday: 8 AM until slots are full Every 1st and 2nd Friday of the month: 1 pm - 5 pm  For Monday/Wednesday, it is recommended that patients arrive between 7:30 AM and 7:45 AM because patients will be seen in the order of arrival.  Go to the second floor on arrival and check in. For Fridays, it is recommended that patients arrive between 12 noon and 12:30 pm. Go to the second floor on arrival and check in.  **Availability is limited; therefore, patients may not be seen on the same day.**  Psychiatry/Medication Management Walk-Ins will be available Monday-Friday  Monday-Friday: 8 AM to 11 AM.  It is recommended that patients arrive by 7:30 AM to 7:45 AM because patients will be seen in the order of arrival.  Go to the second floor on arrival and check in.  **Availability is limited; therefore, patients may not be seen on the same day.**    =================================================================================== Discharge recommendations:  Patient is to take medications as prescribed. Please see information for follow-up appointment with psychiatry and therapy. Please follow up with your primary care provider for all medical related needs.   Therapy: We recommend that patient participate in individual therapy to address mental health concerns.  Medications: The parent/guardian is to contact a medical professional and/or outpatient provider to address any new side effects that develop. Parent/guardian should update outpatient providers of any new medications and/or medication changes.   Atypical antipsychotics: If you are prescribed an atypical antipsychotic, it is recommended that your height, weight, BMI, blood pressure, fasting lipid panel, and fasting blood sugar be monitored by your outpatient providers.  Safety:  The patient should abstain from use of illicit substances/drugs and  abuse of any medications. If symptoms worsen or do not continue to improve or if the patient becomes actively suicidal or homicidal then it is recommended that the patient return to the closest hospital emergency department, the [facility], or call 911 for further evaluation and treatment. National Suicide Prevention Lifeline 1-800-SUICIDE or (813) 386-2054.  About 988 988 offers 24/7 access to trained crisis counselors who can help people experiencing mental health-related distress. People can call or text 988 or chat 988lifeline.org for themselves or if they are worried about a loved one who may need crisis support.
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
## 2022-01-22 NOTE — ED Notes (Signed)
Cleared for discharge by provider. Pt is alert and oriented x 4. Pt was provided with a bus pass at this time.
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
## 2022-01-22 NOTE — Discharge Instructions (Signed)
Please use the list of behavioral health resources to follow-up with a psychiatrist and counselor in the outpatient setting.  Please pick up your prescription for Depakote and Zyprexa that was prescribed by the behavioral health urgent care.  Additionally I have given you resources for shelters in the area.  Get help right away if: You self-harm. You have serious thoughts about hurting yourself or others. You hallucinate.
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
## 2022-01-22 NOTE — ED Notes (Signed)
RN found pt walker. Cleared for discharge at this time. Wheeled to the waiting room by ED tech and bus pass provided to pt.
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
## 2022-02-01 ENCOUNTER — Emergency Department (HOSPITAL_COMMUNITY): Payer: No Typology Code available for payment source

## 2022-02-01 ENCOUNTER — Other Ambulatory Visit: Payer: Self-pay

## 2022-02-01 ENCOUNTER — Encounter (HOSPITAL_COMMUNITY): Payer: Self-pay | Admitting: Emergency Medicine

## 2022-02-01 ENCOUNTER — Emergency Department (HOSPITAL_COMMUNITY)
Admission: EM | Admit: 2022-02-01 | Discharge: 2022-02-01 | Disposition: A | Discharge status: Home / Self Care | Payer: No Typology Code available for payment source | Attending: Emergency Medicine | Admitting: Emergency Medicine

## 2022-02-01 ENCOUNTER — Emergency Department (HOSPITAL_BASED_OUTPATIENT_CLINIC_OR_DEPARTMENT_OTHER): Payer: No Typology Code available for payment source

## 2022-02-01 DIAGNOSIS — R52 Pain, unspecified: Secondary | ICD-10-CM

## 2022-02-01 DIAGNOSIS — Z91040 Latex allergy status: Secondary | ICD-10-CM | POA: Diagnosis not present

## 2022-02-01 DIAGNOSIS — M79605 Pain in left leg: Secondary | ICD-10-CM | POA: Diagnosis not present

## 2022-02-01 DIAGNOSIS — M25562 Pain in left knee: Secondary | ICD-10-CM | POA: Insufficient documentation

## 2022-02-01 LAB — POC URINE PREG, ED: Preg Test, Ur: NEGATIVE

## 2022-02-01 MED ORDER — KETOROLAC TROMETHAMINE 30 MG/ML IJ SOLN
30.0000 mg | Freq: Once | INTRAMUSCULAR | Status: AC
Start: 2022-02-01 — End: 2022-02-01
  Administered 2022-02-01: 30 mg via INTRAMUSCULAR
  Filled 2022-02-01: qty 1

## 2022-02-01 MED ORDER — ACETAMINOPHEN 325 MG PO TABS
650.0000 mg | ORAL_TABLET | Freq: Once | ORAL | Status: AC
  Administered 2022-02-01: 650 mg via ORAL
  Filled 2022-02-01: qty 2
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
## 2022-02-01 NOTE — ED Notes (Signed)
Pt transported to Vascular 

## 2022-02-12 ENCOUNTER — Encounter: Payer: Self-pay | Admitting: *Deleted

## 2022-02-12 NOTE — Congregational Nurse Program (Signed)
  Dept: [phone]   Congregational Nurse Program Note  Date of Encounter: 02/07/2022  Past Medical History: Past Medical History:  Diagnosis Date   A-fib [facility])    Anxiety    Depression    Schizotaxia     Encounter Details:  CNP Questionnaire - 02/07/22 0938       Questionnaire   Do you give verbal consent to treat you today? Yes    Location Patient Served  [facility]    Visit Setting Church or Organization    Patient Status Unknown    Insurance Uninsured ([employer]/Self-Pay)    Medication N/A    Medical Provider Yes    Screening Referrals N/A    Medical Referral Other   [facility]   Medical Appointment Made Other    Food N/A    Transportation N/A    Housing/Utilities No permanent housing    Interpersonal Safety N/A    Intervention Support    ED Visit Averted N/A    Life-Saving Intervention Made N/A            Client reports she is having leg discomfort and gabapentin is not working. She has an appt with [facility] 7/3. Discussed PT options with PCP. Referred to [facility]. Teea Ducey W RN CN

## 2022-02-19 ENCOUNTER — Ambulatory Visit (INDEPENDENT_AMBULATORY_CARE_PROVIDER_SITE_OTHER): Payer: Self-pay

## 2022-02-19 ENCOUNTER — Encounter (HOSPITAL_COMMUNITY): Payer: Self-pay

## 2022-02-19 ENCOUNTER — Ambulatory Visit (HOSPITAL_COMMUNITY)
Admission: EM | Admit: 2022-02-19 | Discharge: 2022-02-19 | Disposition: A | Discharge status: Home / Self Care | Payer: Self-pay

## 2022-02-19 DIAGNOSIS — M25562 Pain in left knee: Secondary | ICD-10-CM

## 2022-02-19 DIAGNOSIS — W19XXXA Unspecified fall, initial encounter: Secondary | ICD-10-CM
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
## 2022-02-19 NOTE — Discharge Instructions (Signed)
Continue to work on getting into your physical therapy clinic. Make sure to use your walker at all times to be safe.
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
## 2022-03-10 ENCOUNTER — Encounter: Payer: Self-pay | Admitting: *Deleted

## 2022-03-10 NOTE — Congregational Nurse Program (Signed)
  Dept: [phone]   Congregational Nurse Program Note  Date of Encounter: 03/10/2022  Past Medical History: Past Medical History:  Diagnosis Date   A-fib [facility])    Anxiety    Depression    Schizotaxia     Encounter Details:  CNP Questionnaire - 03/10/22 1420       Questionnaire   Do you give verbal consent to treat you today? Yes    Location Patient Served  [facility]    Visit Setting Church or Organization    Patient Status Homeless    Insurance Uninsured ([employer] Card/Care Connects/Self-Pay)    Insurance Referral N/A    Medication N/A    Medical Provider Yes    Screening Referrals N/A    Medical Referral N/A   [facility]   Medical Appointment Made Other    Food N/A    Transportation Need transportation assistance;Provided transportation assistance    Housing/Utilities No permanent housing    Interpersonal Safety N/A    Intervention Support    ED Visit Averted N/A    Life-Saving Intervention Made N/A            Client came to nurse's office requesting transportation help to [facility] appt 03/12/22 at 3:00. Set up [employer] for ride to and from appt.  Adynn Caseres W RN CN

## 2022-04-09 ENCOUNTER — Encounter: Payer: Self-pay | Admitting: *Deleted

## 2022-04-09 NOTE — Congregational Nurse Program (Signed)
  Dept: [phone]   Congregational Nurse Program Note  Date of Encounter: 04/09/2022  Past Medical History: Past Medical History:  Diagnosis Date   A-fib [facility])    Anxiety    Depression    Schizotaxia     Encounter Details:  CNP Questionnaire - 04/09/22 1419       Questionnaire   Do you give verbal consent to treat you today? Yes    Location Patient Served  [facility]    Visit Setting Church or Organization    Patient Status Homeless    Insurance Uninsured ([employer] Card/Care Connects/Self-Pay)    Insurance Referral N/A    Medication N/A    Medical Provider Yes    Screening Referrals N/A    Medical Referral N/A   [facility]   Medical Appointment Made Other    Food N/A    Transportation Need transportation assistance;Provided transportation assistance    Housing/Utilities No permanent housing    Interpersonal Safety N/A    Intervention Support    ED Visit Averted N/A    Life-Saving Intervention Made N/A            Client came to nurses office requesting help with transportation to [facility] appt Sept 6 at 3:15. Client has applied for an orange card and is waiting on acceptance. Set up [employer] transportation with [facility] to p/u at 2:30 [facility]. Ohana Birdwell W RN CN

## 2022-05-02 ENCOUNTER — Encounter: Payer: Self-pay | Admitting: *Deleted

## 2022-05-02 NOTE — Congregational Nurse Program (Signed)
  Dept: [phone]   Congregational Nurse Program Note  Date of Encounter: 05/02/2022  Past Medical History: Past Medical History:  Diagnosis Date   A-fib [facility])    Anxiety    Depression    Schizotaxia     Encounter Details:  CNP Questionnaire - 05/02/22 1428       Questionnaire   Do you give verbal consent to treat you today? Yes    Location Patient Served  [facility]    Visit Setting Phone/Text/Email    Patient Status Homeless    Insurance Uninsured ([employer] Card/Care Connects/Self-Pay)    Insurance Referral N/A    Medication N/A    Medical Provider Yes    Screening Referrals N/A    Medical Referral N/A   [facility]   Medical Appointment Made Other    Food N/A    Transportation Need transportation assistance;Provided transportation assistance    Housing/Utilities No permanent housing    Interpersonal Safety N/A    Intervention Support    ED Visit Averted N/A    Life-Saving Intervention Made N/A            [facility] SW requesting help getting client to PT appt. Set up transportation from [facility] to [place] [facility] for 05/07/22 with pick up time at 3:30 for 4:00 appt. Client must be back at [place] before curfew. Mickie Kozikowski W RN CN

## 2022-05-05 ENCOUNTER — Ambulatory Visit (INDEPENDENT_AMBULATORY_CARE_PROVIDER_SITE_OTHER): Payer: Federal, State, Local not specified - Other | Admitting: Mental Health

## 2022-05-05 DIAGNOSIS — F4310 Post-traumatic stress disorder, unspecified: Secondary | ICD-10-CM

## 2022-05-05 DIAGNOSIS — F1220 Cannabis dependence, uncomplicated: Secondary | ICD-10-CM

## 2022-05-05 DIAGNOSIS — F250 Schizoaffective disorder, bipolar type: Secondary | ICD-10-CM | POA: Diagnosis not present

## 2022-05-05 DIAGNOSIS — F419 Anxiety disorder, unspecified: Secondary | ICD-10-CM

## 2022-05-06 ENCOUNTER — Encounter (HOSPITAL_COMMUNITY): Payer: Self-pay

## 2022-05-06 NOTE — Plan of Care (Signed)
  Problem: Bipolar Disorder CCP Problem  1  Goal: LTG: Stabilize mood and increase goal-directed behavior AED self-report with no inpatient hospitalizations  Outcome: Initial Goal: STG: Hallie increase management of moods AEB development of x 3 effective coping skills for moods within the next 6 months  Outcome: Initial   Problem: Anxiety Disorder CCP Problem  1  Goal: LTG: [place] will score less than 5 on the Generalized Anxiety Disorder 7 Scale (GAD-7) Outcome: Initial Goal: STG: [place] will reduce feelings of stress/anxiety AEB development of effective problem solving skills with engagement daily in relaxation/grounding skills within the next 6 months  Outcome: Initial
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
## 2022-05-13 ENCOUNTER — Ambulatory Visit (HOSPITAL_COMMUNITY): Payer: No Payment, Other | Admitting: Student in an Organized Health Care Education/Training Program

## 2022-05-20 ENCOUNTER — Ambulatory Visit (HOSPITAL_COMMUNITY): Payer: No Payment, Other | Admitting: Student in an Organized Health Care Education/Training Program

## 2022-05-22 ENCOUNTER — Telehealth (HOSPITAL_COMMUNITY): Payer: Self-pay | Admitting: Mental Health

## 2022-05-22 ENCOUNTER — Ambulatory Visit (HOSPITAL_COMMUNITY): Payer: No Payment, Other | Admitting: Mental Health

## 2022-05-22 NOTE — Telephone Encounter (Signed)
Therapist contacted pt via telephone to offer transfer to virtual session. No answer, with continual ring with no voicemail.

## 2022-12-11 ENCOUNTER — Encounter (HOSPITAL_COMMUNITY): Payer: Self-pay

## 2022-12-11 ENCOUNTER — Ambulatory Visit (INDEPENDENT_AMBULATORY_CARE_PROVIDER_SITE_OTHER): Payer: Medicaid Other | Admitting: Mental Health

## 2022-12-11 DIAGNOSIS — F250 Schizoaffective disorder, bipolar type: Secondary | ICD-10-CM

## 2022-12-11 DIAGNOSIS — F419 Anxiety disorder, unspecified: Secondary | ICD-10-CM

## 2022-12-11 DIAGNOSIS — F1220 Cannabis dependence, uncomplicated: Secondary | ICD-10-CM

## 2022-12-11 DIAGNOSIS — F4310 Post-traumatic stress disorder, unspecified: Secondary | ICD-10-CM

## 2022-12-11 NOTE — Progress Notes (Signed)
   THERAPIST PROGRESS NOTE  Session Time: 8:06 am ( 44 minutes)   Participation Level: Active  Behavioral Response: CasualAlertEuthymic  Type of Therapy: Individual Therapy  Treatment Goals addressed: STG: "Verbally express myself. More productive. Routine."Juanell will maintain stability with mental health AEB engagement in healthy habits daily with ability to communicate thoughts and feelings effectively within the next 90 days.   STG: Javaya will reduce feelings of stress/anxiety AEB development of effective problem solving skills with engagement daily in relaxation/grounding skills within the next 6 months     ProgressTowards Goals: Initial  Interventions: Supportive  Summary: Corina Stacy Frew is a 32 y.o. female who presents for walk in appointment with dx of schizoaffective disorder bipolar type, PTSD and cannabis use. Presents for walk in session with no engagement since initial intake assessment completed 04/2022.  Chief complaint of low energy and at times feeling overwhelmed. Shares since asmt has been doing well, no longer living in shelter and has been able to maintain school course load with 3.7GPA with anticipated graduation date of 03/2025. Reports good relationship with mother and daughter with daugher's birthday approaching. Shares would like to one day obtain custody back from mother in which mother in in agreement as long as she continues to do well and make progress. Reports to be looking from work from home positions as in current program that will cover cost of rent and utilities at this time. 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 Shares to feel as if mental health is currently stable and has not taken medications since late last year. Reports for sleep and appetite to be adequate. Agrees to work on increasing ability to manage feelings of being overwhelmed, increasing  healthy habits and routine. Agrees to work on setting small daily goals.   Suicidal/Homicidal: Nowithout intent/plan  Therapist Response: Therapist engaged Pt in therapy session. Completed check and and reviewed current level of functioning sxs management and level of stressors. Provided safe space for [place] to share thoughts and feelings regarding current level of functioning and factors that have supported in ongoing improvements. Assessed for medication compliance and ability to make appointment and present for walk in to re-engage if needed. Encouraged if sxs increase/return unable to cope to contact office to restart medications. Engaged in exploring areas in need of progress and barriers to progress. Discussed treatment plan and completed.   Plan: Return again in  x 4 weeks.  Diagnosis: Schizoaffective disorder, bipolar type (HCC)  PTSD (post-traumatic stress disorder)  Anxiety  Cannabis use disorder, severe, dependence (HCC)  Collaboration of Care: Other None  Patient/Guardian was advised Release of Information must be obtained prior to any record release in order to collaborate their care with an outside provider. Patient/Guardian was advised if they have not already done so to contact the registration department to sign all necessary forms in order for [place] to release information regarding their care.   Consent: Patient/Guardian gives verbal consent for treatment and assignment of benefits for services provided during this visit. Patient/Guardian expressed understanding and agreed to proceed.   Stephan Minister Dunn L[place], [facility] 12/11/2022

## 2022-12-31 ENCOUNTER — Ambulatory Visit: Payer: Medicaid Other | Admitting: Nurse Practitioner

## 2022-12-31 NOTE — Progress Notes (Deleted)
Hershal Coria Aaran Enberg,acting as a Neurosurgeon for Arnette Felts, FNP.,have documented all relevant documentation on the behalf of Arnette Felts, FNP,as directed by  Arnette Felts, FNP while in the presence of Arnette Felts, FNP.    Subjective:     Patient ID: Kelly Crosby , female    DOB: Sep 12, 1990 , 32 y.o.   MRN: [id number]   No chief complaint on file.   HPI  Patient presents today to establish care, patient reports compliance with medications. Patient would like to discuss    Past Medical History:  Diagnosis Date  . A-fib (HCC)   . Anxiety   . Depression   . Schizotaxia      Family History  Problem Relation Age of Onset  . Hypertension Other   . Hypertension Mother   . Post-traumatic stress disorder Father      Current Outpatient Medications:  .  albuterol (VENTOLIN HFA) 108 (90 Base) MCG/ACT inhaler, Inhale 1 puff into the lungs every 4 (four) hours as needed for wheezing or shortness of breath., Disp: , Rfl:  .  cephALEXin (KEFLEX) 500 MG capsule, Take 500 mg by mouth See admin instructions. Bid x 7 days (Patient not taking: Reported on 02/19/2022), Disp: , Rfl:  .  DEPAKOTE 500 MG DR tablet, Take 1 tablet (500 mg total) by mouth 2 (two) times daily for 14 days. (Patient not taking: Reported on 02/19/2022), Disp: 28 tablet, Rfl: 0 .  gabapentin (NEURONTIN) 300 MG capsule, Take 300 mg by mouth 3 (three) times daily., Disp: , Rfl:  .  ibuprofen (ADVIL) 200 MG tablet, Take 200 mg by mouth every 6 (six) hours as needed for headache or moderate pain., Disp: , Rfl:  .  naproxen sodium (ALEVE) 220 MG tablet, Take 220 mg by mouth daily as needed (pain/headache). (Patient not taking: Reported on 02/19/2022), Disp: , Rfl:  .  OLANZapine (ZYPREXA) 5 MG tablet, Take 1 tablet (5 mg total) by mouth 2 (two) times daily for 15 days. (Patient not taking: Reported on 02/19/2022), Disp: 28 tablet, Rfl: 0 .  ondansetron (ZOFRAN) 4 MG tablet, Take 4 mg by mouth 3 (three) times daily as needed for nausea  or vomiting. (Patient not taking: Reported on 02/19/2022), Disp: , Rfl:    Allergies  Allergen Reactions  . Latex Itching and Other (See Comments)    Causes bleeding  . Oat Other (See Comments)    "Throat tingling"  . Amoxicillin Other (See Comments) and Rash    "Yeast Infection"  . Mustard Seed Itching and Rash  . Other Itching and Rash    bleach     Review of Systems   There were no vitals filed for this visit. There is no height or weight on file to calculate BMI.  The ASCVD Risk score (Arnett DK, et al., 2019) failed to calculate for the following reasons:   The 2019 ASCVD risk score is only valid for ages 41 to 37 ++ Objective:  Physical Exam      Assessment And Plan:     1. Establishing care with new doctor, encounter for  2. Schizoaffective disorder, unspecified type (HCC)  3. Anxiety  4. Cannabis use disorder, severe, dependence (HCC)  5. Personality disorder (HCC)  6. PTSD (post-traumatic stress disorder)  7. Suicidal ideations    No follow-ups on file.  Patient was given opportunity to ask questions. Patient verbalized understanding of the plan and was able to repeat key elements of the plan. All questions were answered to  their satisfaction.  Marlyn Corporal, CMA   I, Marlyn Corporal, CMA, have reviewed all documentation for this visit. The documentation on 12/31/22 for the exam, diagnosis, procedures, and orders are all accurate and complete.   IF YOU HAVE BEEN REFERRED TO A SPECIALIST, IT MAY TAKE 1-2 WEEKS TO SCHEDULE/PROCESS THE REFERRAL. IF YOU HAVE NOT HEARD FROM US/SPECIALIST IN TWO WEEKS, PLEASE GIVE [place] A CALL AT [phone].   THE PATIENT IS ENCOURAGED TO PRACTICE SOCIAL DISTANCING DUE TO THE COVID-19 PANDEMIC.

## 2023-01-12 ENCOUNTER — Ambulatory Visit (HOSPITAL_COMMUNITY): Payer: Medicaid Other | Admitting: Mental Health

## 2023-01-12 DIAGNOSIS — F250 Schizoaffective disorder, bipolar type: Secondary | ICD-10-CM

## 2023-01-12 NOTE — Progress Notes (Signed)
Therapist connected to pt via tele-health platform. Pt reported to have thought appointment was on upcoming Friday. Currently driving with others in vehicle. Reports to be doing well and for moods to be stable. Sleep adequate. Continues to be in school and looking for work from home employment. Rescheduled for 7/1 @ 4pm.

## 2023-01-19 ENCOUNTER — Encounter: Payer: Medicaid Other | Admitting: Internal Medicine

## 2023-01-19 NOTE — Progress Notes (Unsigned)
[facility] [address] [place] [address] Dept: [phone] Dept Fax: [phone]  New Patient Office Visit  Subjective:   Kelly Crosby 10-22-1990 01/19/2023  No chief complaint on file.   HPI: Kelly Crosby presents today to establish care at [facility]. Introduced to Publishing rights manager role and practice setting.  All questions answered.   Last PCP: Lavinia Sharps Last annual physical: *** Concerns: pap smear and std testing.    The following portions of the patient's history were reviewed and updated as appropriate: past medical history, past surgical history, family history, social history, allergies, medications, and problem list.   Patient Active Problem List   Diagnosis Date Noted   Suicidal ideations 01/21/2022   Anxiety 11/09/2021   Obesity 11/09/2021   PTSD (post-traumatic stress disorder) 11/09/2021   Personality disorder (HCC) 07/14/2018   Schizoaffective disorder (HCC) 09/26/2016   Cannabis use disorder, severe, dependence (HCC) 09/26/2016   Past Medical History:  Diagnosis Date   A-fib (HCC)    Anxiety    Depression    Schizotaxia    Past Surgical History:  Procedure Laterality Date   NO PAST SURGERIES     Family History  Problem Relation Age of Onset   Hypertension Other    Hypertension Mother    Post-traumatic stress disorder Father    Outpatient Medications Prior to Visit  Medication Sig Dispense Refill   albuterol (VENTOLIN HFA) 108 (90 Base) MCG/ACT inhaler Inhale 1 puff into the lungs every 4 (four) hours as needed for wheezing or shortness of breath.     cephALEXin (KEFLEX) 500 MG capsule Take 500 mg by mouth See admin instructions. Bid x 7 days (Patient not taking: Reported on 02/19/2022)     DEPAKOTE 500 MG DR tablet Take 1 tablet (500 mg total) by mouth 2 (two) times daily for 14 days. (Patient not taking: Reported on 02/19/2022) 28 tablet 0    gabapentin (NEURONTIN) 300 MG capsule Take 300 mg by mouth 3 (three) times daily.     ibuprofen (ADVIL) 200 MG tablet Take 200 mg by mouth every 6 (six) hours as needed for headache or moderate pain.     naproxen sodium (ALEVE) 220 MG tablet Take 220 mg by mouth daily as needed (pain/headache). (Patient not taking: Reported on 02/19/2022)     OLANZapine (ZYPREXA) 5 MG tablet Take 1 tablet (5 mg total) by mouth 2 (two) times daily for 15 days. (Patient not taking: Reported on 02/19/2022) 28 tablet 0   ondansetron (ZOFRAN) 4 MG tablet Take 4 mg by mouth 3 (three) times daily as needed for nausea or vomiting. (Patient not taking: Reported on 02/19/2022)     No facility-administered medications prior to visit.   Allergies  Allergen Reactions   Latex Itching and Other (See Comments)    Causes bleeding   Oat Other (See Comments)    "Throat tingling"   Amoxicillin Other (See Comments) and Rash    "Yeast Infection"   Mustard Seed Itching and Rash   Other Itching and Rash    bleach    ROS: A complete ROS was performed with pertinent positives/negatives noted in the HPI. The remainder of the ROS are negative.   Objective:   There were no vitals filed for this visit.  GENERAL: Well-appearing, in NAD. Well nourished.  SKIN: Pink, warm and dry. No rash, lesion, ulceration, or ecchymoses.  NECK: Trachea midline. Full ROM w/o pain or tenderness. No  lymphadenopathy.  RESPIRATORY: Chest wall symmetrical. Respirations even and non-labored. Breath sounds clear to auscultation bilaterally.  CARDIAC: S1, S2 present, regular rate and rhythm. Peripheral pulses 2+ bilaterally.  MSK: Muscle tone and strength appropriate for age. Joints w/o tenderness, redness, or swelling.  EXTREMITIES: Without clubbing, cyanosis, or edema.  NEUROLOGIC: No motor or sensory deficits. Steady, even gait.  PSYCH/MENTAL STATUS: Alert, oriented x 3. Cooperative, appropriate mood and affect.   Health Maintenance Due  Topic Date  Due   Hepatitis C Screening  Never done   PAP SMEAR-Modifier  08/29/2022    Results for orders placed or performed in visit on 01/19/23  HM PAP SMEAR  Result Value Ref Range   HM Pap smear 08/30/19   HM HIV SCREENING LAB  Result Value Ref Range   HM HIV Screening Negative - Validated        Assessment & Plan:  There are no diagnoses linked to this encounter.   No follow-ups on file.   Salvatore Decent, FNP

## 2023-01-20 ENCOUNTER — Ambulatory Visit: Payer: No Typology Code available for payment source | Admitting: Internal Medicine

## 2023-01-20 NOTE — Patient Instructions (Addendum)
Erroneous encounter

## 2023-01-20 NOTE — Progress Notes (Signed)
This encounter was created in error - please disregard.

## 2023-01-23 NOTE — Progress Notes (Deleted)
[facility] [address] [place] [address] Dept: [phone] Dept Fax: [phone]  New Patient Office Visit  Subjective:   Kelly Crosby [birth year] 01/26/2023  No chief complaint on file.   HPI: Vertis Valliant Patron presents today to establish care at [facility]. Introduced to Publishing rights manager role and practice setting.  All questions answered.   Last PCP: mary placey NP  Last annual physical: *** Concerns: See below   STD SCREENING: [place] S Ury presents for STD screening.   Sexual activity:  {Blank single:19197::"Not sexually active","In a Monogamous Relationship","Practices careful partner selection, always uses condoms","Recent unprotected sexual encounter"} Contraception: {Blank single:19197::"yes","no"} Recent unprotected intercourse: {Blank single:19197::"yes","no"} Recent known exposure to STD's: {Blank single:19197::"yes","no"} History of sexually transmitted diseases: {Blank single:19197::"yes","no"}  Genital lesions: {Blank single:19197::"yes","no"} Genital discharge: {Blank single:19197::"yes","no"} Dysuria: {Blank single:19197::"yes","no"} Swollen lymph nodes: {Blank single:19197::"yes","no"} Fevers: {Blank single:19197::"yes","no"} Rash: {Blank single:19197::"yes","no"}     The following portions of the patient's history were reviewed and updated as appropriate: past medical history, past surgical history, family history, social history, allergies, medications, and problem list.   Patient Active Problem List   Diagnosis Date Noted   Suicidal ideations 01/21/2022   Anxiety 11/09/2021   Obesity 11/09/2021   PTSD (post-traumatic stress disorder) 11/09/2021   Personality disorder (HCC) 07/14/2018   Schizoaffective disorder (HCC) 09/26/2016   Cannabis use disorder, severe, dependence (HCC) 09/26/2016   Past Medical History:  Diagnosis Date   A-fib (HCC)     Anxiety    Depression    Schizotaxia    Past Surgical History:  Procedure Laterality Date   NO PAST SURGERIES     Family History  Problem Relation Age of Onset   Hypertension Other    Hypertension Mother    Post-traumatic stress disorder Father    Outpatient Medications Prior to Visit  Medication Sig Dispense Refill   albuterol (VENTOLIN HFA) 108 (90 Base) MCG/ACT inhaler Inhale 1 puff into the lungs every 4 (four) hours as needed for wheezing or shortness of breath.     cephALEXin (KEFLEX) 500 MG capsule Take 500 mg by mouth See admin instructions. Bid x 7 days (Patient not taking: Reported on 02/19/2022)     DEPAKOTE 500 MG DR tablet Take 1 tablet (500 mg total) by mouth 2 (two) times daily for 14 days. (Patient not taking: Reported on 02/19/2022) 28 tablet 0   gabapentin (NEURONTIN) 300 MG capsule Take 300 mg by mouth 3 (three) times daily.     ibuprofen (ADVIL) 200 MG tablet Take 200 mg by mouth every 6 (six) hours as needed for headache or moderate pain.     naproxen sodium (ALEVE) 220 MG tablet Take 220 mg by mouth daily as needed (pain/headache). (Patient not taking: Reported on 02/19/2022)     OLANZapine (ZYPREXA) 5 MG tablet Take 1 tablet (5 mg total) by mouth 2 (two) times daily for 15 days. (Patient not taking: Reported on 02/19/2022) 28 tablet 0   ondansetron (ZOFRAN) 4 MG tablet Take 4 mg by mouth 3 (three) times daily as needed for nausea or vomiting. (Patient not taking: Reported on 02/19/2022)     No facility-administered medications prior to visit.   Allergies  Allergen Reactions   Latex Itching and Other (See Comments)    Causes bleeding   Oat Other (See Comments)    "Throat tingling"   Amoxicillin Other (See Comments) and Rash    "Yeast Infection"   Mustard Seed Itching and Rash  Other Itching and Rash    bleach    ROS: A complete ROS was performed with pertinent positives/negatives noted in the HPI. The remainder of the ROS are negative.   Objective:    There were no vitals filed for this visit.  GENERAL: Well-appearing, in NAD. Well nourished.  SKIN: Pink, warm and dry. No rash, lesion, ulceration, or ecchymoses.  NECK: Trachea midline. Full ROM w/o pain or tenderness. No lymphadenopathy.  RESPIRATORY: Chest wall symmetrical. Respirations even and non-labored. Breath sounds clear to auscultation bilaterally.  CARDIAC: S1, S2 present, regular rate and rhythm. Peripheral pulses 2+ bilaterally.  MSK: Muscle tone and strength appropriate for age. Joints w/o tenderness, redness, or swelling.  EXTREMITIES: Without clubbing, cyanosis, or edema.  NEUROLOGIC: No motor or sensory deficits. Steady, even gait.  PSYCH/MENTAL STATUS: Alert, oriented x 3. Cooperative, appropriate mood and affect.   Health Maintenance Due  Topic Date Due   Hepatitis C Screening  Never done   PAP SMEAR-Modifier  08/29/2022    No results found for any visits on 01/26/23.     Assessment & Plan:  There are no diagnoses linked to this encounter.   No follow-ups on file.   Salvatore Decent, FNP

## 2023-01-26 ENCOUNTER — Ambulatory Visit: Payer: Medicaid Other | Admitting: Internal Medicine

## 2023-01-26 ENCOUNTER — Telehealth: Payer: Self-pay | Admitting: *Deleted

## 2023-01-26 DIAGNOSIS — Z113 Encounter for screening for infections with a predominantly sexual mode of transmission: Secondary | ICD-10-CM

## 2023-01-26 NOTE — Telephone Encounter (Signed)
Pt was a no show for a new patient appointment with Morrie Sheldon on 01/26/23, I did not send a letter.

## 2023-01-27 NOTE — Telephone Encounter (Signed)
no show, prior late cancel due to transportation, unable to reschedule at [employer]

## 2023-01-28 ENCOUNTER — Other Ambulatory Visit (HOSPITAL_COMMUNITY)
Admission: RE | Admit: 2023-01-28 | Discharge: 2023-01-28 | Disposition: A | Discharge status: Home / Self Care | Payer: Medicaid Other | Source: Ambulatory Visit | Attending: Family Medicine | Admitting: Family Medicine

## 2023-01-28 ENCOUNTER — Encounter: Payer: Self-pay | Admitting: Obstetrics and Gynecology

## 2023-01-28 ENCOUNTER — Other Ambulatory Visit: Payer: Self-pay

## 2023-01-28 ENCOUNTER — Ambulatory Visit (INDEPENDENT_AMBULATORY_CARE_PROVIDER_SITE_OTHER): Payer: Medicaid Other | Admitting: Obstetrics and Gynecology

## 2023-01-28 VITALS — BP 109/86 | HR 69 | Ht 66.0 in | Wt 279.0 lb

## 2023-01-28 DIAGNOSIS — Z0000 Encounter for general adult medical examination without abnormal findings: Secondary | ICD-10-CM

## 2023-01-28 DIAGNOSIS — Z113 Encounter for screening for infections with a predominantly sexual mode of transmission: Secondary | ICD-10-CM | POA: Diagnosis not present

## 2023-01-28 DIAGNOSIS — N926 Irregular menstruation, unspecified: Secondary | ICD-10-CM

## 2023-01-28 DIAGNOSIS — Z01419 Encounter for gynecological examination (general) (routine) without abnormal findings: Secondary | ICD-10-CM | POA: Diagnosis present

## 2023-01-28 DIAGNOSIS — Z30011 Encounter for initial prescription of contraceptive pills: Secondary | ICD-10-CM | POA: Diagnosis not present

## 2023-01-28 MED ORDER — NORETHIN ACE-ETH ESTRAD-FE 1-20 MG-MCG(24) PO TABS
1.0000 | ORAL_TABLET | Freq: Every day | ORAL | 11 refills | Status: DC
Start: 2023-01-28 — End: 2023-10-05

## 2023-01-28 NOTE — Progress Notes (Signed)
GYNECOLOGY ANNUAL PREVENTATIVE CARE ENCOUNTER NOTE  History:     Kelly Crosby is a 32 y.o. [id number] female here for a routine annual gynecologic exam.  Current complaints: irregular menses. Pt states she has had 4 menses since March.  The patient does admit recent unprotected intercourse the last few weeks.  Denies discharge, pelvic pain, problems with intercourse or other gynecologic concerns.    Gynecologic History No LMP recorded. Patient has had an injection. Contraception: none Last Pap: unknown.  Last mammogram: n/a.   Obstetric History OB History  Gravida Para Term Preterm AB Living  1 1 1     1   SAB IAB Ectopic Multiple Live Births          1    # Outcome Date GA Lbr Len/2nd Weight Sex Delivery Anes PTL Lv  1 Term             Obstetric Comments  SVD x 1    Past Medical History:  Diagnosis Date   A-fib (HCC)    Anxiety    Depression    Schizotaxia     Past Surgical History:  Procedure Laterality Date   NO PAST SURGERIES      Current Outpatient Medications on File Prior to Visit  Medication Sig Dispense Refill   albuterol (VENTOLIN HFA) 108 (90 Base) MCG/ACT inhaler Inhale 1 puff into the lungs every 4 (four) hours as needed for wheezing or shortness of breath.     cephALEXin (KEFLEX) 500 MG capsule Take 500 mg by mouth See admin instructions. Bid x 7 days (Patient not taking: Reported on 02/19/2022)     DEPAKOTE 500 MG DR tablet Take 1 tablet (500 mg total) by mouth 2 (two) times daily for 14 days. (Patient not taking: Reported on 02/19/2022) 28 tablet 0   gabapentin (NEURONTIN) 300 MG capsule Take 300 mg by mouth 3 (three) times daily.     ibuprofen (ADVIL) 200 MG tablet Take 200 mg by mouth every 6 (six) hours as needed for headache or moderate pain.     naproxen sodium (ALEVE) 220 MG tablet Take 220 mg by mouth daily as needed (pain/headache). (Patient not taking: Reported on 02/19/2022)     OLANZapine (ZYPREXA) 5 MG tablet Take 1 tablet (5 mg total) by  mouth 2 (two) times daily for 15 days. (Patient not taking: Reported on 02/19/2022) 28 tablet 0   ondansetron (ZOFRAN) 4 MG tablet Take 4 mg by mouth 3 (three) times daily as needed for nausea or vomiting. (Patient not taking: Reported on 02/19/2022)     No current facility-administered medications on file prior to visit.    Allergies  Allergen Reactions   Latex Itching and Other (See Comments)    Causes bleeding   Oat Other (See Comments)    "Throat tingling"   Amoxicillin Other (See Comments) and Rash    "Yeast Infection"   Mustard Itching and Rash   Other Itching and Rash    bleach    Social History:  reports that she has been smoking cigarettes and e-cigarettes. She has been smoking an average of 1 pack per day. She has never used smokeless tobacco. She reports current alcohol use. She reports current drug use. Drug: Marijuana.  Family History  Problem Relation Age of Onset   Hypertension Other    Hypertension Mother    Post-traumatic stress disorder Father     The following portions of the patient's history were reviewed and updated as appropriate: allergies,  current medications, past family history, past medical history, past social history, past surgical history and problem list.  Review of Systems Pertinent items noted in HPI and remainder of comprehensive ROS otherwise negative.  Physical Exam:  BP 109/86 (BP Location: Left Arm, Patient Position: Sitting, Cuff Size: Large)   Pulse 69   Ht 5\' 6"  (1.676 m)   Wt 279 lb (126.6 kg)   BMI 45.03 kg/m  CONSTITUTIONAL: Well-developed, well-nourished female in no acute distress.  HENT:  Normocephalic, atraumatic, External right and left ear normal. Oropharynx is clear and moist EYES: Conjunctivae and EOM are normal.  NECK: Normal range of motion, supple, no masses.  Normal thyroid.  SKIN: Skin is warm and dry. No rash noted. Not diaphoretic. No erythema. No pallor. MUSCULOSKELETAL: Normal range of motion. No tenderness.  No  cyanosis, clubbing, or edema.  2+ distal pulses. NEUROLOGIC: Alert and oriented to person, place, and time. Normal reflexes, muscle tone coordination.  PSYCHIATRIC: Normal mood and affect. Normal behavior. Normal judgment and thought content. CARDIOVASCULAR: Normal heart rate noted, regular rhythm RESPIRATORY: Clear to auscultation bilaterally. Effort and breath sounds normal, no problems with respiration noted. BREASTS: deferred ABDOMEN: Soft, no distention noted.  No tenderness, rebound or guarding.  PELVIC: Normal appearing external genitalia and urethral meatus; normal appearing vaginal mucosa and cervix.  No abnormal discharge noted.  Pap smear obtained.  Vaginal swab obtained. Normal uterine size, no other palpable masses, no uterine or adnexal tenderness.  Performed in the presence of a chaperone.   Assessment and Plan:    1. Women's annual routine gynecological examination Normal annual exam - Cytology - PAP  2. Routine screening for STI (sexually transmitted infection) Per pt request  - Cervicovaginal ancillary only - Hepatitis B surface antigen - Hepatitis C antibody - HIV Antibody (routine testing w rflx) - RPR  3. Irregular menses If serum pregnancy test is negative, pt can start OCP at next menses - Beta hCG quant (ref lab)  4. BCP (birth control pills) initiation Pt denies any hx of DVT/PE.  No issues taking OCP previously  - Norethindrone Acetate-Ethinyl Estrad-FE (LOESTRIN 24 FE) 1-20 MG-MCG(24) tablet; Take 1 tablet by mouth daily.  Dispense: 28 tablet; Refill: 11  Will follow up results of pap smear and manage accordingly. Routine preventative health maintenance measures emphasized. Please refer to After Visit Summary for other counseling recommendations.     F/u in 4 months to reeval bleeding pattern. Mariel Aloe, MD, FACOG Obstetrician & Gynecologist, [facility] for [facility], [facility]

## 2023-01-29 LAB — HEPATITIS C ANTIBODY: Hep C Virus Ab: NONREACTIVE

## 2023-01-29 LAB — RPR: RPR Ser Ql: NONREACTIVE

## 2023-01-29 LAB — BETA HCG QUANT (REF LAB): hCG Quant: <1 m[IU]/mL

## 2023-01-29 LAB — HIV ANTIBODY (ROUTINE TESTING W REFLEX): HIV Screen 4th Generation wRfx: NONREACTIVE

## 2023-01-29 LAB — HEPATITIS B SURFACE ANTIGEN: Hepatitis B Surface Ag: NEGATIVE

## 2023-01-30 ENCOUNTER — Other Ambulatory Visit: Payer: Self-pay | Admitting: Obstetrics and Gynecology

## 2023-01-30 DIAGNOSIS — N760 Acute vaginitis: Secondary | ICD-10-CM

## 2023-01-30 DIAGNOSIS — A599 Trichomoniasis, unspecified: Secondary | ICD-10-CM

## 2023-01-30 LAB — CERVICOVAGINAL ANCILLARY ONLY
Bacterial Vaginitis (gardnerella): POSITIVE — AB
Candida Glabrata: NEGATIVE
Candida Vaginitis: NEGATIVE
Chlamydia: NEGATIVE
Comment: NEGATIVE
Comment: NEGATIVE
Comment: NEGATIVE
Comment: NEGATIVE
Comment: NEGATIVE
Comment: NORMAL
Neisseria Gonorrhea: NEGATIVE
Trichomonas: POSITIVE — AB

## 2023-01-30 MED ORDER — METRONIDAZOLE 500 MG PO TABS
500.0000 mg | ORAL_TABLET | Freq: Two times a day (BID) | ORAL | 1 refills | Status: AC
Start: 2023-01-30 — End: 2023-02-06

## 2023-02-02 ENCOUNTER — Telehealth: Payer: Self-pay

## 2023-02-02 NOTE — Telephone Encounter (Addendum)
-----   Message from Warden Fillers, MD sent at 01/30/2023  3:13 PM EDT ----- Bhcg negative.  STD panel normal other than trichomonas noted on swab, will treat, TOC in 6-8 weeks   Pt notified of results and treatment.  Pt stated that she picked up the medication yesterday.  Pt advised to make sure that her partner(s) get treated as well.  To abstain from intercourse until both have been treated for two weeks.  Pt verbalized understanding.   Kelly Crosby  02/02/23

## 2023-02-04 LAB — CYTOLOGY - PAP
Chlamydia: NEGATIVE
Comment: NEGATIVE
Comment: NEGATIVE
Comment: NORMAL
Diagnosis: NEGATIVE
Diagnosis: REACTIVE
High risk HPV: NEGATIVE
Neisseria Gonorrhea: NEGATIVE

## 2023-02-09 ENCOUNTER — Ambulatory Visit (INDEPENDENT_AMBULATORY_CARE_PROVIDER_SITE_OTHER): Payer: Medicaid Other | Admitting: Mental Health

## 2023-02-09 DIAGNOSIS — F4310 Post-traumatic stress disorder, unspecified: Secondary | ICD-10-CM

## 2023-02-09 DIAGNOSIS — F250 Schizoaffective disorder, bipolar type: Secondary | ICD-10-CM

## 2023-02-09 DIAGNOSIS — F419 Anxiety disorder, unspecified: Secondary | ICD-10-CM

## 2023-02-09 NOTE — Progress Notes (Signed)
THERAPIST PROGRESS NOTE Virtual Visit via Video Note  I connected with Kelly Crosby on 02/09/23 at  4:00 PM EDT by a video enabled telemedicine application and verified that I am speaking with the correct person using two identifiers.  Location: Patient: home address on file Provider: home address   I discussed the limitations of evaluation and management by telemedicine and the availability of in person appointments. The patient expressed understanding and agreed to proceed.  I discussed the assessment and treatment plan with the patient. The patient was provided an opportunity to ask questions and all were answered. The patient agreed with the plan and demonstrated an understanding of the instructions.   The patient was advised to call back or seek an in-person evaluation if the symptoms worsen or if the condition fails to improve as anticipated.  I provided 25 minutes of non-face-to-face time during this encounter.   Dorris Singh, [facility]   Session Time: 4:04 PM   Participation Level: Active  Behavioral Response: CasualAlertWNL  Type of Therapy: Individual Therapy  Treatment Goals addressed: STG: "Verbally express myself. More productive. Routine."Kelly Crosby will maintain stability with mental health AEB engagement in healthy habits daily with ability to communicate thoughts and feelings effectively within the next 90 days.    STG: Kelly Crosby will reduce feelings of stress/anxiety AEB development of effective problem solving skills with engagement daily in relaxation/grounding skills within the next 6 months   ProgressTowards Goals: Progressing  Interventions: Supportive  Summary: Kelly Crosby is a 32 y.o. female who presents for tele-therapy session with dx of schizoaffective disorder bipolar type, PTSD and cannabis use.  Chief complaint of low energy and at times feeling as if she lacks adequate engagement during the day. Reports for sxs to be stable at this time and  denies extreme highs or lows. Sleep and appetite adequate. Reports to be managing feelings of stress as needed an denies concerns for anxiety at this time. Shares has been spending time with her daughter and continues to attend college courses with taking now 7th course and good GPA. Shares working to get out of the house more frequently due to feelings of sluggishness at times with lacking much activity during the day. Explores option of working out of the home vs wanting to work from home and having online courses. Explores with therapist and identifies factors that are contributing to wellness as well as signs to look for for decompensation. Denies concerns. Denies SI/HI. Progress with goals with sxs stable at this time.   Suicidal/Homicidal: Nowithout intent/plan  Therapist Response: Therapist engaged Pt in therapy session. Completed check and and reviewed current level of functioning sxs management and level of stressors. Provided safe space for Kelly to share thoughts and feelings regarding current level of functioning and factors that have supported in ongoing wellness. Explored ways to increase engagement in community and engaging in balanced life. Explored desire to work outside of the home and educated on supported employment services. Explored signs and sxs of when things are changing. Reviewed session and provided follow up.   Plan: Return again in  x 6 weeks.  Diagnosis: Schizoaffective disorder, bipolar type (HCC)  PTSD (post-traumatic stress disorder)  Anxiety  Collaboration of Care: Other Referral to supported employment services   Patient/Guardian was advised Release of Information must be obtained prior to any record release in order to collaborate their care with an outside provider. Patient/Guardian was advised if they have not already done so to contact the registration department  to sign all necessary forms in order for [place] to release information regarding their care.    Consent: Patient/Guardian gives verbal consent for treatment and assignment of benefits for services provided during this visit. Patient/Guardian expressed understanding and agreed to proceed.   Stephan Minister [place], [facility] 02/09/2023

## 2023-03-18 ENCOUNTER — Other Ambulatory Visit: Payer: Self-pay

## 2023-03-18 ENCOUNTER — Ambulatory Visit: Payer: MEDICAID

## 2023-03-18 NOTE — Progress Notes (Signed)
Pt here today for TOC.  Pt advised that she should wait three months until she is tested again.  Pt rescheduled for 04/16/23.  Pt requests UPT.  Pt reports that her LMP 02/23/23.  Pt advised to wait until she misses her period before testing.  Pt verbalized understanding with no further questions.   Addison Naegeli, RN  03/18/23

## 2023-03-26 ENCOUNTER — Encounter (HOSPITAL_COMMUNITY): Payer: Self-pay

## 2023-03-26 ENCOUNTER — Ambulatory Visit (HOSPITAL_COMMUNITY): Payer: MEDICAID | Admitting: Mental Health

## 2023-03-27 ENCOUNTER — Telehealth (HOSPITAL_COMMUNITY): Payer: Self-pay | Admitting: Mental Health

## 2023-03-27 NOTE — Telephone Encounter (Signed)
Therapist sent link to connect to tele-therapy appointment x 2; no response. Contacted pt via telephone who reported to currently be sleep and requested to reschedule. Rescheduled for 10/2 @ 4pm virtual. Front desk alerted of rescheduled date.

## 2023-04-15 NOTE — Progress Notes (Deleted)
Kelly Crosby is here with concern of ***. These symptoms have been present for ***. Patient reports she has {trialed:28378} Toc for trich from 2 months ago   Pertinent history:  Plan of care:   Self swab instructions given and specimen obtained. Explained patient will be contacted with any abnormal results. Patient is {annual EGB:15176}  Meryl Crutch, RN 04/16/23 at

## 2023-04-16 ENCOUNTER — Ambulatory Visit: Payer: MEDICAID

## 2023-04-23 ENCOUNTER — Ambulatory Visit: Payer: MEDICAID

## 2023-04-23 ENCOUNTER — Encounter: Payer: Self-pay | Admitting: *Deleted

## 2023-05-13 ENCOUNTER — Encounter (HOSPITAL_COMMUNITY): Payer: Self-pay

## 2023-05-13 ENCOUNTER — Ambulatory Visit (HOSPITAL_COMMUNITY): Payer: MEDICAID | Admitting: Mental Health

## 2023-05-13 DIAGNOSIS — F250 Schizoaffective disorder, bipolar type: Secondary | ICD-10-CM

## 2023-05-13 NOTE — Progress Notes (Signed)
Therapist presented to tele-therapy session however was not able to see or hear pt after x 5 minutes. Contacted pt via telephone, no answer. Left voicemail. Therapist continued to wait in video session till pt disconnected at 4:10pm. Therapist attempted to contact pt again with call going straight to VM. Left without being seen.

## 2023-05-21 ENCOUNTER — Ambulatory Visit (INDEPENDENT_AMBULATORY_CARE_PROVIDER_SITE_OTHER): Payer: MEDICAID

## 2023-05-21 DIAGNOSIS — Z3201 Encounter for pregnancy test, result positive: Secondary | ICD-10-CM

## 2023-05-21 DIAGNOSIS — Z3200 Encounter for pregnancy test, result unknown: Secondary | ICD-10-CM

## 2023-05-21 DIAGNOSIS — Z3202 Encounter for pregnancy test, result negative: Secondary | ICD-10-CM | POA: Diagnosis not present

## 2023-05-21 LAB — POCT PREGNANCY, URINE: Preg Test, Ur: NEGATIVE

## 2023-05-21 NOTE — Progress Notes (Signed)
Possible Pregnancy  Here today to leave urine specimen for pregnancy confirmation. UPT in office today is negative. Called pt. Denies any positive test at home. Reports taking OCPs regularly. Not trying for pregnancy but noticed nausea, weight gain, and increased appetite which made her think she may be pregnant. Explained it is unlikely she is pregnant with regular OCPs and negative UPT. Encouraged pt to take home UPT in 1 week if she is still concerned.  Marjo Bicker, RN 05/21/2023  1:21 PM

## 2023-05-22 ENCOUNTER — Encounter (HOSPITAL_BASED_OUTPATIENT_CLINIC_OR_DEPARTMENT_OTHER): Payer: Self-pay | Admitting: Emergency Medicine

## 2023-05-22 ENCOUNTER — Other Ambulatory Visit: Payer: Self-pay

## 2023-05-22 DIAGNOSIS — Z91040 Latex allergy status: Secondary | ICD-10-CM | POA: Diagnosis not present

## 2023-05-22 DIAGNOSIS — R059 Cough, unspecified: Secondary | ICD-10-CM | POA: Diagnosis present

## 2023-05-22 DIAGNOSIS — R112 Nausea with vomiting, unspecified: Secondary | ICD-10-CM | POA: Diagnosis not present

## 2023-05-22 DIAGNOSIS — J0100 Acute maxillary sinusitis, unspecified: Secondary | ICD-10-CM | POA: Diagnosis not present

## 2023-05-22 DIAGNOSIS — Z1152 Encounter for screening for COVID-19: Secondary | ICD-10-CM | POA: Insufficient documentation

## 2023-05-22 LAB — RESP PANEL BY RT-PCR (RSV, FLU A&B, COVID)  RVPGX2
Influenza A by PCR: NEGATIVE
Influenza B by PCR: NEGATIVE
Resp Syncytial Virus by PCR: NEGATIVE
SARS Coronavirus 2 by RT PCR: NEGATIVE

## 2023-05-22 NOTE — ED Notes (Signed)
RT assessed pt for SOB. Pt BLBS clear throughout all lung fields. Respiratory status stable w/no distress noted at this time.    05/22/23 2101  Therapy Vitals  Temp 98.3 F (36.8 C)  Pulse Rate 93  Resp 19  BP (!) 142/90  Patient Position (if appropriate) Sitting  MEWS Score/Color  MEWS Score 0  MEWS Score Color Green  Respiratory Assessment  Assessment Type Assess only  Respiratory Pattern Regular;Unlabored;Symmetrical  Chest Assessment Chest expansion symmetrical  Cough Strong;Congested  Bilateral Breath Sounds Clear  R Upper  Breath Sounds Clear  L Upper Breath Sounds Clear  R Lower Breath Sounds Clear  L Lower Breath Sounds Clear  Oxygen Therapy/Pulse Ox  O2 Device Room Air  O2 Therapy Room air  SpO2 99 %

## 2023-05-22 NOTE — ED Triage Notes (Signed)
Cough and uri symptoms  "for a while" "I feel like crap"

## 2023-05-23 ENCOUNTER — Emergency Department (HOSPITAL_BASED_OUTPATIENT_CLINIC_OR_DEPARTMENT_OTHER)
Admission: EM | Admit: 2023-05-23 | Discharge: 2023-05-23 | Disposition: A | Discharge status: Home / Self Care | Payer: MEDICAID | Attending: Emergency Medicine | Admitting: Emergency Medicine

## 2023-05-23 DIAGNOSIS — J0100 Acute maxillary sinusitis, unspecified: Secondary | ICD-10-CM

## 2023-05-23 HISTORY — DX: Accidental discharge from unspecified firearms or gun, initial encounter: W34.00XA

## 2023-05-23 MED ORDER — ONDANSETRON 4 MG PO TBDP: ORAL_TABLET | ORAL | 0 refills | Status: DC

## 2023-05-23 MED ORDER — ONDANSETRON HCL 4 MG PO TABS
4.0000 mg | ORAL_TABLET | Freq: Once | ORAL | Status: AC
  Administered 2023-05-23: 4 mg via ORAL
  Filled 2023-05-23: qty 1

## 2023-05-23 MED ORDER — DOXYCYCLINE HYCLATE 100 MG PO CAPS: 100.0000 mg | ORAL_CAPSULE | Freq: Two times a day (BID) | ORAL | 0 refills | Status: DC

## 2023-05-23 MED ORDER — DOXYCYCLINE HYCLATE 100 MG PO TABS
100.0000 mg | ORAL_TABLET | Freq: Once | ORAL | Status: AC
  Administered 2023-05-23: 100 mg via ORAL
  Filled 2023-05-23: qty 1

## 2023-05-23 MED ORDER — BENZONATATE 100 MG PO CAPS: 100.0000 mg | ORAL_CAPSULE | Freq: Three times a day (TID) | ORAL | 0 refills | Status: DC

## 2023-05-23 NOTE — ED Provider Notes (Signed)
[place] EMERGENCY DEPARTMENT AT [facility] Provider Note   CSN: [id number] Arrival date & time: 05/22/23  2035     History  Chief Complaint  Patient presents with   Cough    Kelly Crosby is a 32 y.o. female.  32 yo F with a chief complaints of cough congestion fevers and chills going on for couple weeks now.  No known sick contacts.  Had started having nausea and vomiting over the past couple days.  No abdominal pain.  No diarrhea.   Cough      Home Medications Prior to Admission medications   Medication Sig Start Date End Date Taking? Authorizing Provider  benzonatate (TESSALON) 100 MG capsule Take 1 capsule (100 mg total) by mouth every 8 (eight) hours. 05/23/23  Yes Melene Plan, DO  doxycycline (VIBRAMYCIN) 100 MG capsule Take 1 capsule (100 mg total) by mouth 2 (two) times daily. One po bid x 7 days 05/23/23  Yes Melene Plan, DO  ondansetron (ZOFRAN-ODT) 4 MG disintegrating tablet 4mg  ODT q4 hours prn nausea/vomit 05/23/23  Yes Melene Plan, DO  Norethindrone Acetate-Ethinyl Estrad-FE (LOESTRIN 24 FE) 1-20 MG-MCG(24) tablet Take 1 tablet by mouth daily. 01/28/23   Warden Fillers, MD      Allergies    Latex, Oat, Amoxicillin, Mustard, and Other    Review of Systems   Review of Systems  Respiratory:  Positive for cough.     Physical Exam Updated Vital Signs BP (!) 142/90 (BP Location: Right Arm)   Pulse 93   Temp 98.3 F (36.8 C)   Resp 19   LMP 05/22/2023   SpO2 99%  Physical Exam Vitals and nursing note reviewed.  Constitutional:      General: She is not in acute distress.    Appearance: She is well-developed. She is not diaphoretic.  HENT:     Head: Normocephalic and atraumatic.     Comments: Swollen turbinates, posterior nasal drip, left maxillary sinuses exquisitely tender to percussion, tm normal bilaterally.   Eyes:     Pupils: Pupils are equal, round, and reactive to light.  Cardiovascular:     Rate and Rhythm: Normal rate and  regular rhythm.     Heart sounds: No murmur heard.    No friction rub. No gallop.  Pulmonary:     Effort: Pulmonary effort is normal.     Breath sounds: No wheezing or rales.  Abdominal:     General: There is no distension.     Palpations: Abdomen is soft.     Tenderness: There is no abdominal tenderness.  Musculoskeletal:        General: No tenderness.     Cervical back: Normal range of motion and neck supple.  Skin:    General: Skin is warm and dry.  Neurological:     Mental Status: She is alert and oriented to person, place, and time.  Psychiatric:        Behavior: Behavior normal.     ED Results / Procedures / Treatments   Labs (all labs ordered are listed, but only abnormal results are displayed) Labs Reviewed  RESP PANEL BY RT-PCR (RSV, FLU A&B, COVID)  [id number]    EKG EKG Interpretation Date/Time:  Friday May 22 2023 21:05:12 EDT Ventricular Rate:  92 PR Interval:  128 QRS Duration:  90 QT Interval:  326 QTC Calculation: 403 R Axis:   57  Text Interpretation: Normal sinus rhythm Normal ECG When compared with ECG of 22-Jan-2022 05:25, No  significant change was found Confirmed by Meridee Score [phone]) on 05/22/2023 9:08:16 PM  Radiology No results found.  Procedures Procedures    Medications Ordered in ED Medications  ondansetron (ZOFRAN) tablet 4 mg (4 mg Oral Given 05/23/23 0052)  doxycycline (VIBRA-TABS) tablet 100 mg (100 mg Oral Given 05/23/23 0051)    ED Course/ Medical Decision Making/ A&P                                 Medical Decision Making Risk Prescription drug management.   32 yo F with a chief complaints of what sounds like a viral upper respiratory syndrome cough congestion fever chills myalgias going on for weeks now.  She is well-appearing nontoxic.  Is clear lung sounds no tachypnea no hypoxia do not feel she benefit from chest imaging.  She had COVID flu and RSV testing done through the triage process that was negative.  She  does have some signs and symptoms consistent with sinusitis.  Will start on antibiotics.  Follow-up.  3:11 AM:  I have discussed the diagnosis/risks/treatment options with the patient.  Evaluation and diagnostic testing in the emergency department does not suggest an emergent condition requiring admission or immediate intervention beyond what has been performed at this time.  They will follow up with PCP. We also discussed returning to the ED immediately if new or worsening sx occur. We discussed the sx which are most concerning (e.g., sudden worsening pain, fever, inability to tolerate by mouth) that necessitate immediate return. Medications administered to the patient during their visit and any new prescriptions provided to the patient are listed below.  Medications given during this visit Medications  ondansetron (ZOFRAN) tablet 4 mg (4 mg Oral Given 05/23/23 0052)  doxycycline (VIBRA-TABS) tablet 100 mg (100 mg Oral Given 05/23/23 0051)     The patient appears reasonably screen and/or stabilized for discharge and I doubt any other medical condition or other [facility] requiring further screening, evaluation, or treatment in the ED at this time prior to discharge.          Final Clinical Impression(s) / ED Diagnoses Final diagnoses:  Acute maxillary sinusitis, recurrence not specified    Rx / DC Orders ED Discharge Orders          Ordered    benzonatate (TESSALON) 100 MG capsule  Every 8 hours        05/23/23 0045    ondansetron (ZOFRAN-ODT) 4 MG disintegrating tablet        05/23/23 0045    doxycycline (VIBRAMYCIN) 100 MG capsule  2 times daily        05/23/23 0045              Melene Plan, DO 05/23/23 [phone]

## 2023-05-23 NOTE — Discharge Instructions (Signed)
Take tylenol 2 pills 4 times a day and motrin 4 pills 3 times a day.  Drink plenty of fluids.  Return for worsening shortness of breath, headache, confusion. Follow up with your family doctor.

## 2023-06-17 ENCOUNTER — Ambulatory Visit (INDEPENDENT_AMBULATORY_CARE_PROVIDER_SITE_OTHER): Payer: MEDICAID | Admitting: Family

## 2023-06-17 ENCOUNTER — Ambulatory Visit: Payer: MEDICAID

## 2023-06-17 VITALS — BP 103/77 | HR 119 | Temp 99.0°F | Ht 66.5 in | Wt 271.4 lb

## 2023-06-17 DIAGNOSIS — R0989 Other specified symptoms and signs involving the circulatory and respiratory systems: Secondary | ICD-10-CM

## 2023-06-17 DIAGNOSIS — Z7689 Persons encountering health services in other specified circumstances: Secondary | ICD-10-CM

## 2023-06-17 DIAGNOSIS — Z1231 Encounter for screening mammogram for malignant neoplasm of breast: Secondary | ICD-10-CM

## 2023-06-17 DIAGNOSIS — N926 Irregular menstruation, unspecified: Secondary | ICD-10-CM | POA: Diagnosis not present

## 2023-06-17 DIAGNOSIS — Z113 Encounter for screening for infections with a predominantly sexual mode of transmission: Secondary | ICD-10-CM

## 2023-06-17 DIAGNOSIS — Z3200 Encounter for pregnancy test, result unknown: Secondary | ICD-10-CM | POA: Diagnosis not present

## 2023-06-17 DIAGNOSIS — Z803 Family history of malignant neoplasm of breast: Secondary | ICD-10-CM

## 2023-06-17 LAB — POCT URINE PREGNANCY: Preg Test, Ur: NEGATIVE

## 2023-06-17 LAB — POCT URINALYSIS DIP (CLINITEK)
Bilirubin, UA: NEGATIVE
Blood, UA: NEGATIVE
Glucose, UA: NEGATIVE mg/dL
Ketones, POC UA: NEGATIVE mg/dL
Leukocytes, UA: NEGATIVE
Nitrite, UA: NEGATIVE
POC PROTEIN,UA: NEGATIVE
Spec Grav, UA: 1.02 (ref 1.010–1.025)
Urobilinogen, UA: 0.2 U/dL
pH, UA: 6.5 (ref 5.0–8.0)

## 2023-06-17 NOTE — Progress Notes (Signed)
Subjective:    Kelly Crosby - 32 y.o. female MRN [id number]  Date of birth: Feb 09, 1991  HPI  Ravina Milner Buehler is to establish care.   Current issues and/or concerns: - Irregular periods. Reports spotting, nausea, increased urination, increased weight and states craving pizza. She has not taken any home pregnancy tests. She is established with Gynecology for routine women's health maintenance/screenings.  - Requests STD screening. She denies symptoms/recent exposure. - Reports family history of breast cancer. Requests mammogram. - Reports was seen last month at Urgent Care for sinus infection. States she was prescribed an antibiotic. Reports she has tried several over-the-counter medications and natural remedies with minimal relief. Reports currently feels chest congestion and has intermittent productive cough. She denies red flag symptoms. She is a smoker. - No further issues/concerns for discussion today.     ROS per HPI    Health Maintenance:  Health Maintenance Due  Topic Date Due   INFLUENZA VACCINE  03/12/2023   COVID-19 Vaccine (2 - 2023-24 season) 04/12/2023     Past Medical History: Patient Active Problem List   Diagnosis Date Noted   Suicidal ideations 01/21/2022   Anxiety 11/09/2021   Obesity 11/09/2021   PTSD (post-traumatic stress disorder) 11/09/2021   Personality disorder (HCC) 07/14/2018   Schizoaffective disorder (HCC) 09/26/2016   Cannabis use disorder, severe, dependence (HCC) 09/26/2016      Social History   reports that she has been smoking cigarettes and e-cigarettes. She has never used smokeless tobacco. She reports current alcohol use. She reports current drug use. Drug: Marijuana.   Family History  family history includes Hypertension in her mother and another family member; Post-traumatic stress disorder in her father.   Medications: reviewed and updated   Objective:   Physical Exam BP 103/77   Pulse (!) 119   Temp 99 F (37.2 C)  (Oral)   Ht 5' 6.5" (1.689 m)   Wt 271 lb 6.4 oz (123.1 kg)   LMP 05/22/2023   SpO2 92%   BMI 43.15 kg/m   Physical Exam HENT:     Head: Normocephalic and atraumatic.     Nose: Nose normal.     Mouth/Throat:     Mouth: Mucous membranes are moist.     Pharynx: Oropharynx is clear.  Eyes:     Extraocular Movements: Extraocular movements intact.     Conjunctiva/sclera: Conjunctivae normal.     Pupils: Pupils are equal, round, and reactive to light.  Cardiovascular:     Rate and Rhythm: Tachycardia present.     Pulses: Normal pulses.     Heart sounds: Normal heart sounds.  Pulmonary:     Effort: Pulmonary effort is normal.     Breath sounds: Normal breath sounds.  Musculoskeletal:        General: Normal range of motion.     Cervical back: Normal range of motion and neck supple.  Neurological:     General: No focal deficit present.     Mental Status: She is alert and oriented to person, place, and time.  Psychiatric:        Mood and Affect: Mood normal.        Behavior: Behavior normal.        Assessment & Plan:  1. Encounter to establish care - Patient presents today to establish care. During the interim follow-up with primary provider as scheduled.  - Return for annual physical examination, labs, and health maintenance. Arrive fasting meaning having no food for at least 8  hours prior to appointment. You may have only water or black coffee. Please take scheduled medications as normal.  2. Irregular menses 3. Encounter for pregnancy test, result unknown - Routine screening.  - hCG, serum, qualitative - POCT urine pregnancy; Future  4. Routine screening for STI (sexually transmitted infection) - Routine screening.  - Cervicovaginal ancillary only - POCT URINALYSIS DIP (CLINITEK); Future  5. Encounter for screening mammogram for malignant neoplasm of breast 6. Family history of breast cancer - Referral for breast cancer screening by mammogram.  - MM Digital  Screening; Future  7. Chest congestion - Patient today in office with no cardiopulmonary/acute distress.  - Routine screening.  - Chest xray for further evaluation.  - DG Chest 2 View; Future - COVID-19, Flu A+B and RSV   Patient was given clear instructions to go to Emergency Department or return to medical center if symptoms don't improve, worsen, or new problems develop.The patient verbalized understanding.  I discussed the assessment and treatment plan with the patient. The patient was provided an opportunity to ask questions and all were answered. The patient agreed with the plan and demonstrated an understanding of the instructions.   The patient was advised to call back or seek an in-person evaluation if the symptoms worsen or if the condition fails to improve as anticipated.    Ricky Stabs, NP 06/17/2023, 3:37 PM Primary Care at [facility]

## 2023-06-17 NOTE — Progress Notes (Signed)
Patient states possible pregnancy.  States chest congestion. Asking for STD, and STI test. 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   Declined Flu shot.

## 2023-06-18 LAB — HCG, SERUM, QUALITATIVE: hCG,Beta Subunit,Qual,Serum: NEGATIVE m[IU]/mL (ref <6)

## 2023-06-19 LAB — COVID-19, FLU A+B AND RSV
Influenza A, NAA: NOT DETECTED
Influenza B, NAA: NOT DETECTED
RSV, NAA: NOT DETECTED
SARS-CoV-2, NAA: NOT DETECTED

## 2023-07-27 ENCOUNTER — Ambulatory Visit (INDEPENDENT_AMBULATORY_CARE_PROVIDER_SITE_OTHER): Payer: MEDICAID | Admitting: Family

## 2023-07-27 VITALS — BP 104/71 | HR 74 | Temp 98.8°F | Ht 66.0 in | Wt 276.8 lb

## 2023-07-27 DIAGNOSIS — R110 Nausea: Secondary | ICD-10-CM

## 2023-07-27 DIAGNOSIS — Z13220 Encounter for screening for lipoid disorders: Secondary | ICD-10-CM

## 2023-07-27 DIAGNOSIS — R109 Unspecified abdominal pain: Secondary | ICD-10-CM

## 2023-07-27 DIAGNOSIS — Z0000 Encounter for general adult medical examination without abnormal findings: Secondary | ICD-10-CM

## 2023-07-27 DIAGNOSIS — Z13228 Encounter for screening for other metabolic disorders: Secondary | ICD-10-CM

## 2023-07-27 DIAGNOSIS — Z3200 Encounter for pregnancy test, result unknown: Secondary | ICD-10-CM

## 2023-07-27 DIAGNOSIS — R053 Chronic cough: Secondary | ICD-10-CM

## 2023-07-27 DIAGNOSIS — Z131 Encounter for screening for diabetes mellitus: Secondary | ICD-10-CM | POA: Diagnosis not present

## 2023-07-27 DIAGNOSIS — R143 Flatulence: Secondary | ICD-10-CM

## 2023-07-27 DIAGNOSIS — Z130 Encounter for screening for diseases of the blood and blood-forming organs and certain disorders involving the immune mechanism: Secondary | ICD-10-CM | POA: Diagnosis not present

## 2023-07-27 DIAGNOSIS — F17200 Nicotine dependence, unspecified, uncomplicated: Secondary | ICD-10-CM

## 2023-07-27 DIAGNOSIS — Z1329 Encounter for screening for other suspected endocrine disorder: Secondary | ICD-10-CM

## 2023-07-27 LAB — POCT URINE PREGNANCY: Preg Test, Ur: NEGATIVE

## 2023-07-27 MED ORDER — ONDANSETRON 4 MG PO TBDP
4.0000 mg | ORAL_TABLET | Freq: Three times a day (TID) | ORAL | 1 refills | Status: DC | PRN
Start: 2023-07-27 — End: 2023-10-11

## 2023-07-27 NOTE — Progress Notes (Signed)
Patient states she has been passing more gas in her sleep.  Patient wants pregnancy test done.   Patient asking to take over zofran.
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
## 2023-07-28 ENCOUNTER — Other Ambulatory Visit: Payer: Self-pay | Admitting: Family

## 2023-07-28 DIAGNOSIS — Z13220 Encounter for screening for lipoid disorders: Secondary | ICD-10-CM

## 2023-07-28 LAB — CBC
Hematocrit: 42.2 % (ref 34.0–46.6)
Hemoglobin: 13.9 g/dL (ref 11.1–15.9)
MCH: 28.5 pg (ref 26.6–33.0)
MCHC: 32.9 g/dL (ref 31.5–35.7)
MCV: 87 fL (ref 79–97)
Platelets: 366 10*3/uL (ref 150–450)
RBC: 4.87 x10E6/uL (ref 3.77–5.28)
RDW: 13.2 % (ref 11.7–15.4)
WBC: 10.6 10*3/uL (ref 3.4–10.8)

## 2023-07-28 LAB — LIPID PANEL
Chol/HDL Ratio: 3.9 {ratio} (ref 0.0–4.4)
Cholesterol, Total: 169 mg/dL (ref 100–199)
HDL: 43 mg/dL (ref >39)
LDL Chol Calc (NIH): 93 mg/dL (ref 0–99)
Triglycerides: 195 mg/dL — ABNORMAL HIGH (ref 0–149)
VLDL Cholesterol Cal: 33 mg/dL (ref 5–40)

## 2023-07-28 LAB — HEMOGLOBIN A1C
Est. average glucose Bld gHb Est-mCnc: 105 mg/dL
Hgb A1c MFr Bld: 5.3 % (ref 4.8–5.6)

## 2023-07-28 LAB — CMP14+EGFR
ALT: 15 [IU]/L (ref 0–32)
AST: 16 [IU]/L (ref 0–40)
Albumin: 3.9 g/dL (ref 3.9–4.9)
Alkaline Phosphatase: 63 [IU]/L (ref 44–121)
BUN/Creatinine Ratio: 7 — ABNORMAL LOW (ref 9–23)
BUN: 9 mg/dL (ref 6–20)
Bilirubin Total: <0.2 mg/dL (ref 0.0–1.2)
CO2: 20 mmol/L (ref 20–29)
Calcium: 9.1 mg/dL (ref 8.7–10.2)
Chloride: 106 mmol/L (ref 96–106)
Creatinine, Ser: 1.23 mg/dL — ABNORMAL HIGH (ref 0.57–1.00)
Globulin, Total: 2.3 g/dL (ref 1.5–4.5)
Glucose: 85 mg/dL (ref 70–99)
Potassium: 4.8 mmol/L (ref 3.5–5.2)
Sodium: 141 mmol/L (ref 134–144)
Total Protein: 6.2 g/dL (ref 6.0–8.5)
eGFR: 60 mL/min/{1.73_m2} (ref >59)

## 2023-07-28 LAB — TSH: TSH: 0.927 u[IU]/mL (ref 0.450–4.500)

## 2023-07-28 LAB — HCG, SERUM, QUALITATIVE: hCG,Beta Subunit,Qual,Serum: NEGATIVE m[IU]/mL (ref <6)

## 2023-07-30 ENCOUNTER — Other Ambulatory Visit: Payer: MEDICAID

## 2023-07-31 ENCOUNTER — Ambulatory Visit
Admission: RE | Admit: 2023-07-31 | Discharge: 2023-07-31 | Disposition: A | Discharge status: Home / Self Care | Payer: MEDICAID | Source: Ambulatory Visit | Attending: Family | Admitting: Family

## 2023-07-31 DIAGNOSIS — R109 Unspecified abdominal pain: Secondary | ICD-10-CM

## 2023-07-31 DIAGNOSIS — R143 Flatulence: Secondary | ICD-10-CM

## 2023-10-03 ENCOUNTER — Emergency Department (HOSPITAL_COMMUNITY)
Admission: EM | Admit: 2023-10-03 | Discharge: 2023-10-04 | Disposition: A | Discharge status: Home / Self Care | Payer: MEDICAID | Attending: Emergency Medicine | Admitting: Emergency Medicine

## 2023-10-03 DIAGNOSIS — F3010 Manic episode without psychotic symptoms, unspecified: Secondary | ICD-10-CM

## 2023-10-03 DIAGNOSIS — F309 Manic episode, unspecified: Secondary | ICD-10-CM | POA: Diagnosis present

## 2023-10-03 DIAGNOSIS — F250 Schizoaffective disorder, bipolar type: Secondary | ICD-10-CM | POA: Insufficient documentation

## 2023-10-03 DIAGNOSIS — F1220 Cannabis dependence, uncomplicated: Secondary | ICD-10-CM | POA: Diagnosis not present

## 2023-10-03 DIAGNOSIS — F17210 Nicotine dependence, cigarettes, uncomplicated: Secondary | ICD-10-CM | POA: Insufficient documentation

## 2023-10-03 DIAGNOSIS — F1290 Cannabis use, unspecified, uncomplicated: Secondary | ICD-10-CM | POA: Insufficient documentation

## 2023-10-03 LAB — COMPREHENSIVE METABOLIC PANEL
ALT: 54 U/L — ABNORMAL HIGH (ref 0–44)
AST: 36 U/L (ref 15–41)
Albumin: 4.1 g/dL (ref 3.5–5.0)
Alkaline Phosphatase: 48 U/L (ref 38–126)
Anion gap: 14 (ref 5–15)
BUN: 11 mg/dL (ref 6–20)
CO2: 18 mmol/L — ABNORMAL LOW (ref 22–32)
Calcium: 9.1 mg/dL (ref 8.9–10.3)
Chloride: 109 mmol/L (ref 98–111)
Creatinine, Ser: 0.84 mg/dL (ref 0.44–1.00)
GFR, Estimated: >60 mL/min (ref >60)
Glucose, Bld: 89 mg/dL (ref 70–99)
Potassium: 3.5 mmol/L (ref 3.5–5.1)
Sodium: 141 mmol/L (ref 135–145)
Total Bilirubin: 2 mg/dL — ABNORMAL HIGH (ref 0.0–1.2)
Total Protein: 7.7 g/dL (ref 6.5–8.1)

## 2023-10-03 LAB — URINALYSIS, W/ REFLEX TO CULTURE (INFECTION SUSPECTED)
Glucose, UA: NEGATIVE mg/dL
Ketones, ur: 80 mg/dL — AB
Leukocytes,Ua: NEGATIVE
Nitrite: NEGATIVE
Protein, ur: 100 mg/dL — AB
Specific Gravity, Urine: 1.034 — ABNORMAL HIGH (ref 1.005–1.030)
pH: 5 (ref 5.0–8.0)

## 2023-10-03 LAB — CBC WITH DIFFERENTIAL/PLATELET
Abs Immature Granulocytes: 0.07 10*3/uL (ref 0.00–0.07)
Basophils Absolute: 0.1 10*3/uL (ref 0.0–0.1)
Basophils Relative: 0 %
Eosinophils Absolute: 0 10*3/uL (ref 0.0–0.5)
Eosinophils Relative: 0 %
HCT: 36.8 % (ref 36.0–46.0)
Hemoglobin: 13.1 g/dL (ref 12.0–15.0)
Immature Granulocytes: 0 %
Lymphocytes Relative: 14 %
Lymphs Abs: 2.7 10*3/uL (ref 0.7–4.0)
MCH: 29.2 pg (ref 26.0–34.0)
MCHC: 35.6 g/dL (ref 30.0–36.0)
MCV: 82.1 fL (ref 80.0–100.0)
Monocytes Absolute: 1.2 10*3/uL — ABNORMAL HIGH (ref 0.1–1.0)
Monocytes Relative: 6 %
Neutro Abs: 14.9 10*3/uL — ABNORMAL HIGH (ref 1.7–7.7)
Neutrophils Relative %: 80 %
Platelets: 327 10*3/uL (ref 150–400)
RBC: 4.48 MIL/uL (ref 3.87–5.11)
RDW: 12.4 % (ref 11.5–15.5)
WBC: 19 10*3/uL — ABNORMAL HIGH (ref 4.0–10.5)
nRBC: 0 % (ref 0.0–0.2)

## 2023-10-03 LAB — RAPID URINE DRUG SCREEN, HOSP PERFORMED
Amphetamines: NOT DETECTED
Barbiturates: NOT DETECTED
Benzodiazepines: POSITIVE — AB
Cocaine: NOT DETECTED
Opiates: NOT DETECTED
Tetrahydrocannabinol: POSITIVE — AB

## 2023-10-03 LAB — ETHANOL: Alcohol, Ethyl (B): <10 mg/dL (ref <10)

## 2023-10-03 LAB — HCG, SERUM, QUALITATIVE: Preg, Serum: NEGATIVE

## 2023-10-03 MED ORDER — NORETHIN ACE-ETH ESTRAD-FE 1-20 MG-MCG(24) PO TABS
1.0000 | ORAL_TABLET | Freq: Every day | ORAL | Status: DC
Start: 2023-10-04 — End: 2023-10-04

## 2023-10-03 NOTE — ED Notes (Signed)
 RN provided pt with 8oz gingerale

## 2023-10-03 NOTE — ED Triage Notes (Signed)
 Pt BIBA for manic behavior reported.  EMS states patient was found on [address] in various stages of undressing yelling and acting aggressively.  Hx of Bipolar

## 2023-10-03 NOTE — Consult Note (Signed)
 Attempted to see patient for psych assessment via tts cart.  Was informed by Suzanna Obey, paramedic caring for patient that she is asleep.  She will notify this Clinical research associate when she is awake.

## 2023-10-03 NOTE — ED Notes (Addendum)
 Pt refused vitals to be taken and stated she does not want to be touched at this time.

## 2023-10-03 NOTE — ED Provider Notes (Signed)
 [place] EM[facility]GENC[facility] DEPARTMENT AT [facility] Provider Note   CSN: [id number] Arrival date & time: 10/03/23  1011     History  Chief Complaint  Patient presents with   Manic Behavior    Kelly Crosby is a 33 y.o. female.  Patient is a 33 year old female with a history of schizoaffective disorder who presents with manic behavior.  She was brought in by EMS.  Much of history is obtained from EMS.  Reportedly she was found on the side of the road and dressing herself and yelling aggressively.  She required midazolam and Haldol to be able to get transported.  When I ask her why she comes over, she says "my manic is bad".  She denies any thoughts of suicide.  She denies any physical complaints.       Home Medications Prior to Admission medications   Medication Sig Start Date End Date Taking? Authorizing Provider  Norethindrone Acetate-Ethinyl Estrad-FE (LOESTRIN 24 FE) 1-20 MG-MCG(24) tablet Take 1 tablet by mouth daily. 01/28/23   Warden Fillers, MD  ondansetron (ZOFRAN-ODT) 4 MG disintegrating tablet Take 1 tablet (4 mg total) by mouth every 8 (eight) hours as needed for nausea or vomiting. 4mg  ODT q4 hours prn nausea/vomit 07/27/23   Rema Fendt, NP      Allergies    Oat, Amoxicillin, Mustard, and Other    Review of Systems   Review of Systems  Unable to perform ROS: Psychiatric disorder    Physical Exam Updated Vital Signs BP 106/62   Pulse 80   Temp 98.4 F (36.9 C)   Resp 18   SpO2 100%  Physical Exam Constitutional:      Appearance: She is well-developed.  HENT:     Head: Normocephalic and atraumatic.  Eyes:     Pupils: Pupils are equal, round, and reactive to light.  Cardiovascular:     Rate and Rhythm: Normal rate and regular rhythm.     Heart sounds: Normal heart sounds.  Pulmonary:     Effort: Pulmonary effort is normal. No respiratory distress.     Breath sounds: Normal breath sounds. No wheezing or rales.  Chest:     Chest wall: No  tenderness.  Abdominal:     General: Bowel sounds are normal.     Palpations: Abdomen is soft.     Tenderness: There is no abdominal tenderness. There is no guarding or rebound.  Musculoskeletal:        General: Normal range of motion.     Cervical back: Normal range of motion and neck supple.  Lymphadenopathy:     Cervical: No cervical adenopathy.  Skin:    General: Skin is warm and dry.     Findings: No rash.  Neurological:     General: No focal deficit present.     Mental Status: She is alert and oriented to person, place, and time.  Psychiatric:     Comments: Somewhat agitated but can currently be redirected with verbal de-escalation.     ED Results / Procedures / Treatments   Labs (all labs ordered are listed, but only abnormal results are displayed) Labs Reviewed  COMPREHENSIVE METABOLIC PANEL - Abnormal; Notable for the following components:      Result Value   CO2 18 (*)    ALT 54 (*)    Total Bilirubin 2.0 (*)    All other components within normal limits  RAPID URINE DRUG SCREEN, HOSP PERFORMED - Abnormal; Notable for the following components:  Benzodiazepines POSITIVE (*)    Tetrahydrocannabinol POSITIVE (*)    All other components within normal limits  CBC WITH DIFFERENTIAL/PLATELET - Abnormal; Notable for the following components:   WBC 19.0 (*)    Neutro Abs 14.9 (*)    Monocytes Absolute 1.2 (*)    All other components within normal limits  URINALYSIS, W/ REFLEX TO CULTURE (INFECTION SUSPECTED) - Abnormal; Notable for the following components:   Color, Urine AMBER (*)    APPearance HAZY (*)    Specific Gravity, Urine 1.034 (*)    Hgb urine dipstick MODERATE (*)    Bilirubin Urine SMALL (*)    Ketones, ur 80 (*)    Protein, ur 100 (*)    Bacteria, UA RARE (*)    All other components within normal limits  ETHANOL  HCG, SERUM, QUALITATIVE    EKG None  Radiology No results found.  Procedures Procedures    Medications Ordered in  ED Medications - No data to display  ED Course/ Medical Decision Making/ A&P                                 Medical Decision Making Amount and/or Complexity of Data Reviewed Labs: ordered.   Patient presents with manic behavior.  She was medicated by EMS.  Her behavior has improved but she still manic appearing.  Labs reviewed and are nonconcerning.  Her WBC count is elevated but she does not have fever or other suggestions of infection.  She is medically cleared and awaiting TTS evaluation.  IVC papers were initiated.  Final Clinical Impression(s) / ED Diagnoses Final diagnoses:  Manic behavior (HCC)    Rx / DC Orders ED Discharge Orders     None         Rolan Bucco, MD 10/03/23 1526

## 2023-10-03 NOTE — ED Notes (Signed)
 Pt refusing labs, doesn't want needle

## 2023-10-04 ENCOUNTER — Encounter (HOSPITAL_COMMUNITY): Payer: Self-pay | Admitting: Adult Health

## 2023-10-04 ENCOUNTER — Other Ambulatory Visit: Payer: Self-pay

## 2023-10-04 ENCOUNTER — Inpatient Hospital Stay (HOSPITAL_COMMUNITY)
Admission: AD | Admit: 2023-10-04 | Discharge: 2023-10-11 | DRG: 885 | Disposition: A | Discharge status: Home / Self Care | Payer: MEDICAID | Source: Intra-hospital | Attending: Psychiatry | Admitting: Psychiatry

## 2023-10-04 DIAGNOSIS — Z8249 Family history of ischemic heart disease and other diseases of the circulatory system: Secondary | ICD-10-CM

## 2023-10-04 DIAGNOSIS — F309 Manic episode, unspecified: Secondary | ICD-10-CM | POA: Diagnosis not present

## 2023-10-04 DIAGNOSIS — Z5948 Other specified lack of adequate food: Secondary | ICD-10-CM | POA: Diagnosis not present

## 2023-10-04 DIAGNOSIS — F609 Personality disorder, unspecified: Secondary | ICD-10-CM | POA: Diagnosis present

## 2023-10-04 DIAGNOSIS — Z5900 Homelessness unspecified: Secondary | ICD-10-CM

## 2023-10-04 DIAGNOSIS — F1320 Sedative, hypnotic or anxiolytic dependence, uncomplicated: Secondary | ICD-10-CM | POA: Diagnosis present

## 2023-10-04 DIAGNOSIS — Z818 Family history of other mental and behavioral disorders: Secondary | ICD-10-CM | POA: Diagnosis not present

## 2023-10-04 DIAGNOSIS — Z5982 Transportation insecurity: Secondary | ICD-10-CM

## 2023-10-04 DIAGNOSIS — F250 Schizoaffective disorder, bipolar type: Secondary | ICD-10-CM

## 2023-10-04 DIAGNOSIS — Z91199 Patient's noncompliance with other medical treatment and regimen due to unspecified reason: Secondary | ICD-10-CM

## 2023-10-04 DIAGNOSIS — F1220 Cannabis dependence, uncomplicated: Secondary | ICD-10-CM | POA: Diagnosis not present

## 2023-10-04 DIAGNOSIS — F3160 Bipolar disorder, current episode mixed, unspecified: Secondary | ICD-10-CM | POA: Diagnosis present

## 2023-10-04 DIAGNOSIS — F1210 Cannabis abuse, uncomplicated: Secondary | ICD-10-CM | POA: Diagnosis present

## 2023-10-04 DIAGNOSIS — Z811 Family history of alcohol abuse and dependence: Secondary | ICD-10-CM

## 2023-10-04 DIAGNOSIS — F17210 Nicotine dependence, cigarettes, uncomplicated: Secondary | ICD-10-CM | POA: Diagnosis present

## 2023-10-04 DIAGNOSIS — I4891 Unspecified atrial fibrillation: Secondary | ICD-10-CM | POA: Diagnosis present

## 2023-10-04 DIAGNOSIS — Z560 Unemployment, unspecified: Secondary | ICD-10-CM

## 2023-10-04 DIAGNOSIS — T50916A Underdosing of multiple unspecified drugs, medicaments and biological substances, initial encounter: Secondary | ICD-10-CM | POA: Diagnosis not present

## 2023-10-04 DIAGNOSIS — F4310 Post-traumatic stress disorder, unspecified: Secondary | ICD-10-CM | POA: Diagnosis present

## 2023-10-04 DIAGNOSIS — F411 Generalized anxiety disorder: Secondary | ICD-10-CM | POA: Diagnosis present

## 2023-10-04 DIAGNOSIS — Z5986 Financial insecurity: Secondary | ICD-10-CM | POA: Diagnosis not present

## 2023-10-04 DIAGNOSIS — F17290 Nicotine dependence, other tobacco product, uncomplicated: Secondary | ICD-10-CM | POA: Diagnosis present

## 2023-10-04 DIAGNOSIS — Z91410 Personal history of adult physical and sexual abuse: Secondary | ICD-10-CM

## 2023-10-04 DIAGNOSIS — F319 Bipolar disorder, unspecified: Secondary | ICD-10-CM | POA: Diagnosis not present

## 2023-10-04 DIAGNOSIS — Z91128 Patient's intentional underdosing of medication regimen for other reason: Secondary | ICD-10-CM

## 2023-10-04 MED ORDER — OLANZAPINE 10 MG PO TBDP: 10.0000 mg | ORAL_TABLET | Freq: Three times a day (TID) | ORAL | Status: DC | PRN

## 2023-10-04 MED ORDER — ARIPIPRAZOLE 5 MG PO TABS
5.0000 mg | ORAL_TABLET | Freq: Every day | ORAL | Status: DC
  Administered 2023-10-05: 5 mg via ORAL
  Filled 2023-10-04 (×3): qty 1

## 2023-10-04 MED ORDER — DIPHENHYDRAMINE HCL 25 MG PO CAPS: 50.0000 mg | ORAL_CAPSULE | Freq: Three times a day (TID) | ORAL | Status: DC | PRN

## 2023-10-04 MED ORDER — HALOPERIDOL 5 MG PO TABS: 5.0000 mg | ORAL_TABLET | Freq: Three times a day (TID) | ORAL | Status: DC | PRN

## 2023-10-04 MED ORDER — ALUM & MAG HYDROXIDE-SIMETH 200-200-20 MG/5ML PO SUSP: 30.0000 mL | ORAL | Status: DC | PRN

## 2023-10-04 MED ORDER — ARIPIPRAZOLE 5 MG PO TABS
5.0000 mg | ORAL_TABLET | Freq: Every day | ORAL | Status: DC
  Administered 2023-10-04: 5 mg via ORAL
  Filled 2023-10-04: qty 1

## 2023-10-04 MED ORDER — MAGNESIUM HYDROXIDE 400 MG/5ML PO SUSP: 30.0000 mL | Freq: Every day | ORAL | Status: DC | PRN

## 2023-10-04 MED ORDER — HYDROXYZINE HCL 50 MG PO TABS
50.0000 mg | ORAL_TABLET | Freq: Three times a day (TID) | ORAL | Status: DC | PRN
Start: 2023-10-04 — End: 2023-10-11
  Filled 2023-10-04 (×2): qty 1

## 2023-10-04 MED ORDER — ONDANSETRON 4 MG PO TBDP
4.0000 mg | ORAL_TABLET | Freq: Three times a day (TID) | ORAL | Status: DC | PRN
Start: 2023-10-04 — End: 2023-10-11
  Administered 2023-10-07 – 2023-10-10 (×2): 4 mg via ORAL
  Filled 2023-10-04 (×2): qty 1

## 2023-10-04 MED ORDER — ACETAMINOPHEN 325 MG PO TABS
650.0000 mg | ORAL_TABLET | Freq: Four times a day (QID) | ORAL | Status: DC | PRN
  Administered 2023-10-05 – 2023-10-11 (×7): 650 mg via ORAL
  Filled 2023-10-04 (×8): qty 2

## 2023-10-04 MED ORDER — HYDROXYZINE HCL 25 MG PO TABS: 50.0000 mg | ORAL_TABLET | Freq: Three times a day (TID) | ORAL | Status: DC | PRN

## 2023-10-04 MED ORDER — CLONIDINE HCL 0.1 MG PO TABS
0.1000 mg | ORAL_TABLET | Freq: Once | ORAL | Status: DC
  Filled 2023-10-04 (×2): qty 1

## 2023-10-04 NOTE — Tx Team (Signed)
 Initial Treatment Plan 10/04/2023 7:53 PM Liboria Putnam Cinelli [id number]    PATIENT STRESSORS: Financial difficulties   Medication change or noncompliance     PATIENT STRENGTHS: Average or above average intelligence  Capable of independent living  Communication skills  Motivation for treatment/growth  Supportive family/friends  Work skills    PATIENT IDENTIFIED PROBLEMS: Mania    Crying spells  Agitation   "Money"             DISCHARGE CRITERIA:  Improved stabilization in mood, thinking, and/or behavior Need for constant or close observation no longer present Reduction of life-threatening or endangering symptoms to within safe limits Verbal commitment to aftercare and medication compliance  PRELIMINARY DISCHARGE PLAN: Outpatient therapy Return to previous living arrangement  PATIENT/FAMILY INVOLVEMENT: This treatment plan has been presented to and reviewed with the patient, Ieshia Hatcher Ligas, The patient has been given the opportunity to ask questions and make suggestions.  Shela Nevin, RN 10/04/2023, 7:53 PM

## 2023-10-04 NOTE — Plan of Care (Signed)
   Problem: Education: Goal: Knowledge of Veteran General Education information/materials will improve Outcome: Progressing Goal: Emotional status will improve Outcome: Progressing Goal: Verbalization of understanding the information provided will improve Outcome: Progressing

## 2023-10-04 NOTE — Progress Notes (Signed)
 Pt has been accepted to [facility] on 10/04/2023 Bed assignment: 300-2  Pt meets inpatient criteria per: Ophelia Shoulder NP  Attending Physician will be: Dr. Sarita Bottom, MD   Report can be called to:Adult unit: [phone]  Pt can arrive after  pending a note stating pt has been informed she will have a room mate at [facility] and verbalized understanding. DX Schizoaffective D.O. Bipolar Type. Attending MD Nadir Abbott Pao. RN TO RN report to [phone]. IVC paperwork in record is INCOMPLETE. I will need findings and custody paperwork including front and back with part A and B where pt is served to be uploaded.    Care Team Notified: [facility] Malva Limes RN, Gaetano Net RN, Ophelia Shoulder NP,   [place] Ellenie Salome LCSW-A   10/04/2023 1:13 PM

## 2023-10-04 NOTE — Progress Notes (Signed)
 Patient is a 33 year old female with hx of schizoaffective disorder, bipolar type, who presented under IVC from [facility] for manic behavior. Pt reported that she was "just picking up trash", but per report, [place] the [place] undressing and acting erratically.Pt presented calm and cooperative, answered questions logically and coherently during admission interview and assessment. . Pt reported only drinking alcohol socially, but smokes weed daily. Pt denied other illicit drug use. Pt reported that she quit taking her medications about 2 years ago, because she tries to live 'holistically'.  Pt currently denies SI/HI, A/H and paranoia.VS monitored and recorded. Skin check performed with MHT. Belongings searched and secured in locker. Patient was oriented to unit and schedule. Pt provided with po fluids. Q 15 min checks initiated for safety.

## 2023-10-04 NOTE — ED Notes (Signed)
 Pt informed a bed is available at [facility] and they will have a roommate. Pt is ok with the plan.

## 2023-10-04 NOTE — Progress Notes (Signed)
   10/04/23 2155  Psych Admission Type (Psych Patients Only)  Admission Status Voluntary  Psychosocial Assessment  Patient Complaints None  Eye Contact Brief  Facial Expression Animated  Affect Appropriate to circumstance  Speech Logical/coherent  Interaction Assertive  Motor Activity Other (Comment) (WDL)  Appearance/Hygiene In scrubs  Behavior Characteristics Appropriate to situation  Mood Pleasant  Thought Process  Coherency WDL  Content Paranoia  Delusions Paranoid  Perception UTA  Hallucination None reported or observed  Judgment Impaired  Confusion None  Danger to Self  Current suicidal ideation? Denies  Danger to Others  Danger to Others None reported or observed

## 2023-10-04 NOTE — ED Notes (Signed)
 Pt showered and spoke with friend on the phone
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
## 2023-10-04 NOTE — Group Note (Unsigned)
 Date:  10/05/2023 Time:  1:04 AM  Group Topic/Focus:  Wrap-Up Group:   The focus of this group is to help patients review their daily goal of treatment and discuss progress on daily workbooks.    Participation Level:  Active  Participation Quality:  Appropriate and Sharing  Affect:  Appropriate  Cognitive:  Appropriate  Insight: Appropriate  Engagement in Group:  Engaged  Modes of Intervention:  Discussion and Socialization  Additional Comments:  Patients used wrap up group sheets during group and shared.   Kennieth Francois 10/05/2023, 1:04 AM

## 2023-10-04 NOTE — BH Assessment (Signed)
 Patient was deferred to [facility] for a telepsych assessment. The assigned care coordinator will provide updates regarding the scheduling of the assessment. [facility] can be reached at [phone] for further information on the timing of the telepsych evaluation.

## 2023-10-04 NOTE — ED Provider Notes (Signed)
 Emergency Medicine Observation Re-evaluation Note  Kelly Crosby is a 33 y.o. female, seen on rounds today.  Pt initially presented to the ED for complaints of Manic Behavior Currently, the patient is sleeping.  Physical Exam  BP (!) 132/100 (BP Location: Right Arm)   Pulse 72   Temp 98.1 F (36.7 C) (Oral)   Resp 12   SpO2 100%  Physical Exam General: No acute distress Cardiac: Normal rate Lungs: No increased work of breathing Psych: Calm  ED Course / MDM  EKG:   I have reviewed the labs performed to date as well as medications administered while in observation.  Recent changes in the last 24 hours include none.  Plan  Current plan is for inpatient placement.    Rolan Bucco, MD 10/04/23 [phone]

## 2023-10-05 ENCOUNTER — Encounter (HOSPITAL_COMMUNITY): Payer: Self-pay

## 2023-10-05 DIAGNOSIS — F319 Bipolar disorder, unspecified: Secondary | ICD-10-CM

## 2023-10-05 LAB — VITAMIN D 25 HYDROXY (VIT D DEFICIENCY, FRACTURES): Vit D, 25-Hydroxy: 14.22 ng/mL — ABNORMAL LOW (ref 30–100)

## 2023-10-05 MED ORDER — ARIPIPRAZOLE 10 MG PO TABS
10.0000 mg | ORAL_TABLET | Freq: Every day | ORAL | Status: DC
  Filled 2023-10-05 (×2): qty 1

## 2023-10-05 MED ORDER — ARIPIPRAZOLE 10 MG PO TABS
10.0000 mg | ORAL_TABLET | Freq: Every day | ORAL | Status: DC
  Administered 2023-10-06 – 2023-10-10 (×5): 10 mg via ORAL
  Filled 2023-10-05 (×7): qty 1

## 2023-10-05 MED ORDER — TRAZODONE HCL 50 MG PO TABS
50.0000 mg | ORAL_TABLET | Freq: Every evening | ORAL | Status: DC | PRN
  Filled 2023-10-05: qty 1

## 2023-10-05 NOTE — Progress Notes (Signed)
   10/05/23 0525  15 Minute Checks  Location Bedroom  Visual Appearance Calm  Behavior Composed  Sleep (Behavioral Health Patients Only)  Calculate sleep? (Click Yes once per 24 hr at 0600 safety check) Yes  Documented sleep last 24 hours 6

## 2023-10-05 NOTE — Group Note (Signed)
 Occupational Therapy Group Note  Group Topic: Sleep Hygiene  Group Date: 10/05/2023 Start Time: 1430 End Time: 1500 Facilitators: Ted Mcalpine, OT   Group Description: Group encouraged increased participation and engagement through topic focused on sleep hygiene. Patients reflected on the quality of sleep they typically receive and identified areas that need improvement. Group was given background information on sleep and sleep hygiene, including common sleep disorders. Group members also received information on how to improve one's sleep and introduced a sleep diary as a tool that can be utilized to track sleep quality over a length of time. Group session ended with patients identifying one or more strategies they could utilize or implement into their sleep routine in order to improve overall sleep quality.        Therapeutic Goal(s):  Identify one or more strategies to improve overall sleep hygiene  Identify one or more areas of sleep that are negatively impacted (sleep too much, too little, etc)     Participation Level: Engaged   Participation Quality: Independent   Behavior: Appropriate   Speech/Thought Process: Relevant   Affect/Mood: Appropriate   Insight: Fair   Judgement: Fair      Modes of Intervention: Education  Patient Response to Interventions:  Engaged   Plan: Continue to engage patient in OT groups 2 - 3x/week.  10/05/2023  Ted Mcalpine, OT  Kerrin Champagne, OT

## 2023-10-05 NOTE — BH IP Treatment Plan (Signed)
 Interdisciplinary Treatment and Diagnostic Plan Update  10/05/2023 Time of Session: 1038 Kelly Crosby MRN: [id number]  Principal Diagnosis: Bipolar 1 disorder (HCC)  Secondary Diagnoses: Principal Problem:   Bipolar 1 disorder (HCC)   Current Medications:  Current Facility-Administered Medications  Medication Dose Route Frequency Provider Last Rate Last Admin   acetaminophen (TYLENOL) tablet 650 mg  650 mg Oral Q6H PRN Chales Abrahams, NP   650 mg at 10/05/23 0424   alum & mag hydroxide-simeth (MAALOX/MYLANTA) 200-200-20 MG/5ML suspension 30 mL  30 mL Oral Q4H PRN Chales Abrahams, NP       ARIPiprazole (ABILIFY) tablet 5 mg  5 mg Oral Daily Ophelia Shoulder E, NP   5 mg at 10/05/23 2725   cloNIDine (CATAPRES) tablet 0.1 mg  0.1 mg Oral Once Lauro Franklin, MD       haloperidol (HALDOL) tablet 5 mg  5 mg Oral TID PRN Chales Abrahams, NP       And   diphenhydrAMINE (BENADRYL) capsule 50 mg  50 mg Oral TID PRN Chales Abrahams, NP       hydrOXYzine (ATARAX) tablet 50 mg  50 mg Oral TID PRN Chales Abrahams, NP       magnesium hydroxide (MILK OF MAGNESIA) suspension 30 mL  30 mL Oral Daily PRN Ophelia Shoulder E, NP       ondansetron (ZOFRAN-ODT) disintegrating tablet 4 mg  4 mg Oral Q8H PRN Ophelia Shoulder E, NP       PTA Medications: Medications Prior to Admission  Medication Sig Dispense Refill Last Dose/Taking   Multiple Vitamin (MULTIVITAMIN WITH MINERALS) TABS tablet Take 1 tablet by mouth daily.   Past Week   Norethindrone Acetate-Ethinyl Estrad-FE (AUROVELA 24 FE) 1-20 MG-MCG(24) tablet Take 1 tablet by mouth daily.   Past Week   ondansetron (ZOFRAN-ODT) 4 MG disintegrating tablet Take 1 tablet (4 mg total) by mouth every 8 (eight) hours as needed for nausea or vomiting. 4mg  ODT q4 hours prn nausea/vomit 30 tablet 1 Past Week    Patient Stressors: Financial difficulties   Medication change or noncompliance    Patient Strengths: Average or above average intelligence  Capable of  independent living  Communication skills  Motivation for treatment/growth  Supportive family/friends  Work skills   Treatment Modalities: Medication Management, Group therapy, Case management,  1 to 1 session with clinician, Psychoeducation, Recreational therapy.   Physician Treatment Plan for Primary Diagnosis: Bipolar 1 disorder (HCC) Long Term Goal(s):     Short Term Goals:    Medication Management: Evaluate patient's response, side effects, and tolerance of medication regimen.  Therapeutic Interventions: 1 to 1 sessions, Unit Group sessions and Medication administration.  Evaluation of Outcomes: Not Progressing  Physician Treatment Plan for Secondary Diagnosis: Principal Problem:   Bipolar 1 disorder (HCC)  Long Term Goal(s):     Short Term Goals:       Medication Management: Evaluate patient's response, side effects, and tolerance of medication regimen.  Therapeutic Interventions: 1 to 1 sessions, Unit Group sessions and Medication administration.  Evaluation of Outcomes: Not Progressing   RN Treatment Plan for Primary Diagnosis: Bipolar 1 disorder (HCC) Long Term Goal(s): Knowledge of disease and therapeutic regimen to maintain health will improve  Short Term Goals: Ability to remain free from injury will improve, Ability to verbalize frustration and anger appropriately will improve, Ability to participate in decision making will improve, Ability to verbalize feelings will improve, and Ability to identify and develop effective coping  behaviors will improve  Medication Management: RN will administer medications as ordered by provider, will assess and evaluate patient's response and provide education to patient for prescribed medication. RN will report any adverse and/or side effects to prescribing provider.  Therapeutic Interventions: 1 on 1 counseling sessions, Psychoeducation, Medication administration, Evaluate responses to treatment, Monitor vital signs and CBGs as  ordered, Perform/monitor CIWA, COWS, AIMS and Fall Risk screenings as ordered, Perform wound care treatments as ordered.  Evaluation of Outcomes: Not Progressing   LCSW Treatment Plan for Primary Diagnosis: Bipolar 1 disorder (HCC) Long Term Goal(s): Safe transition to appropriate next level of care at discharge, Engage patient in therapeutic group addressing interpersonal concerns.  Short Term Goals: Engage patient in aftercare planning with referrals and resources, Increase ability to appropriately verbalize feelings, Increase emotional regulation, Identify triggers associated with mental health/substance abuse issues, and Increase skills for wellness and recovery  Therapeutic Interventions: Assess for all discharge needs, 1 to 1 time with Social worker, Explore available resources and support systems, Assess for adequacy in community support network, Educate family and significant other(s) on suicide prevention, Complete Psychosocial Assessment, Interpersonal group therapy.  Evaluation of Outcomes: Not Progressing   Progress in Treatment: Attending groups: Yes. Participating in groups: Yes. Taking medication as prescribed: Yes. Toleration medication: Yes. Family/Significant other contact made: No, will contact:  CSW assessment pending Patient understands diagnosis: Yes. Discussing patient identified problems/goals with staff: Yes. Medical problems stabilized or resolved: Yes. Denies suicidal/homicidal ideation: Yes. Issues/concerns per patient self-inventory: No. Other: None  New problem(s) identified: No, Describe:  None  New Short Term/Long Term Goal(s): medication stabilization, elimination of SI thoughts, development of comprehensive mental wellness plan.    Patient Goals: "Re-establish resources for better coping"  Discharge Plan or Barriers:   Reason for Continuation of Hospitalization: Medication stabilization Other; describe mood stabilization, discharge  planning  Estimated Length of Stay:  Last 3 [place] Suicide Severity Risk Score: Flowsheet Row Admission (Current) [facility] from 10/03/2023 in [facility] at [facility] from 05/23/2023 in [facility] Department at [facility]  C-SSRS RISK CATEGORY No Risk No Risk No Risk       Last PHQ 2/9 Scores:    06/17/2023    3:17 PM 01/28/2023    5:27 PM 05/05/2022    3:26 PM  Depression screen PHQ 2/9  Decreased Interest 0 1 3  Down, Depressed, Hopeless 0 1 2  PHQ - 2 Score 0 2 5  Altered sleeping  3 2  Tired, decreased energy  2 3  Change in appetite  2 2  Feeling bad or failure about yourself   1 2  Trouble concentrating  1 3  Moving slowly or fidgety/restless  0 1  Suicidal thoughts  0 0  PHQ-9 Score  11 18  Difficult doing work/chores   Somewhat difficult    Scribe for Treatment Team: Jacinta Shoe, LCSW 10/05/2023 2:20 PM

## 2023-10-05 NOTE — [facility] Group Notes (Signed)
 Spiritual care group on grief and loss facilitated by Chaplain Dyanne Carrel, [facility]  Group Goal: Support / Education around grief and loss  Members engage in facilitated group support and psycho-social education.  Group Description:  Following introductions and group rules, group members engaged in facilitated group dialogue and support around topic of loss, with particular support around experiences of loss in their lives. Group Identified types of loss (relationships / self / things) and identified patterns, circumstances, and changes that precipitate losses. Reflected on thoughts / feelings around loss, normalized grief responses, and recognized variety in grief experience. Group encouraged individual reflection on safe space and on the coping skills that they are already utilizing.  Group drew on Adlerian / Rogerian and narrative framework  Patient Progress: Kelly Crosby attended the first part of group but became drowsy due to medication and went to lie down.

## 2023-10-05 NOTE — Progress Notes (Signed)
   10/05/23 2300  Psych Admission Type (Psych Patients Only)  Admission Status Voluntary  Psychosocial Assessment  Patient Complaints Worrying (Pt appeared sleepy and reported it was from Abilify which she took in am.  Provider changed to bedtime to start tomorrow.  Pt appeared worried and waited by the phone for her daughter to call for about 30 minutes.)  Eye Contact Brief  Facial Expression Worried  Affect Appropriate to circumstance  Speech Logical/coherent  Interaction Assertive;Cautious;Guarded (Pt stated she had a good conversation with staff today and peers have been supportive.  She denied SI/HI/AVH)  Appearance/Hygiene Unremarkable;In scrubs  Behavior Characteristics Cooperative;Appropriate to situation;Guarded  Mood Preoccupied;Apprehensive  Thought Process  Coherency WDL  Content WDL  Delusions None reported or observed  Perception WDL  Hallucination None reported or observed  Judgment Impaired  Confusion None  Danger to Self  Current suicidal ideation? Denies  Self-Injurious Behavior No self-injurious ideation or behavior indicators observed or expressed   Agreement Not to Harm Self Yes  Description of Agreement Verbal  Danger to Others  Danger to Others None reported or observed
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
## 2023-10-05 NOTE — Plan of Care (Signed)
   Problem: Education: Goal: Emotional status will improve Outcome: Progressing Goal: Mental status will improve Outcome: Progressing   Problem: Activity: Goal: Interest or engagement in activities will improve Outcome: Progressing Goal: Sleeping patterns will improve Outcome: Progressing

## 2023-10-05 NOTE — Plan of Care (Signed)
   Problem: Coping: Goal: Coping ability will improve Outcome: Progressing Goal: Will verbalize feelings Outcome: Progressing

## 2023-10-05 NOTE — Progress Notes (Signed)
 Patient reports that taking the Abilify in the morning is making her extremely drowsy and requests that the MD change the medication to nighttime. Pt does appear to be drowsy throughout the day. Writer informed pt to discuss medication change with her provider in the morning.

## 2023-10-05 NOTE — Plan of Care (Signed)
   Problem: Education: Goal: Knowledge of [place] [facility] Education information/materials will improve Outcome: Progressing

## 2023-10-05 NOTE — Group Note (Signed)
 Recreation Therapy Group Note   Group Topic:Health and Wellness  Group Date: 10/05/2023 Start Time: 0931 End Time: 0950 Facilitators: Zyrus Hetland-McCall, LRT,CTRS Location: [address] Dayroom   Group Topic: Wellness  Goal Area(s) Addresses:  Patient will define components of whole wellness. Patient will verbalize benefit of whole wellness.  Intervention: Music  Activity: Exercise. LRT instructed patients they would take turns leading the group in the exercises of their choosing. Patients were encouraged to give their best but if they needed to take a break or get water to do so. Patients were also encouraged to give their best effort throughout group session.   Education: Wellness, Building control surveyor.   Education Outcome: Acknowledges education/In group clarification offered/Needs additional education.   Affect/Mood: Appropriate   Participation Level: Engaged   Participation Quality: Independent   Behavior: Appropriate   Speech/Thought Process: Focused   Insight: Good   Judgement: Good   Modes of Intervention: Music   Patient Response to Interventions:  Engaged   Education Outcome:  In group clarification offered    Clinical Observations/Individualized Feedback: Pt was bright and engaged. Pt was focused and able to complete exercises presented in group. Pt was social with peers and upbeat during group.     Plan: Continue to engage patient in RT group sessions 2-3x/week.   Satish Hammers-McCall, LRT,CTRS 10/05/2023 12:22 PM

## 2023-10-06 DIAGNOSIS — F319 Bipolar disorder, unspecified: Secondary | ICD-10-CM | POA: Diagnosis not present

## 2023-10-06 MED ORDER — OLANZAPINE 10 MG IM SOLR: 5.0000 mg | Freq: Three times a day (TID) | INTRAMUSCULAR | Status: DC | PRN

## 2023-10-06 MED ORDER — OLANZAPINE 5 MG PO TBDP: 5.0000 mg | ORAL_TABLET | Freq: Three times a day (TID) | ORAL | Status: DC | PRN

## 2023-10-06 MED ORDER — VITAMIN D3 25 MCG PO TABS
1000.0000 [IU] | ORAL_TABLET | Freq: Every day | ORAL | Status: DC
  Administered 2023-10-06 – 2023-10-11 (×6): 1000 [IU] via ORAL
  Filled 2023-10-06 (×9): qty 1

## 2023-10-06 MED ORDER — OLANZAPINE 10 MG IM SOLR: 10.0000 mg | Freq: Three times a day (TID) | INTRAMUSCULAR | Status: DC | PRN

## 2023-10-06 MED ORDER — CLONIDINE HCL 0.1 MG PO TABS: 0.1000 mg | ORAL_TABLET | ORAL | Status: DC | PRN

## 2023-10-06 NOTE — [facility] Group Notes (Signed)
 Adult Psychoeducational Group Note  Date:  10/06/2023 Time:  12:58 PM  Group Topic/Focus:  Emotional Education:   The focus of this group is to discuss what feelings/emotions are, and how they are experienced. Goals Group:   The focus of this group is to help patients establish daily goals to achieve during treatment and discuss how the patient can incorporate goal setting into their daily lives to aide in recovery. Orientation:   The focus of this group is to educate the patient on the purpose and policies of crisis stabilization and provide a format to answer questions about their admission.  The group details unit policies and expectations of patients while admitted.  Participation Level:  Active  Participation Quality:  Appropriate  Affect:  Appropriate  Cognitive:  Appropriate  Insight: Appropriate  Engagement in Group:  Engaged  Modes of Intervention:  Discussion  Additional Comments:  Pt goal is medication management.   Lucilla Edin 10/06/2023, 12:58 PM

## 2023-10-06 NOTE — [facility] Group Notes (Signed)
 [facility] Group Notes:  (Nursing/MHT/Case Management/Adjunct)  Date:  10/06/2023  Time:  10:22 PM  Type of Therapy:   Wrap-up group  Participation Level:  Did Not Attend  Participation Quality:    Affect:    Cognitive:    Insight:    Engagement in Group:    Modes of Intervention:    Summary of Progress/Problems:  Noah Delaine 10/06/2023, 10:22 PM

## 2023-10-06 NOTE — [facility] Suicide Risk Assessment (Signed)
 [facility] INPATIENT:  Family/Significant Other Suicide Prevention Education  Suicide Prevention Education:  Contact Attempts: [name] - mother [phone]), has been identified by the patient as the family member/significant other with whom the patient will be residing, and identified as the person(s) who will aid the patient in the event of a mental health crisis.  With written consent from the patient, two attempts were made to provide suicide prevention education, prior to and/or following the patient's discharge.  We were unsuccessful in providing suicide prevention education.  A suicide education pamphlet was given to the patient to share with family/significant other.  Date and time of first attempt: 10/06/23/3:33PM Date and time of second attempt:  Joelyn Oms [place], LCSW 10/06/2023, 4:50 PM

## 2023-10-06 NOTE — Group Note (Signed)
 Recreation Therapy Group Note   Group Topic:Animal Assisted Therapy   Group Date: 10/06/2023 Start Time: 0945 End Time: 1030 Facilitators: Stephen Turnbaugh-McCall, LRT,CTRS Location: [address] Dayroom   Animal-Assisted Activity (AAA) Program Checklist/Progress Notes Patient Eligibility Criteria Checklist & Daily Group note for Rec Tx Intervention  AAA/T Program Assumption of Risk Form signed by Patient/ or Parent Legal Guardian Yes  Patient is free of allergies or severe asthma Yes  Patient reports no fear of animals Yes  Patient reports no history of cruelty to animals Yes  Patient understands his/her participation is voluntary Yes  Patient washes hands before animal contact Yes  Patient washes hands after animal contact Yes  Education: Hand Washing, Appropriate Animal Interaction   Education Outcome: Acknowledges education.    Affect/Mood: Appropriate   Participation Level: Engaged   Participation Quality: Independent   Behavior: Appropriate   Speech/Thought Process: Focused   Insight: Good   Judgement: Good   Modes of Intervention: Teaching laboratory technician   Patient Response to Interventions:  Engaged   Education Outcome:  In group clarification offered    Clinical Observations/Individualized Feedback: Patient attended session and interacted appropriately with therapy dog and peers. Patient asked appropriate questions about therapy dog and his training. Patient shared stories about their pets at home with group.     Plan: Continue to engage patient in RT group sessions 2-3x/week.   Harinder Romas-McCall, LRT,CTRS 10/06/2023 1:32 PM

## 2023-10-06 NOTE — Plan of Care (Signed)
  Problem: Physical Regulation: Goal: Ability to maintain clinical measurements within normal limits will improve Outcome: Progressing   Problem: Nutritional: Goal: Ability to achieve adequate nutritional intake will improve Outcome: Progressing   Problem: Self-Care: Goal: Ability to participate in self-care as condition permits will improve Outcome: Progressing

## 2023-10-06 NOTE — Group Note (Signed)
 LCSW Group Therapy Note   Group Date: 10/06/2023 Start Time: 1100 End Time: 1200  Participation:  patient was present  and actively participated in the conversation.  She was insightful and shared personal experiences.   Type of Therapy:  Group Therapy  Topic:  Healing From Within: Understanding Our Past, Building Our Future   Objective:  To help participants understand the impact of early experiences on mental and physical health, with a focus on Adverse Childhood Experiences (ACEs), and to explore ways to build resilience and healing.  Goals: Understand ACEs and Their Impact: Learn how childhood experiences shape mental and physical health. Build Resilience: Develop strategies for overcoming challenges and creating positive change. Promote Healing: Recognize the value of support and the possibility of healing through therapy and self-care.  Therapeutic Modalities Used: Psychoeducation: Sharing information about ACEs and their effects. Cognitive Behavioral Therapy (CBT): Helping reframe negative thought patterns. Trauma-Informed Therapy: Creating a safe, supportive space for healing.   Maicie Vanderloop O Illyana Schorsch, LCSWA 10/06/2023  1:25 PM

## 2023-10-06 NOTE — Plan of Care (Signed)
  Problem: Coping: Goal: Ability to demonstrate self-control will improve Outcome: Progressing   Problem: Health Behavior/Discharge Planning: Goal: Compliance with treatment plan for underlying cause of condition will improve Outcome: Progressing   Problem: Physical Regulation: Goal: Ability to maintain clinical measurements within normal limits will improve Outcome: Progressing   
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
## 2023-10-07 DIAGNOSIS — F319 Bipolar disorder, unspecified: Secondary | ICD-10-CM | POA: Diagnosis not present

## 2023-10-07 MED ORDER — WHITE PETROLATUM EX OINT
TOPICAL_OINTMENT | CUTANEOUS | Status: AC
  Filled 2023-10-07: qty 5

## 2023-10-07 NOTE — [facility] Suicide Risk Assessment (Signed)
 [facility] INPATIENT:  Family/Significant Other Suicide Prevention Education  Suicide Prevention Education:  Education Completed; [name] - mother [phone]), has been identified by the patient as the family member/significant other with whom the patient will be residing, and identified as the person(s) who will aid the patient in the event of a mental health crisis (suicidal ideations/suicide attempt).  With written consent from the patient, the family member/significant other has been provided the following suicide prevention education, prior to the and/or following the discharge of the patient.  Spoke with Pt's mother who states that she has spoken to Pt on a couple of occasions since admission. In speaking with Pt today she reports that Pt has been positive in discussion with no issues. Mother reported that outside of hospital the two talk everyday and have a healthy relationship. She does have concern about [place] remaining compliant with medications at discharge.CSW explained that if Pt is agreeable she can transition to LAI, which can increase probability of compliance. Mother provided with direct line.  The suicide prevention education provided includes the following: Suicide risk factors Suicide prevention and interventions National Suicide Hotline telephone number [facility] assessment telephone number [facility] Emergency Assistance 911 [facility] and/or Residential Mobile Crisis Unit telephone number  Request made of family/significant other to: Remove weapons (e.g., guns, rifles, knives), all items previously/currently identified as safety concern.   Remove drugs/medications (over-the-counter, prescriptions, illicit drugs), all items previously/currently identified as a safety concern.  The family member/significant other verbalizes understanding of the suicide prevention education information provided.  The family member/significant other agrees to remove the  items of safety concern listed above.  Joelyn Oms [place], LCSW 10/07/2023, 1:46 PM

## 2023-10-07 NOTE — [facility] Group Notes (Signed)
 Adult Psychoeducational Group Note  Date:  10/07/2023 Time:  9:40 PM  Group Topic/Focus:  Wrap-Up Group:   The focus of this group is to help patients review their daily goal of treatment and discuss progress on daily workbooks.  Participation Level:  Active  Participation Quality:  Appropriate  Affect:  Appropriate  Cognitive:  Appropriate  Insight: Appropriate  Engagement in Group:  Engaged  Modes of Intervention:  Discussion  Additional Comments:    Chauncey Fischer 10/07/2023, 9:40 PM

## 2023-10-07 NOTE — Plan of Care (Signed)
  Problem: Coping: Goal: Coping ability will improve Outcome: Progressing Goal: Will verbalize feelings Outcome: Progressing   Problem: Role Relationship: Goal: Ability to communicate needs accurately will improve Outcome: Progressing Goal: Ability to interact with others will improve Outcome: Progressing

## 2023-10-07 NOTE — Plan of Care (Signed)

## 2023-10-07 NOTE — Progress Notes (Signed)
 D: Patient is alert, oriented, and cooperative. Denies SI, HI, AVH, and verbally contracts for safety. Patient reports she slept fair last night without sleeping medication. Patient reports her appetite as good, energy level as high, and concentration as good. Patient rates her depression 0/10, hopelessness 0/10, and anxiety 1/10. Patient states her goal is "to go home with a structured treatment plan".   A: Scheduled medications administered per MD order. Support provided. Patient educated on safety on the unit and medications. Routine safety checks every 15 minutes. Patient stated understanding to tell nurse about any new physical symptoms. Patient understands to tell staff of any needs.     R: No adverse drug reactions noted. Patient remains safe at this time and will continue to monitor.    10/07/23 1000  Psych Admission Type (Psych Patients Only)  Admission Status Voluntary  Psychosocial Assessment  Patient Complaints Anxiety;Sleep disturbance  Eye Contact Brief  Facial Expression Animated;Anxious  Affect Appropriate to circumstance  Speech Logical/coherent  Interaction Assertive  Motor Activity Restless  Appearance/Hygiene Unremarkable  Behavior Characteristics Cooperative  Mood Preoccupied;Anxious  Thought Process  Coherency Circumstantial  Content Preoccupation  Delusions None reported or observed  Perception WDL  Hallucination None reported or observed  Judgment Impaired  Confusion None  Danger to Self  Current suicidal ideation? Denies  Self-Injurious Behavior No self-injurious ideation or behavior indicators observed or expressed   Agreement Not to Harm Self Yes  Description of Agreement verbal  Danger to Others  Danger to Others None reported or observed

## 2023-10-07 NOTE — Progress Notes (Signed)
   10/06/23 2100  Psych Admission Type (Psych Patients Only)  Admission Status Voluntary  Psychosocial Assessment  Patient Complaints Anxiety;Sleep disturbance (Pt is forward thinking to discharge and stressed about paying bills. Pt verbalized making an effort in treatment to participate in groups, drink water, and enjoy pet therapy visit.)  Eye Contact Fair  Facial Expression Other (Comment) (congruent with thought/mood)  Affect Constricted;Sad  Speech Logical/coherent  Interaction Assertive  Appearance/Hygiene Unremarkable  Behavior Characteristics Cooperative  Mood Preoccupied;Depressed (Pt is less sedated d/t no Abilify today. Pt was given 10mg  Abilify at bedtime and was up at 12 am d/t biting inner cheek. provided supportive listening and pt declined meds for anxiety or sleep.  Provided hotpack for pain in left side of torso.)  Thought Process  Coherency WDL  Content Blaming self  Delusions None reported or observed  Perception WDL  Hallucination None reported or observed  Judgment Impaired  Confusion None  Danger to Self  Current suicidal ideation? Denies  Self-Injurious Behavior No self-injurious ideation or behavior indicators observed or expressed   Agreement Not to Harm Self Yes  Description of Agreement Verbal

## 2023-10-07 NOTE — [facility] Group Notes (Signed)
 Spirituality group facilitated by Kathleen Argue, BCC.  Group Description: Group focused on topic of hope. Patients participated in facilitated discussion around topic, connecting with one another around experiences and definitions for hope. Group members engaged with visual explorer photos, reflecting on what hope looks like for them today. Group engaged in discussion around how their definitions of hope are present today in hospital.  Modalities: Psycho-social ed, Adlerian, Narrative, MI  Patient Progress: Kelly Crosby attended group and actively engaged and participated in group conversation and activities.  Comments demonstrated good insight and contributed positively to the group conversation.

## 2023-10-07 NOTE — Progress Notes (Signed)
 Pt appeared depressed and distressed about visit with daughter not going as well as she had hoped. Pt spoke in rapid manner about her day and the stressors including getting her daughter accepted to be able to visit.  She discussed difficulties with not having a car and getting to appointments. Pt spoke about financial challenges. Pt asserted that she wants to be a peer support specialist to help others that are going through similar issues as her. Pt appeared brighter and shared how she wants to inspire others and impact lives. Encouraged pt to have patience and kindness to self and develop a stronger social support including therapy.    10/07/23 2300  Psych Admission Type (Psych Patients Only)  Admission Status Voluntary  Psychosocial Assessment  Patient Complaints Sleep disturbance;Crying spells;Other (Comment) (Pt resting in bed on greeting. Pt appeared depressed and reported having a rough day. Daughter visited and was asking her questions about why she was here. Pt shared that her visit didn't go that great.)  Eye Contact Fair  Facial Expression Sad;Pensive  Affect Depressed;Constricted;Other (Comment);Appropriate to circumstance (Pt was somewhat brighter at the end of conversation.)  Speech Logical/coherent  Interaction Assertive;Other (Comment) (Pt opened up about day not going the way she would've liked.)  Appearance/Hygiene Unremarkable;In hospital gown  Behavior Characteristics Cooperative  Mood Depressed;Despair (Pt stated, "I came out of my room even though I didn't feel like it." Pt claimed she went to groups and offered support to new peer on the unit. Pt is aware that she needs to take care of herself the way she cares for others. Pt noted her strengths.)  Thought Process  Coherency Circumstantial  Content Preoccupation  Delusions None reported or observed  Perception WDL  Hallucination None reported or observed  Judgment Impaired  Confusion None  Danger to Self  Current  suicidal ideation? Denies  Self-Injurious Behavior No self-injurious ideation or behavior indicators observed or expressed   Agreement Not to Harm Self Yes  Description of Agreement Verbal  Danger to Others  Danger to Others None reported or observed

## 2023-10-07 NOTE — Plan of Care (Signed)
 ?  Problem: Education: ?Goal: Mental status will improve ?Outcome: Progressing ?Goal: Verbalization of understanding the information provided will improve ?Outcome: Progressing ?  ?
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
## 2023-10-07 NOTE — Group Note (Signed)
 Recreation Therapy Group Note   Group Topic:Stress Management  Group Date: 10/07/2023 Start Time: 1478 End Time: 0952 Facilitators: Emelie Newsom-McCall, LRT,CTRS Location: [address] Hall Dayroom   Group Topic: Stress Management  Goal Area(s) Addresses:  Patient will identify positive stress management techniques. Patient will identify benefits of using stress management post d/c.  Behavioral Response:   Intervention: Insight Timer App  Activity: Meditation. LRT and patients went over meditation before listening to meditation presented in group. LRT played a meditation that focused on self-compassion and speaking positive over ones self throughout the course of the day. The meditation also focused on accepting yourself as you presently are while working towards growth. Patients were to listen and follow along as meditation played to get the most out of it.    Education:  Stress Management, Discharge Planning.   Education Outcome: Acknowledges Education   Affect/Mood: Appropriate   Participation Level: Engaged   Participation Quality: Independent   Behavior: Appropriate and Attentive    Speech/Thought Process: Focused   Insight: Good   Judgement: Good   Modes of Intervention: App   Patient Response to Interventions:  Attentive   Education Outcome:  In group clarification offered    Clinical Observations/Individualized Feedback: Pt attended and participated in group session. Pt was attentive and focused during meditation. Pt was also encouraging and supportive of peers as needed at conclusion of group session.    Plan: Continue to engage patient in RT group sessions 2-3x/week.   Millie Shorb-McCall, LRT,CTRS 10/07/2023 12:23 PM

## 2023-10-08 DIAGNOSIS — F319 Bipolar disorder, unspecified: Secondary | ICD-10-CM | POA: Diagnosis not present

## 2023-10-08 LAB — CBC WITH DIFFERENTIAL/PLATELET
Abs Immature Granulocytes: 0.02 10*3/uL (ref 0.00–0.07)
Basophils Absolute: 0 10*3/uL (ref 0.0–0.1)
Basophils Relative: 1 %
Eosinophils Absolute: 0.2 10*3/uL (ref 0.0–0.5)
Eosinophils Relative: 3 %
HCT: 40.6 % (ref 36.0–46.0)
Hemoglobin: 14.1 g/dL (ref 12.0–15.0)
Immature Granulocytes: 0 %
Lymphocytes Relative: 41 %
Lymphs Abs: 2.9 10*3/uL (ref 0.7–4.0)
MCH: 29.1 pg (ref 26.0–34.0)
MCHC: 34.7 g/dL (ref 30.0–36.0)
MCV: 83.7 fL (ref 80.0–100.0)
Monocytes Absolute: 0.6 10*3/uL (ref 0.1–1.0)
Monocytes Relative: 8 %
Neutro Abs: 3.5 10*3/uL (ref 1.7–7.7)
Neutrophils Relative %: 47 %
Platelets: 359 10*3/uL (ref 150–400)
RBC: 4.85 MIL/uL (ref 3.87–5.11)
RDW: 12.8 % (ref 11.5–15.5)
WBC: 7.3 10*3/uL (ref 4.0–10.5)
nRBC: 0 % (ref 0.0–0.2)

## 2023-10-08 LAB — HEMOGLOBIN A1C
Hgb A1c MFr Bld: 5.1 % (ref 4.8–5.6)
Mean Plasma Glucose: 99.67 mg/dL

## 2023-10-08 LAB — TSH: TSH: 0.841 u[IU]/mL (ref 0.350–4.500)

## 2023-10-08 LAB — LIPID PANEL
Cholesterol: 154 mg/dL (ref 0–200)
HDL: 39 mg/dL — ABNORMAL LOW (ref >40)
LDL Cholesterol: 94 mg/dL (ref 0–99)
Total CHOL/HDL Ratio: 3.9 {ratio}
Triglycerides: 104 mg/dL (ref <150)
VLDL: 21 mg/dL (ref 0–40)

## 2023-10-08 NOTE — Progress Notes (Signed)
 D: Patient is alert, oriented,preoccupied, and mostly cooperative. Denies SI, HI, AVH, and verbally contracts for safety. Patient reports she slept good last night without sleeping medication. Patient reports her appetite as fair, energy level as high, and concentration as good. Patient rates her depression 0/10, hopelessness 0/10, and anxiety 1/10. Patient denies physical symptoms/pain. Patient blood pressure is elevated. She does not want to take her PRN clonidine nor PRN hydroxyzine.    A: Scheduled medications administered per MD order. Support provided. Patient educated on safety on the unit and medications. Routine safety checks every 15 minutes. Patient stated understanding to tell nurse about any new physical symptoms. Patient understands to tell staff of any needs.     R: No adverse drug reactions noted. Patient verbally contracts for safety. Patient remains safe at this time and will continue to monitor.    10/08/23 1000  Psych Admission Type (Psych Patients Only)  Admission Status Voluntary  Psychosocial Assessment  Patient Complaints Anxiety  Eye Contact Fair  Facial Expression Animated  Affect Appropriate to circumstance  Speech Logical/coherent  Interaction Assertive  Motor Activity Restless  Appearance/Hygiene Unremarkable  Behavior Characteristics Cooperative  Mood Preoccupied  Thought Process  Coherency Circumstantial  Content Preoccupation  Delusions None reported or observed  Perception WDL  Hallucination None reported or observed  Judgment Impaired  Confusion None  Danger to Self  Current suicidal ideation? Denies  Self-Injurious Behavior No self-injurious ideation or behavior indicators observed or expressed   Agreement Not to Harm Self Yes  Description of Agreement verbal  Danger to Others  Danger to Others None reported or observed

## 2023-10-08 NOTE — [facility] Group Notes (Signed)
 The focus of this group is to help patients establish daily goals to achieve during treatment and discuss how the patient can incorporate goal setting into their daily lives to aide in recovery.      Scale 1-10 7 out of 10     Goals: To discharge
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
## 2023-10-08 NOTE — Progress Notes (Signed)
   10/08/23 2123  Psych Admission Type (Psych Patients Only)  Admission Status Voluntary  Psychosocial Assessment  Patient Complaints Worrying;Anxiety  Eye Contact Fair  Facial Expression Pained;Sad;Worried  Affect Depressed;Fearful;Sad  Speech Logical/coherent  Interaction Assertive  Motor Activity Other (Comment) (WDL)  Appearance/Hygiene Unremarkable  Behavior Characteristics Cooperative  Mood Depressed;Anxious;Sad  Thought Process  Coherency Circumstantial  Content Preoccupation  Delusions None reported or observed  Perception WDL  Hallucination None reported or observed  Judgment Impaired  Confusion None  Danger to Self  Current suicidal ideation? Denies   Patient alert and oriented. Denies SI/HI, denies pain. Spoke with pt. in her room. Patient is sad and anxious. States she is ready to be discharged and she has come a long way since being admitted. She is worried about her 14yo daughter and states she has a new job she needs to be working on. She is worried about her house. Patient started crying while speaking with [place].

## 2023-10-08 NOTE — Plan of Care (Signed)

## 2023-10-08 NOTE — [facility] Group Notes (Signed)
 [facility] Group Notes:  (Nursing/MHT/Case Psychoeducational Group Note  Date:  10/08/2023 Time:  2000  Group Topic/Focus:  Wrap up group  Participation Level: Did Not Attend  Participation Quality:  Not Applicable  Affect:  Not Applicable  Cognitive:  Not Applicable  Insight:  Not Applicable  Engagement in Group: Not Applicable  Additional Comments:  Did not attend.   Marcille Buffy 10/08/2023, 8:32 PM
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
## 2023-10-08 NOTE — Group Note (Signed)
 LCSW Group Therapy Note  Group Date: 10/08/2023 Start Time: 1100 End Time: 1200  Participation:  patient was present and actively participated in the conversation.  She was insightful.  Type of Therapy:  Group Therapy  Topic:  "Speaking from the Heart: Communicating with Understanding and Empathy"  Objective: To help participants develop effective communication skills to express themselves clearly, listen actively, and navigate conflicts in a healthy way.  Goals: Increase awareness of verbal and non-verbal communication skills. Practice using "I" statements and active listening techniques. Learn coping strategies for managing communication stress.  Summary: Participants explored the importance of communication, discussed challenges, and practiced skills such as active listening and assertive expression. They reflected on past experiences and identified ways to improve communication in their daily lives.  Therapeutic Modalities: Cognitive-Behavioral Therapy (CBT): Restructuring negative thought patterns in communication. Mindfulness: Staying present and calm during conversations. Psychoeducation: Learning about effective communication techniques.   Alla Feeling, LCSWA 10/08/2023  12:59 PM

## 2023-10-08 NOTE — Progress Notes (Signed)
 Patient refusing to have blood pressure rechecked at this current time.

## 2023-10-08 NOTE — Progress Notes (Signed)
 Patient allowed staff to recheck blood pressure and it was improved. 136/95, with a pulse of 91.

## 2023-10-09 ENCOUNTER — Encounter (HOSPITAL_COMMUNITY): Payer: Self-pay

## 2023-10-09 DIAGNOSIS — F319 Bipolar disorder, unspecified: Secondary | ICD-10-CM

## 2023-10-09 MED ORDER — AMLODIPINE BESYLATE 5 MG PO TABS
5.0000 mg | ORAL_TABLET | Freq: Every day | ORAL | Status: DC
  Filled 2023-10-09 (×5): qty 1

## 2023-10-09 NOTE — [facility] Group Notes (Signed)
 [facility] Group Notes:  (Nursing/MHT/Case Management/Adjunct)  Date:  10/09/2023  Time:  2000  Type of Therapy:   AA group  Participation Level:  Active  Participation Quality:  Appropriate and Attentive  Affect:  Appropriate  Cognitive:  Alert and Appropriate  Insight:  Appropriate and Good  Engagement in Group:  Engaged  Modes of Intervention:  Discussion  Summary of Progress/Problems:  Kelly Crosby 10/09/2023, 8:14 PM

## 2023-10-09 NOTE — Plan of Care (Signed)
   Problem: Education: Goal: Emotional status will improve Outcome: Progressing Goal: Mental status will improve Outcome: Progressing

## 2023-10-09 NOTE — Progress Notes (Signed)
   10/09/23 0543  15 Minute Checks  Location Bedroom  Visual Appearance Calm  Behavior Composed  Sleep (Behavioral Health Patients Only)  Calculate sleep? (Click Yes once per 24 hr at 0600 safety check) Yes  Documented sleep last 24 hours 4

## 2023-10-09 NOTE — Plan of Care (Signed)
 Pt presents with animated expression and affect appropriate to circumstance. Pt reports fair sleep and appetite. Rates anxiety 2/10 and depression 1/10. Denies SI, HI, AVH, and pain. Pt was ordered amlodipine for hypertension but states that "I don't like to take medication for religious reasons." Pt made aware of hypertension and states that she will try to relax before her next vitals check. Observed in the milieu throughout the day interacting with staff and peers. Pt is cooperative and pleasant. Safety checks maintained at q 15 minutes. Support, encouragement, and reassurance offered to the pt.   Problem: Education: Goal: Emotional status will improve Outcome: Progressing Goal: Mental status will improve Outcome: Progressing   Problem: Activity: Goal: Interest or engagement in activities will improve Outcome: Progressing   Problem: Safety: Goal: Periods of time without injury will increase Outcome: Progressing
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
## 2023-10-09 NOTE — BH IP Treatment Plan (Signed)
 Interdisciplinary Treatment and Diagnostic Plan Update  10/09/2023 Time of Session: 11:30 AM - UPDATE Kelly Crosby MRN: [id number]  Principal Diagnosis: Bipolar 1 disorder (HCC)  Secondary Diagnoses: Principal Problem:   Bipolar 1 disorder (HCC)   Current Medications:  Current Facility-Administered Medications  Medication Dose Route Frequency Provider Last Rate Last Admin   acetaminophen (TYLENOL) tablet 650 mg  650 mg Oral Q6H PRN Chales Abrahams, NP   650 mg at 10/09/23 1714   alum & mag hydroxide-simeth (MAALOX/MYLANTA) 200-200-20 MG/5ML suspension 30 mL  30 mL Oral Q4H PRN Chales Abrahams, NP       amLODipine (NORVASC) tablet 5 mg  5 mg Oral Daily Massengill, Nathan, MD       ARIPiprazole (ABILIFY) tablet 10 mg  10 mg Oral QHS Onuoha, Chinwendu V, NP   10 mg at 10/08/23 2123   cloNIDine (CATAPRES) tablet 0.1 mg  0.1 mg Oral Once Lauro Franklin, MD       cloNIDine (CATAPRES) tablet 0.1 mg  0.1 mg Oral Q4H PRN Massengill, Harrold Donath, MD       hydrOXYzine (ATARAX) tablet 50 mg  50 mg Oral TID PRN Chales Abrahams, NP       magnesium hydroxide (MILK OF MAGNESIA) suspension 30 mL  30 mL Oral Daily PRN Chales Abrahams, NP       OLANZapine (ZYPREXA) injection 10 mg  10 mg Intramuscular TID PRN Massengill, Harrold Donath, MD       OLANZapine (ZYPREXA) injection 5 mg  5 mg Intramuscular TID PRN Massengill, Harrold Donath, MD       OLANZapine zydis (ZYPREXA) disintegrating tablet 5 mg  5 mg Oral TID PRN Massengill, Harrold Donath, MD       ondansetron (ZOFRAN-ODT) disintegrating tablet 4 mg  4 mg Oral Q8H PRN Ophelia Shoulder E, NP   4 mg at 10/07/23 1191   traZODone (DESYREL) tablet 50 mg  50 mg Oral QHS PRN Ntuen, Jesusita Oka, FNP       vitamin D3 (CHOLECALCIFEROL) tablet 1,000 Units  1,000 Units Oral Daily Massengill, Nathan, MD   1,000 Units at 10/09/23 0801   PTA Medications: Medications Prior to Admission  Medication Sig Dispense Refill Last Dose/Taking   Multiple Vitamin (MULTIVITAMIN WITH MINERALS) TABS tablet  Take 1 tablet by mouth daily.   Past Week   Norethindrone Acetate-Ethinyl Estrad-FE (AUROVELA 24 FE) 1-20 MG-MCG(24) tablet Take 1 tablet by mouth daily.   Past Week   ondansetron (ZOFRAN-ODT) 4 MG disintegrating tablet Take 1 tablet (4 mg total) by mouth every 8 (eight) hours as needed for nausea or vomiting. 4mg  ODT q4 hours prn nausea/vomit 30 tablet 1 Past Week    Patient Stressors: Financial difficulties   Medication change or noncompliance    Patient Strengths: Average or above average intelligence  Capable of independent living  Communication skills  Motivation for treatment/growth  Supportive family/friends  Work skills   Treatment Modalities: Medication Management, Group therapy, Case management,  1 to 1 session with clinician, Psychoeducation, Recreational therapy.   Physician Treatment Plan for Primary Diagnosis: Bipolar 1 disorder (HCC) Long Term Goal(s): Improvement in symptoms so as ready for discharge   Short Term Goals: Ability to identify changes in lifestyle to reduce recurrence of condition will improve Ability to verbalize feelings will improve Ability to disclose and discuss suicidal ideas Ability to demonstrate self-control will improve Ability to identify and develop effective coping behaviors will improve Ability to maintain clinical measurements within normal limits will improve Compliance with  prescribed medications will improve Ability to identify triggers associated with substance abuse/mental health issues will improve  Medication Management: Evaluate patient's response, side effects, and tolerance of medication regimen.  Therapeutic Interventions: 1 to 1 sessions, Unit Group sessions and Medication administration.  Evaluation of Outcomes: Progressing  Physician Treatment Plan for Secondary Diagnosis: Principal Problem:   Bipolar 1 disorder (HCC)  Long Term Goal(s): Improvement in symptoms so as ready for discharge   Short Term Goals: Ability to  identify changes in lifestyle to reduce recurrence of condition will improve Ability to verbalize feelings will improve Ability to disclose and discuss suicidal ideas Ability to demonstrate self-control will improve Ability to identify and develop effective coping behaviors will improve Ability to maintain clinical measurements within normal limits will improve Compliance with prescribed medications will improve Ability to identify triggers associated with substance abuse/mental health issues will improve     Medication Management: Evaluate patient's response, side effects, and tolerance of medication regimen.  Therapeutic Interventions: 1 to 1 sessions, Unit Group sessions and Medication administration.  Evaluation of Outcomes: Progressing   RN Treatment Plan for Primary Diagnosis: Bipolar 1 disorder (HCC) Long Term Goal(s): Knowledge of disease and therapeutic regimen to maintain health will improve  Short Term Goals: Ability to remain free from injury will improve, Ability to verbalize frustration and anger appropriately will improve, Ability to verbalize feelings will improve, and Ability to disclose and discuss suicidal ideas  Medication Management: RN will administer medications as ordered by provider, will assess and evaluate patient's response and provide education to patient for prescribed medication. RN will report any adverse and/or side effects to prescribing provider.  Therapeutic Interventions: 1 on 1 counseling sessions, Psychoeducation, Medication administration, Evaluate responses to treatment, Monitor vital signs and CBGs as ordered, Perform/monitor CIWA, COWS, AIMS and Fall Risk screenings as ordered, Perform wound care treatments as ordered.  Evaluation of Outcomes: Progressing   LCSW Treatment Plan for Primary Diagnosis: Bipolar 1 disorder (HCC) Long Term Goal(s): Safe transition to appropriate next level of care at discharge, Engage patient in therapeutic group  addressing interpersonal concerns.  Short Term Goals: Engage patient in aftercare planning with referrals and resources, Increase ability to appropriately verbalize feelings, Facilitate acceptance of mental health diagnosis and concerns, and Identify triggers associated with mental health/substance abuse issues  Therapeutic Interventions: Assess for all discharge needs, 1 to 1 time with Social worker, Explore available resources and support systems, Assess for adequacy in community support network, Educate family and significant other(s) on suicide prevention, Complete Psychosocial Assessment, Interpersonal group therapy.  Evaluation of Outcomes: Progressing   Progress in Treatment: Attending groups: Yes. Participating in groups: Yes. Taking medication as prescribed: Yes. Toleration medication: Yes. Family/Significant other contact made: Yes, contacted mother [name] [phone] Patient understands diagnosis: Yes. Discussing patient identified problems/goals with staff: Yes. Medical problems stabilized or resolved: Yes. Denies suicidal/homicidal ideation: Yes. Issues/concerns per patient self-inventory: No. Other: None   New problem(s) identified: No, Describe:  None   New Short Term/Long Term Goal(s): medication stabilization, elimination of SI thoughts, development of comprehensive mental wellness plan.     Patient Goals: "Re-establish resources for better coping"   Discharge Plan or Barriers:    Reason for Continuation of Hospitalization: Medication stabilization Other; describe mood stabilization, discharge planning   Estimated Length of Stay:  2 - 4 days  Last 3 [place] Suicide Severity Risk Score: Flowsheet Row Admission (Current) [facility] from 10/03/2023 in [facility]  at [facility] ED from 05/23/2023 in [facility] Emergency Department at [facility]  C-SSRS RISK  CATEGORY No Risk No Risk No Risk       Last PHQ 2/9 Scores:    06/17/2023    3:17 PM 01/28/2023    5:27 PM 05/05/2022    3:26 PM  Depression screen PHQ 2/9  Decreased Interest 0 1 3  Down, Depressed, Hopeless 0 1 2  PHQ - 2 Score 0 2 5  Altered sleeping  3 2  Tired, decreased energy  2 3  Change in appetite  2 2  Feeling bad or failure about yourself   1 2  Trouble concentrating  1 3  Moving slowly or fidgety/restless  0 1  Suicidal thoughts  0 0  PHQ-9 Score  11 18  Difficult doing work/chores   Somewhat difficult    Scribe for Treatment Team: Alla Feeling, LCSWA 10/09/2023 8:02 PM

## 2023-10-09 NOTE — Group Note (Signed)
 Recreation Therapy Group Note   Group Topic:Problem Solving  Group Date: 10/09/2023 Start Time: 0930 End Time: 0950 Facilitators: Adrieanna Boteler-McCall, LRT,CTRS Location: [address] Dayroom   Group Topic: Communication, Team Building, Problem Solving  Goal Area(s) Addresses:  Patient will effectively work with peer towards shared goal.  Patient will identify skill used to make activity successful.  Patient will identify how skills used during activity can be used to reach post d/c goals.  Patient will identify a daily goal. Patient will cooperate in goals group conversation.   Intervention: Cup [employer] bands with attached strings enough for each group member, 10 or more cups  Activity: Patient(s) were given a set of solo cups, a rubber band, and some tied strings. The objective is to build a pyramid with the cups by only using the rubber band and string to move the cups. After the activity the patient(s) are LRT debriefed and discussed what strategies worked, what didn't, and what lessons they can take from the activity and use in life post discharge.   Education Areas: Pharmacist, community, Counsellor, Discharge Planning   Education Outcome: Acknowledges education   Affect/Mood: Appropriate   Participation Level: Engaged   Participation Quality: Independent   Behavior: Appropriate   Speech/Thought Process: Focused   Insight: Good   Judgement: Good   Modes of Intervention: Problem-solving   Patient Response to Interventions:  Engaged   Education Outcome:  In group clarification offered    Clinical Observations/Individualized Feedback: Pt was bright and upbeat during group. Pt and peers shared ideas on completing activity. Pt was appropriate during group session.     Plan: Continue to engage patient in RT group sessions 2-3x/week.   Seferina Brokaw-McCall, LRT,CTRS 10/09/2023 11:55 AM

## 2023-10-10 DIAGNOSIS — F411 Generalized anxiety disorder: Secondary | ICD-10-CM | POA: Insufficient documentation

## 2023-10-10 DIAGNOSIS — F319 Bipolar disorder, unspecified: Secondary | ICD-10-CM | POA: Diagnosis not present

## 2023-10-10 LAB — GLUCOSE, CAPILLARY: Glucose-Capillary: 101 mg/dL — ABNORMAL HIGH (ref 70–99)

## 2023-10-10 MED ORDER — NICOTINE POLACRILEX 2 MG MT GUM: 2.0000 mg | CHEWING_GUM | OROMUCOSAL | Status: DC | PRN

## 2023-10-10 MED ORDER — WHITE PETROLATUM EX OINT
TOPICAL_OINTMENT | CUTANEOUS | Status: AC
  Filled 2023-10-10: qty 5

## 2023-10-10 MED ORDER — EPINEPHRINE 0.3 MG/0.3ML IJ SOAJ: 0.3000 mg | INTRAMUSCULAR | Status: DC | PRN

## 2023-10-10 MED ORDER — DIPHENHYDRAMINE HCL 25 MG PO CAPS
ORAL_CAPSULE | ORAL | Status: AC
  Filled 2023-10-10: qty 1

## 2023-10-10 MED ORDER — DIPHENHYDRAMINE HCL 25 MG PO CAPS: 25.0000 mg | ORAL_CAPSULE | Freq: Four times a day (QID) | ORAL | Status: DC | PRN

## 2023-10-10 MED ORDER — HYDRALAZINE HCL 10 MG PO TABS
10.0000 mg | ORAL_TABLET | Freq: Two times a day (BID) | ORAL | Status: DC
  Filled 2023-10-10 (×5): qty 1

## 2023-10-10 NOTE — Progress Notes (Signed)
 Pt states that she ate an oatmeal raison cookie and began to feel flushed. Pt came to nurse's station and requested benadryl.  NP notified and new orders received.  Pt presently in dayroom interacting with peers in no apparent distress.  Pt refused BP medication, both scheduled and prn. Pt educated on risks high blood pressure presents.  Will continue to monitor.     10/10/23 2131  Psych Admission Type (Psych Patients Only)  Admission Status Voluntary  Psychosocial Assessment  Patient Complaints Anxiety  Eye Contact Fair  Facial Expression Animated  Affect Appropriate to circumstance  Speech Logical/coherent  Interaction Assertive  Motor Activity Other (Comment) (WDL)  Appearance/Hygiene Unremarkable  Behavior Characteristics Appropriate to situation  Mood Pleasant  Thought Process  Coherency WDL  Content Preoccupation  Delusions None reported or observed  Perception WDL  Hallucination None reported or observed  Judgment WDL  Confusion None  Danger to Self  Current suicidal ideation? Denies  Self-Injurious Behavior No self-injurious ideation or behavior indicators observed or expressed   Agreement Not to Harm Self Yes  Description of Agreement verbal  Danger to Others  Danger to Others None reported or observed

## 2023-10-10 NOTE — Progress Notes (Signed)
 Pt with BP 146/113.  Offered ordered PRN BP medication at 0640.  Pt refused requesting another BP check after breakfast.

## 2023-10-10 NOTE — Progress Notes (Signed)
   10/10/23 1138  Vital Signs  Pulse Rate 89  BP (!) 140/110  BP Method Automatic   Patient does not want to take PRN clonidine.

## 2023-10-10 NOTE — Progress Notes (Addendum)
   10/10/23 1612  Vital Signs  Pulse Rate 86  BP (!) 139/104  BP Method Automatic   Patient does not want to take any PRN clonidine at this time.
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
## 2023-10-10 NOTE — [facility] Group Notes (Signed)
 [facility] Group Notes:  (Nursing/MHT/Case Management/Adjunct)  Date:  10/10/2023  Time:  2000  Type of Therapy:   Wrap up group  Participation Level:  Active  Participation Quality:  Appropriate, Attentive, Sharing, and Supportive  Affect:  Appropriate  Cognitive:  Alert  Insight:  Improving  Engagement in Group:  Engaged  Modes of Intervention:  Clarification, Education, and Socialization  Summary of Progress/Problems: Positive thinking and self-care were discussed.   Kelly Crosby S 10/10/2023, 9:10 PM

## 2023-10-10 NOTE — Progress Notes (Signed)
 Pt visible in the milieu.  Interacting appropriately with staff and peers.  Attending groups.  Pt reported she was experiencing anxiety because all of her visitors didn't come.  Pt denied SI, HI and AVH. Security checks continue for patient safety.  Pt safe on unit.

## 2023-10-10 NOTE — Progress Notes (Signed)
   10/10/23 1841  Vital Signs  Pulse Rate 97  Pulse Rate Source Monitor  BP (!) 163/118  BP Method Automatic  Oxygen Therapy  SpO2 100 %   Patient refuses PRN clonidine at this time.

## 2023-10-10 NOTE — Plan of Care (Signed)

## 2023-10-10 NOTE — Group Note (Signed)
 Date:  10/10/2023 Time:  5:11 PM  Group Topic/Focus:  Goals Group:   The focus of this group is to help patients establish daily goals to achieve during treatment and discuss how the patient can incorporate goal setting into their daily lives to aide in recovery. Orientation:   The focus of this group is to educate the patient on the purpose and policies of crisis stabilization and provide a format to answer questions about their admission.  The group details unit policies and expectations of patients while admitted.    Participation Level:  Active  Participation Quality:  Attentive and Monopolizing  Affect:  Appropriate  Cognitive:  Appropriate  Insight: Appropriate  Engagement in Group:  Engaged and Monopolizing  Modes of Intervention:  Discussion and Orientation  Additional Comments:   Pt was attentive and actively participated however had to be redirected 3x for interjecting while peers disclosed. Pt personal goal for today is to relax more.   Kelly Crosby 10/10/2023, 5:11 PM

## 2023-10-10 NOTE — Group Note (Signed)
 LCSW Group Therapy Note   Group Date: 10/10/2023 Start Time: 0100 End Time: 0145   Type of Therapy and Topic:  Group Therapy:   Participation Level:  Active  Description of Group This process group involved patients discussing the situations or people in their lives that frequently make them safe or unsafe.  Anxiety was a common factor among all group participants and many of them described home situations that keep them on edge and not able to feel completely safe.  Three questions were addressed during the group:  (1) What makes you feel safe (or unsafe)?  (2) Do you feel safe with yourself and why?  (3) If you don't feel safe, what can you do?  A lengthy discussion ensued in which group members empathized with each other, gave suggestions to one another, and expressed their feelings freely.  Therapeutic Goals Patient will describe what makes them feel safe or unsafe in their everyday lives. Patient will think about and discuss whether they feel safe with themselves and what reasons might contribute to feeling safe or unsafe. Patients will participate in planning for what can be done to help themselves feel safer.   Summary of Patient Progress:  Patient share that she understand the safe and unsafe coping skills. Patient stated that she enjoy group it helps her understand what she can handle and what she can not handle. Patient share that she enjoy being with her daughter it helps her cope.     Therapeutic Modalities:   Matika Bartell O Kenia Teagarden, LCSWA 10/10/2023  4:02 PM

## 2023-10-10 NOTE — Progress Notes (Signed)
 D: Patient is alert, oriented, pleasant, needy, and cooperative. Denies SI, HI, AVH, and verbally contracts for safety. Patient reports she slept poor last night without sleeping medication. Patient reports her appetite as poor, energy level as hyper, and concentration as poor. Patient rates her depression 3/10, hopelessness 1/10, and anxiety 7/10. Patient blood pressure elevated. Patient does not want any PRN clonidine today. Patient asked for her sugar to be checked today, CBG 101. Patient has vague reports of not feeling well or having a headache. She is observed being silly with peers, laughing and playing.    A: Patient refused scheduled morning blood pressure medications. Scheduled vitamin D3 administered per MD order. Support provided. Patient educated on safety on the unit and medications. Routine safety checks every 15 minutes. Patient stated understanding to tell nurse about any new physical symptoms. Patient understands to tell staff of any needs.     R: No adverse drug reactions noted. Patient remains safe at this time and will continue to monitor.    10/10/23 1100  Psych Admission Type (Psych Patients Only)  Admission Status Voluntary  Psychosocial Assessment  Patient Complaints Anxiety  Eye Contact Fair  Facial Expression Animated  Affect Appropriate to circumstance  Speech Logical/coherent  Interaction Assertive  Motor Activity Other (Comment) (WNL)  Appearance/Hygiene Unremarkable  Behavior Characteristics Cooperative;Calm  Mood Pleasant  Thought Process  Coherency Circumstantial  Content Preoccupation  Delusions None reported or observed  Perception WDL  Hallucination None reported or observed  Judgment WDL  Confusion None  Danger to Self  Current suicidal ideation? Denies  Self-Injurious Behavior No self-injurious ideation or behavior indicators observed or expressed   Agreement Not to Harm Self Yes  Description of Agreement verbal  Danger to Others  Danger to  Others None reported or observed

## 2023-10-11 DIAGNOSIS — F319 Bipolar disorder, unspecified: Secondary | ICD-10-CM

## 2023-10-11 MED ORDER — NICOTINE 14 MG/24HR TD PT24
14.0000 mg | MEDICATED_PATCH | Freq: Every day | TRANSDERMAL | Status: DC
  Filled 2023-10-11 (×2): qty 1

## 2023-10-11 MED ORDER — ARIPIPRAZOLE 10 MG PO TABS: 10.0000 mg | ORAL_TABLET | Freq: Every day | ORAL | 0 refills | Status: DC

## 2023-10-11 MED ORDER — NICOTINE POLACRILEX 2 MG MT GUM: 2.0000 mg | CHEWING_GUM | OROMUCOSAL | 0 refills | Status: DC | PRN

## 2023-10-11 MED ORDER — VITAMIN D3 25 MCG PO TABS: 1000.0000 [IU] | ORAL_TABLET | Freq: Every day | ORAL | Status: DC

## 2023-10-11 MED ORDER — NICOTINE 14 MG/24HR TD PT24: 14.0000 mg | MEDICATED_PATCH | Freq: Every day | TRANSDERMAL | 0 refills | Status: DC

## 2023-10-11 MED ORDER — EPINEPHRINE 0.3 MG/0.3ML IJ SOAJ: 0.3000 mg | INTRAMUSCULAR | 0 refills | Status: DC | PRN

## 2023-10-11 NOTE — Discharge Instructions (Signed)
-  Follow-up with your outpatient psychiatric provider -instructions on appointment date, time, and address (location) are provided to you in discharge paperwork.  -Take your psychiatric medications as prescribed at discharge - instructions are provided to you in the discharge paperwork  -Follow-up with outpatient primary care doctor and other specialists -for management of preventative medicine and any chronic medical disease - including hypertension.   -Recommend abstinence from alcohol, tobacco, and other illicit drug use at discharge.   -If your psychiatric symptoms recur, worsen, or if you have side effects to your psychiatric medications, call your outpatient psychiatric provider, 911, [phone] or go to the nearest emergency department.  -If suicidal thoughts occur, call your outpatient psychiatric provider, 911, [phone] or go to the nearest emergency department.  Naloxone (Narcan) can help reverse an overdose when given to the victim quickly.  [facility] offers free naloxone kits and instructions/training on its use.  Add naloxone to your first aid kit and you can help save a life.   Pick up your free kit at the following locations:   [place]:  [facility] of [facility], [address] [place] [address] [phone]) [facility] [address] [place] [address] [phone]) [facility] [address] [place] [address]  [place]: [facility] of [facility] [address] [place] [address] ([phone]) [facility] [address] [place] [address] [phone])
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
## 2023-10-11 NOTE — Plan of Care (Signed)
  Problem: Education: Goal: Verbalization of understanding the information provided will improve Outcome: Progressing   Problem: Health Behavior/Discharge Planning: Goal: Compliance with treatment plan for underlying cause of condition will improve Outcome: Not Progressing

## 2023-10-11 NOTE — Progress Notes (Signed)
  [facility] Adult Case Management Discharge Plan :  Will you be returning to the same living situation after discharge:  Yes,  pts bestfriend Kelly Crosby [phone] will be staying with the patient for a few days At discharge, do you have transportation home?: Yes,  Kelly Crosby [phone]  will p/u pt at 1130 Do you have the ability to pay for your medications: Yes,  [name] / [name]  Release of information consent forms completed and in the chart;  Patient's signature needed at discharge.  Patient to Follow up at:  Follow-up Information     [place] Go on 10/14/2023.   Specialty: Professional Counselor Why: Please go to this provider for therapy services, Monday through Friday, from 9 am to 1 pm. Contact information: [address] [place] [address] [phone]         [facility]. Go on 10/20/2023.   Specialty: Behavioral Health Why: Please go to this provider for medication management services, Monday through Friday, arrive by 7:00 am. Contact information: [address] [place] [address] [phone]                Next level of care provider has access to [facility] Link:no  Safety Planning and Suicide Prevention discussed: Yes,  C - mother Kelly Crosby [phone]     Has patient been referred to the Quitline?: Yes, faxed/e-referral on 10/11/23  Patient has been referred for addiction treatment:  pt has been referred to [facility] [place]  [name], LCSWA 10/11/2023, 11:25 AM
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
## 2023-10-11 NOTE — Group Note (Unsigned)
 Date:  10/11/2023 Time:  10:29 AM  Group Topic/Focus:  Goals Group:   The focus of this group is to help patients establish daily goals to achieve during treatment and discuss how the patient can incorporate goal setting into their daily lives to aide in recovery.     Participation Level:  {BHH PARTICIPATION ZOXWR:60454}  Participation Quality:  {BHH PARTICIPATION QUALITY:22265}  Affect:  {BHH AFFECT:22266}  Cognitive:  {BHH COGNITIVE:22267}  Insight: {BHH Insight2:20797}  Engagement in Group:  {BHH ENGAGEMENT IN UJWJX:91478}  Modes of Intervention:  {BHH MODES OF INTERVENTION:22269}  Additional Comments:  ***  Estill Dooms 10/11/2023, 10:29 AM

## 2023-10-11 NOTE — Progress Notes (Signed)
 Pt discharged at this time. Pt removed all belongings, prescriptions, and verbalized understanding of medications and discharge instructions. Pt denies SI/HI/AVH and pain.

## 2023-10-11 NOTE — Group Note (Signed)
 Date:  10/11/2023 Time:  12:38 PM  Group Topic/Focus:  Goals Group:   The focus of this group is to help patients establish daily goals to achieve during treatment and discuss how the patient can incorporate goal setting into their daily lives to aide in recovery.    Participation Level:  Active  Participation Quality:  Attentive  Affect:  Appropriate  Cognitive:  Appropriate  Insight: Good  Engagement in Group:  Engaged  Modes of Intervention:  Education  Additional Comments:    Estill Dooms 10/11/2023, 12:38 PM

## 2023-10-11 NOTE — Progress Notes (Signed)
   10/11/23 0529  15 Minute Checks  Location Bedroom  Visual Appearance Calm  Behavior Composed  Sleep (Behavioral Health Patients Only)  Calculate sleep? (Click Yes once per 24 hr at 0600 safety check) Yes  Documented sleep last 24 hours 2.25

## 2023-10-14 ENCOUNTER — Encounter: Payer: Self-pay | Admitting: Family

## 2023-10-20 ENCOUNTER — Emergency Department (HOSPITAL_COMMUNITY)
Admission: EM | Admit: 2023-10-20 | Discharge: 2023-10-21 | Disposition: A | Discharge status: Other Acute Inpatient Hospital | Payer: MEDICAID

## 2023-10-20 ENCOUNTER — Encounter (HOSPITAL_COMMUNITY): Payer: Self-pay | Admitting: Emergency Medicine

## 2023-10-20 ENCOUNTER — Other Ambulatory Visit: Payer: Self-pay

## 2023-10-20 DIAGNOSIS — F4310 Post-traumatic stress disorder, unspecified: Secondary | ICD-10-CM | POA: Insufficient documentation

## 2023-10-20 DIAGNOSIS — F609 Personality disorder, unspecified: Secondary | ICD-10-CM | POA: Diagnosis not present

## 2023-10-20 DIAGNOSIS — F17210 Nicotine dependence, cigarettes, uncomplicated: Secondary | ICD-10-CM | POA: Insufficient documentation

## 2023-10-20 DIAGNOSIS — F3113 Bipolar disorder, current episode manic without psychotic features, severe: Secondary | ICD-10-CM | POA: Diagnosis not present

## 2023-10-20 DIAGNOSIS — F319 Bipolar disorder, unspecified: Secondary | ICD-10-CM | POA: Diagnosis not present

## 2023-10-20 DIAGNOSIS — F29 Unspecified psychosis not due to a substance or known physiological condition: Secondary | ICD-10-CM

## 2023-10-20 LAB — CBC
HCT: 37.2 % (ref 36.0–46.0)
Hemoglobin: 13 g/dL (ref 12.0–15.0)
MCH: 29.1 pg (ref 26.0–34.0)
MCHC: 34.9 g/dL (ref 30.0–36.0)
MCV: 83.2 fL (ref 80.0–100.0)
Platelets: 355 10*3/uL (ref 150–400)
RBC: 4.47 MIL/uL (ref 3.87–5.11)
RDW: 13.2 % (ref 11.5–15.5)
WBC: 10.5 10*3/uL (ref 4.0–10.5)
nRBC: 0 % (ref 0.0–0.2)

## 2023-10-20 LAB — COMPREHENSIVE METABOLIC PANEL
ALT: 39 U/L (ref 0–44)
AST: 27 U/L (ref 15–41)
Albumin: 3.7 g/dL (ref 3.5–5.0)
Alkaline Phosphatase: 52 U/L (ref 38–126)
Anion gap: 12 (ref 5–15)
BUN: 6 mg/dL (ref 6–20)
CO2: 19 mmol/L — ABNORMAL LOW (ref 22–32)
Calcium: 8.9 mg/dL (ref 8.9–10.3)
Chloride: 105 mmol/L (ref 98–111)
Creatinine, Ser: 1.06 mg/dL — ABNORMAL HIGH (ref 0.44–1.00)
GFR, Estimated: >60 mL/min (ref >60)
Glucose, Bld: 93 mg/dL (ref 70–99)
Potassium: 3.8 mmol/L (ref 3.5–5.1)
Sodium: 136 mmol/L (ref 135–145)
Total Bilirubin: 0.7 mg/dL (ref 0.0–1.2)
Total Protein: 6.7 g/dL (ref 6.5–8.1)

## 2023-10-20 LAB — ETHANOL: Alcohol, Ethyl (B): <10 mg/dL (ref <10)

## 2023-10-20 LAB — RAPID URINE DRUG SCREEN, HOSP PERFORMED
Amphetamines: NOT DETECTED
Barbiturates: NOT DETECTED
Benzodiazepines: NOT DETECTED
Cocaine: NOT DETECTED
Opiates: NOT DETECTED
Tetrahydrocannabinol: POSITIVE — AB

## 2023-10-20 LAB — SALICYLATE LEVEL: Salicylate Lvl: <7 mg/dL — ABNORMAL LOW (ref 7.0–30.0)

## 2023-10-20 LAB — HCG, QUANTITATIVE, PREGNANCY: hCG, Beta Chain, Quant, S: <1 m[IU]/mL (ref <5)

## 2023-10-20 LAB — ACETAMINOPHEN LEVEL: Acetaminophen (Tylenol), Serum: <10 ug/mL — ABNORMAL LOW (ref 10–30)

## 2023-10-20 MED ORDER — ARIPIPRAZOLE 10 MG PO TABS
10.0000 mg | ORAL_TABLET | Freq: Every day | ORAL | Status: DC
  Administered 2023-10-20: 10 mg via ORAL
  Filled 2023-10-20: qty 1

## 2023-10-20 MED ORDER — NICOTINE POLACRILEX 2 MG MT GUM: 2.0000 mg | CHEWING_GUM | OROMUCOSAL | Status: DC | PRN

## 2023-10-20 MED ORDER — VITAMIN D 25 MCG (1000 UNIT) PO TABS
1000.0000 [IU] | ORAL_TABLET | Freq: Every day | ORAL | Status: DC
  Administered 2023-10-21: 1000 [IU] via ORAL
  Filled 2023-10-20: qty 1

## 2023-10-20 MED ORDER — NORETHIN ACE-ETH ESTRAD-FE 1-20 MG-MCG(24) PO TABS: 1.0000 | ORAL_TABLET | Freq: Every day | ORAL | Status: DC

## 2023-10-20 MED ORDER — NICOTINE 14 MG/24HR TD PT24: 14.0000 mg | MEDICATED_PATCH | Freq: Every day | TRANSDERMAL | Status: DC

## 2023-10-20 NOTE — ED Triage Notes (Signed)
 Pt BIB [facility] under IVC. Pt was IVC'd by mom, per pt she is supposed to be taking Abilify and believes her medications were delivered today but has not been able to take them. During triage process pt stops talking and begins signing, states she now needs an ASL interpreter.

## 2023-10-20 NOTE — ED Notes (Addendum)
 Pt is dressed out into purple scrubs. Pt belongings have been itemized and are in purple locker #6. Pt's BH folder is with primary RN.

## 2023-10-20 NOTE — BH Assessment (Signed)
 TTS Consult to be completed by IRIS. IRIS Coordinator will communicate in established secure chat assessment time and provider name. Thanks

## 2023-10-20 NOTE — Congregational Nurse Program (Signed)
 Client to RN office for help getting Abilify medication she has a paper from [facility] that says she is approved for refill. RN called pharmacy to see if they could request medication for client from provider. Since it prescribed in hospital they are not sure if it can be filled and she might have to go to PCP to get new prescription.

## 2023-10-20 NOTE — ED Notes (Signed)
 IVC: ENVELOPE: [id number] CASE: ORIGINAL PLACED IN RED FOLDER  3 COPIES ON CLIPBOARD IN PURPLE COPY IN MEDICAL RECORDS

## 2023-10-20 NOTE — ED Provider Notes (Signed)
 [place] [facility] AT [facility] Provider Note   CSN: [id number] Arrival date & time: 10/20/23  2150     History  Chief Complaint  Patient presents with   Psychiatric Evaluation    Athene Schuhmacher Thornhill is a 33 y.o. female.  Patient presents to the emerged department with law enforcement under involuntary commitment.  Patient was placed under involuntary commitment by her mother who reports that the patient has not taken her Abilify.  She reports that the patient threatened to kill herself earlier today.  She also reports the patient has been seeing spiders and believes that someone is in her house was not there.  She was seen taking in her yard and landing on top of trash cans.  [place].  She has reportedly not been sleeping for a few days and has not been eating.  During my assessment patient denies SI, hallucinations, homicidal ideations.  She states that she was released at the beginning of the month from the behavioral health hospital and is happy because she got new eyewear from her eye care doctor.  She voices no complaints at this time.  Past medical history significant for schizoaffective disorder, anxiety, obesity, personality disorder, suicidal ideations, bipolar 1 disorder  HPI     Home Medications Prior to Admission medications   Medication Sig Start Date End Date Taking? Authorizing Provider  ARIPiprazole (ABILIFY) 10 MG tablet Take 1 tablet (10 mg total) by mouth at bedtime. 10/11/23 11/10/23  Massengill, Harrold Donath, MD  EPINEPHrine 0.3 mg/0.3 mL IJ SOAJ injection Inject 0.3 mg into the muscle as needed (anaphylactic reaction). 10/11/23   Massengill, Harrold Donath, MD  Multiple Vitamin (MULTIVITAMIN WITH MINERALS) TABS tablet Take 1 tablet by mouth daily.    [provider]  nicotine (NICODERM CQ - DOSED IN MG/24 HOURS) 14 mg/24hr patch Place 1 patch (14 mg total) onto the skin daily. 10/12/23   Massengill, Harrold Donath, MD   nicotine polacrilex (NICORETTE) 2 MG gum Take 1 each (2 mg total) by mouth as needed for smoking cessation. 10/11/23   Massengill, Harrold Donath, MD  Norethindrone Acetate-Ethinyl Estrad-FE (AUROVELA 24 FE) 1-20 MG-MCG(24) tablet Take 1 tablet by mouth daily.    [provider]  vitamin D3 (CHOLECALCIFEROL) 25 MCG tablet Take 1 tablet (1,000 Units total) by mouth daily. 10/12/23   Massengill, Harrold Donath, MD      Allergies    Oat, Amoxicillin, Mustard, and Other    Review of Systems   Review of Systems  Physical Exam Updated Vital Signs BP (!) 140/92 (BP Location: Right Arm)   Pulse (!) 102   Temp 98.2 F (36.8 C) (Oral)   Resp 18   Ht 5\' 6"  (1.676 m)   Wt 117.9 kg   SpO2 100%   BMI 41.95 kg/m  Physical Exam Vitals and nursing note reviewed.  HENT:     Head: Normocephalic and atraumatic.  Eyes:     Pupils: Pupils are equal, round, and reactive to light.  Pulmonary:     Effort: Pulmonary effort is normal. No respiratory distress.  Musculoskeletal:        General: No signs of injury.     Cervical back: Normal range of motion.  Skin:    General: Skin is dry.  Neurological:     Mental Status: She is alert.  Psychiatric:        Speech: Speech normal.     Comments: Patient appears somewhat manic.  ED Results / Procedures / Treatments   Labs (all labs ordered are listed, but only abnormal results are displayed) Labs Reviewed  COMPREHENSIVE METABOLIC PANEL - Abnormal; Notable for the following components:      Result Value   CO2 19 (*)    Creatinine, Ser 1.06 (*)    All other components within normal limits  SALICYLATE LEVEL - Abnormal; Notable for the following components:   Salicylate Lvl <7.0 (*)    All other components within normal limits  ACETAMINOPHEN LEVEL - Abnormal; Notable for the following components:   Acetaminophen (Tylenol), Serum <10 (*)    All other components within normal limits  RAPID URINE DRUG SCREEN, HOSP PERFORMED - Abnormal; Notable for the  following components:   Tetrahydrocannabinol POSITIVE (*)    All other components within normal limits  ETHANOL  CBC  HCG, QUANTITATIVE, PREGNANCY    EKG None  Radiology No results found.  Procedures Procedures    Medications Ordered in ED Medications  ARIPiprazole (ABILIFY) tablet 10 mg (has no administration in time range)  cholecalciferol (VITAMIN D3) 25 MCG (1000 UNIT) tablet 1,000 Units (has no administration in time range)  nicotine polacrilex (NICORETTE) gum 2 mg (has no administration in time range)  nicotine (NICODERM CQ - dosed in mg/24 hours) patch 14 mg (has no administration in time range)    ED Course/ Medical Decision Making/ A&P                                 Medical Decision Making Amount and/or Complexity of Data Reviewed Labs: ordered.  Risk OTC drugs. Prescription drug management.   This patient presents to the ED for concern of IVC   Co morbidities that complicate the patient evaluation  Schizoaffective disorder, PTSD, bipolar 1 disorder, suicidal ideations, generalized anxiety disorder   Additional history obtained:  Additional history obtained from patient's mother   Lab Tests:  I Ordered, and personally interpreted labs.  The pertinent results include:  UDS positive for THC, unremarkable CBC,    Consultations Obtained:  I requested consultation with TTS for psychiatric evaluation   Problem List / ED Course / Critical interventions / Medication management   I ordered medication including patient's home medications    Social Determinants of Health:  Patient is a daily tobacco user, has reported difficulty paying for housing   Test / Admission - Considered:  Patient medically cleared at this time for TTS evaluation.  Patient is here under involuntary commitment.  Home meds and diet ordered.         Final Clinical Impression(s) / ED Diagnoses Final diagnoses:  Psychosis, unspecified psychosis type [facility])    Rx  / DC Orders ED Discharge Orders     None         Pamala Duffel 10/20/23 2306    Coral Spikes, DO 10/20/23 2354

## 2023-10-21 ENCOUNTER — Encounter (HOSPITAL_COMMUNITY): Payer: Self-pay | Admitting: Psychiatry

## 2023-10-21 DIAGNOSIS — F4310 Post-traumatic stress disorder, unspecified: Secondary | ICD-10-CM

## 2023-10-21 DIAGNOSIS — F3113 Bipolar disorder, current episode manic without psychotic features, severe: Secondary | ICD-10-CM

## 2023-10-21 DIAGNOSIS — F609 Personality disorder, unspecified: Secondary | ICD-10-CM

## 2023-10-21 LAB — CBG MONITORING, ED: Glucose-Capillary: 104 mg/dL — ABNORMAL HIGH (ref 70–99)

## 2023-10-21 MED ORDER — HYDROXYZINE HCL 25 MG PO TABS
25.0000 mg | ORAL_TABLET | Freq: Once | ORAL | Status: DC
  Filled 2023-10-21: qty 1

## 2023-10-21 MED ORDER — ONDANSETRON 4 MG PO TBDP
4.0000 mg | ORAL_TABLET | Freq: Three times a day (TID) | ORAL | Status: DC | PRN
Start: 2023-10-21 — End: 2023-10-21
  Administered 2023-10-21: 4 mg via ORAL
  Filled 2023-10-21: qty 1

## 2023-10-21 NOTE — Consult Note (Addendum)
  Patient seen by [facility] psychiatry, Jacquiline Doe NP, and recommended for inpatient psychiatric treatment due to acute mania.   I agree with NP recommendations, and based off IVC and assessment of patient she does appear to be in acute mania and will require inpatient treatment. Patient should remain under IVC. Her home medication of Abilify 10 mg daily has been restarted. Will have [facility] review, if no current availability will have CSW fax out.

## 2023-10-21 NOTE — ED Notes (Signed)
 Patient was given a cup of def. coffee, with cream and sugar.
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
## 2023-10-21 NOTE — Progress Notes (Signed)
 LCSW Progress Note  [id number]   Kelly Crosby  10/21/2023  10:40 AM  Description:   Inpatient Psychiatric Referral  Patient was recommended inpatient per Eligha Bridegroom NP There are no available beds at [facility], per [facility] RN. Patient was referred to the following out of network facilities:   Destination  Service Provider Address Phone Fax  [facility] [address] [place] [address] [phone]  [facility] [address] [place] [address] [phone]  [facility] [address] [place], [place] [address] [phone]  [facility] [address] [place] [address] [phone]  [facility] [address] [place] [address] [phone]  [facility] [address], [place] [address] [phone]  [facility] [address] [place] [address] [phone]  [facility] EFAX [address] Karolee Ohs [place] [phone]  [facility] [address] [place] [address] [phone]  [facility] [address] [place] [address] [phone]      Situation ongoing, CSW to continue following and update chart as more information becomes available.      [name], MSW, LCSW  10/21/2023 10:40 AM

## 2023-10-21 NOTE — ED Notes (Signed)
 Patient was given graham crackers, peanut butter and apple juice.

## 2023-10-21 NOTE — ED Notes (Signed)
 IVC'd 10/20/23; exp 10/27/23; docs in [place]

## 2023-10-21 NOTE — Progress Notes (Signed)
 Pt has been accepted to [facility] TODAY 10/21/2023 Bed assignment: Main campus  Pt meets inpatient criteria per: Eligha Bridegroom NP   Attending Physician will be Loni Beckwith, MD  Report can be called to: [phone] (this is a pager, please leave call-back number when giving report)  Pt can arrive after 8 AM  Care Team Notified: Eligha Bridegroom NP,  Rozell Searing RN  [place] Dalma Panchal LCSW-A   10/21/2023 10:54 AM

## 2023-10-21 NOTE — ED Notes (Signed)
Patient having TTS at this time. 

## 2023-10-21 NOTE — ED Notes (Signed)
 Patient requesting to speak with this RN. RN updated patient on plan of care and reason for visit. Patient states "I have not been taking my medication because I might be pregnant and it will not show up on HCG because its too early". Patient then stated "I actually cannot afford it. The medication is $8 and I only made $7 in the week I have been working. I am usually in [place] and that is a lot there". Patient given graham crackers, peanut butter and apple juice.

## 2023-10-21 NOTE — ED Provider Notes (Signed)
 Emergency Medicine Observation Re-evaluation Note  Kelly Crosby is a 33 y.o. female, seen on rounds today.  Pt initially presented to the ED for complaints of Psychiatric Evaluation Currently, the patient is resting.  Physical Exam  BP 111/84 (BP Location: Left Arm)   Pulse 93   Temp 98.2 F (36.8 C) (Oral)   Resp 16   Ht 5\' 6"  (1.676 m)   Wt 117.9 kg   SpO2 100%   BMI 41.95 kg/m  Physical Exam General: NAD   ED Course / MDM  EKG:   I have reviewed the labs performed to date as well as medications administered while in observation.  Recent changes in the last 24 hours include no acute events reported.  Plan  Current plan is for TTS / psych placement.  From psych:  Recommendations: Medication recommendations: Abilify 10 mg daily for Mania, Seroquel 50 mg- 100 mg PRN at HS if patient is not sleeping.This is an antipsychotic that can be used as a mood stabilizer in bipolar but biggest side effect is sleepiness, lack of sleep is a big detriment to bipolar mania. PRN Agitation use Haldol 5 mg PO/IM q 4 hours prn agitation, with Benadryl 50 mg PO/IM and Ativan 2 mg PO/IM for severe agitation Non-Medication/therapeutic recommendations:   Is inpatient psychiatric hospitalization recommended for this patient? Yes (Explain why): Patient is manic and is not rational and is not making good decisions Follow-Up Telepsychiatry C/L services: We will continue to follow this patient with you until stabilized or discharged.  If you have any questions or concerns, please call our TeleCare Coordination service at  [phone] and ask for myself or the provider on-call. Communication: Treatment team members (and family members if applicable) who were involved in treatment/care discussions and planning, and with whom we spoke or engaged with via secure text/chat, include the following: Treatment team via [address]   Wynetta Fines, MD 10/21/23 [phone]

## 2023-10-26 ENCOUNTER — Institutional Professional Consult (permissible substitution): Payer: MEDICAID | Admitting: Pulmonary Disease

## 2023-10-26 ENCOUNTER — Encounter: Payer: Self-pay | Admitting: Pulmonary Disease

## 2023-11-06 ENCOUNTER — Encounter (HOSPITAL_COMMUNITY): Payer: Self-pay

## 2023-11-06 ENCOUNTER — Emergency Department (HOSPITAL_COMMUNITY): Payer: MEDICAID

## 2023-11-06 ENCOUNTER — Other Ambulatory Visit: Payer: Self-pay

## 2023-11-06 ENCOUNTER — Emergency Department (HOSPITAL_COMMUNITY)
Admission: EM | Admit: 2023-11-06 | Discharge: 2023-11-06 | Disposition: A | Discharge status: Home / Self Care | Payer: MEDICAID | Attending: Emergency Medicine | Admitting: Emergency Medicine

## 2023-11-06 DIAGNOSIS — S90852A Superficial foreign body, left foot, initial encounter: Secondary | ICD-10-CM | POA: Diagnosis not present

## 2023-11-06 DIAGNOSIS — Z91040 Latex allergy status: Secondary | ICD-10-CM | POA: Diagnosis not present

## 2023-11-06 DIAGNOSIS — M79672 Pain in left foot: Secondary | ICD-10-CM | POA: Diagnosis present

## 2023-11-06 DIAGNOSIS — W25XXXA Contact with sharp glass, initial encounter: Secondary | ICD-10-CM | POA: Diagnosis not present

## 2023-11-06 MED ORDER — ACETAMINOPHEN 325 MG PO TABS
650.0000 mg | ORAL_TABLET | Freq: Once | ORAL | Status: AC
  Administered 2023-11-06: 650 mg via ORAL
  Filled 2023-11-06: qty 2

## 2023-11-06 NOTE — ED Triage Notes (Addendum)
 Pt BIB [facility] [place]. Pt reports she might stepped on glass. L foot. EMS no reports no blood on the pt's foot, unsure if it broke skin. Pt c/o 10/10 pain in her left foot.  18R 140/84 BP 98%  93P
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
## 2023-11-06 NOTE — Discharge Instructions (Signed)
 There does appear to be a very small piece of glass in your foot on your x-ray.  I was unable to visualize this to remove it in the emergency department.  This may work itself out over time.  If it continues to be painful you may call podiatry to see if they are able to assist you.  If you develop any life-threatening symptoms please return to the emergency department.

## 2023-11-06 NOTE — ED Notes (Signed)
 Pt continuously moving during discharge vitals and trying to take off vital signs equipment.
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
## 2023-11-15 ENCOUNTER — Other Ambulatory Visit: Payer: Self-pay

## 2023-11-15 ENCOUNTER — Emergency Department (HOSPITAL_COMMUNITY)
Admission: EM | Admit: 2023-11-15 | Discharge: 2023-11-15 | Disposition: A | Discharge status: Home / Self Care | Payer: MEDICAID | Attending: Emergency Medicine | Admitting: Emergency Medicine

## 2023-11-15 DIAGNOSIS — R109 Unspecified abdominal pain: Secondary | ICD-10-CM | POA: Diagnosis present

## 2023-11-15 DIAGNOSIS — Z91040 Latex allergy status: Secondary | ICD-10-CM | POA: Insufficient documentation

## 2023-11-15 LAB — POC URINE PREG, ED: Preg Test, Ur: NEGATIVE

## 2023-11-15 LAB — CBC WITH DIFFERENTIAL/PLATELET
Abs Immature Granulocytes: 0.05 10*3/uL (ref 0.00–0.07)
Basophils Absolute: 0.1 10*3/uL (ref 0.0–0.1)
Basophils Relative: 1 %
Eosinophils Absolute: 0.1 10*3/uL (ref 0.0–0.5)
Eosinophils Relative: 1 %
HCT: 36.7 % (ref 36.0–46.0)
Hemoglobin: 12.8 g/dL (ref 12.0–15.0)
Immature Granulocytes: 1 %
Lymphocytes Relative: 32 %
Lymphs Abs: 3.2 10*3/uL (ref 0.7–4.0)
MCH: 28.9 pg (ref 26.0–34.0)
MCHC: 34.9 g/dL (ref 30.0–36.0)
MCV: 82.8 fL (ref 80.0–100.0)
Monocytes Absolute: 0.6 10*3/uL (ref 0.1–1.0)
Monocytes Relative: 6 %
Neutro Abs: 6 10*3/uL (ref 1.7–7.7)
Neutrophils Relative %: 59 %
Platelets: 325 10*3/uL (ref 150–400)
RBC: 4.43 MIL/uL (ref 3.87–5.11)
RDW: 13.4 % (ref 11.5–15.5)
WBC: 10.1 10*3/uL (ref 4.0–10.5)
nRBC: 0 % (ref 0.0–0.2)

## 2023-11-15 LAB — URINALYSIS, ROUTINE W REFLEX MICROSCOPIC
Bilirubin Urine: NEGATIVE
Glucose, UA: NEGATIVE mg/dL
Hgb urine dipstick: NEGATIVE
Ketones, ur: 5 mg/dL — AB
Leukocytes,Ua: NEGATIVE
Nitrite: NEGATIVE
Protein, ur: NEGATIVE mg/dL
Specific Gravity, Urine: 1.03 (ref 1.005–1.030)
pH: 5 (ref 5.0–8.0)

## 2023-11-15 LAB — COMPREHENSIVE METABOLIC PANEL WITH GFR
ALT: 37 U/L (ref 0–44)
AST: 25 U/L (ref 15–41)
Albumin: 3.7 g/dL (ref 3.5–5.0)
Alkaline Phosphatase: 52 U/L (ref 38–126)
Anion gap: 7 (ref 5–15)
BUN: 7 mg/dL (ref 6–20)
CO2: 24 mmol/L (ref 22–32)
Calcium: 8.9 mg/dL (ref 8.9–10.3)
Chloride: 108 mmol/L (ref 98–111)
Creatinine, Ser: 0.84 mg/dL (ref 0.44–1.00)
GFR, Estimated: >60 mL/min (ref >60)
Glucose, Bld: 108 mg/dL — ABNORMAL HIGH (ref 70–99)
Potassium: 3.5 mmol/L (ref 3.5–5.1)
Sodium: 139 mmol/L (ref 135–145)
Total Bilirubin: 0.8 mg/dL (ref 0.0–1.2)
Total Protein: 6.6 g/dL (ref 6.5–8.1)

## 2023-11-15 LAB — LIPASE, BLOOD: Lipase: 30 U/L (ref 11–51)

## 2023-11-15 MED ORDER — ACETAMINOPHEN 325 MG PO TABS
650.0000 mg | ORAL_TABLET | Freq: Once | ORAL | Status: AC
  Administered 2023-11-15: 650 mg via ORAL
  Filled 2023-11-15: qty 2

## 2023-11-15 MED ORDER — KETOROLAC TROMETHAMINE 15 MG/ML IJ SOLN: 15.0000 mg | Freq: Once | INTRAMUSCULAR | Status: DC

## 2023-11-15 NOTE — ED Provider Notes (Signed)
 [place] EMERGENCY DEPARTMENT AT [facility] Provider Note   CSN: [id number] Arrival date & time: 11/15/23  0235     History  Chief Complaint  Patient presents with   Abdominal Pain   Possible Pregnancy    Kelly Crosby is a 33 y.o. female with medical history having for A-fib, anxiety, depression, Diskets ataxia, schizoaffective disorder, cannabis use disorder.  Patient presents to ED for evaluation of abdominal cramping, possible pregnancy.  Reports abdominal pain began tonight.  States abdominal pain is not on localized, all over her abdomen.  States that it feels like a cramp.  She denies any nausea, vomiting, fevers, dysuria, flank pain, diarrhea.  Reports last bowel movement was today.  States that she feels as if she is getting over a UTI because her urine has been darker than normal and she denies any overt dysuria.  Reports LMP was at the beginning of March.  Denies any vaginal discharge.   Abdominal Pain Associated symptoms: no diarrhea, no dysuria, no fever, no nausea and no vomiting   Possible Pregnancy Associated symptoms include abdominal pain.       Home Medications Prior to Admission medications   Medication Sig Start Date End Date Taking? Authorizing Provider  albuterol (VENTOLIN HFA) 108 (90 Base) MCG/ACT inhaler Inhale 2 puffs into the lungs every 6 (six) hours as needed for wheezing or shortness of breath.    [provider]  ARIPiprazole (ABILIFY) 10 MG tablet Take 1 tablet (10 mg total) by mouth at bedtime. Patient not taking: Reported on 10/21/2023 10/11/23 11/10/23  Phineas Inches, MD  EPINEPHrine 0.3 mg/0.3 mL IJ SOAJ injection Inject 0.3 mg into the muscle as needed (anaphylactic reaction). 10/11/23   Massengill, Harrold Donath, MD  Multiple Vitamin (MULTIVITAMIN WITH MINERALS) TABS tablet Take 1 tablet by mouth daily.    [provider]  nicotine (NICODERM CQ - DOSED IN MG/24 HOURS) 14 mg/24hr patch Place 1 patch (14 mg total) onto the  skin daily. Patient not taking: Reported on 10/21/2023 10/12/23   Phineas Inches, MD  nicotine polacrilex (NICORETTE) 2 MG gum Take 1 each (2 mg total) by mouth as needed for smoking cessation. Patient not taking: Reported on 10/21/2023 10/11/23   Phineas Inches, MD  Norethindrone Acetate-Ethinyl Estrad-FE (AUROVELA 24 FE) 1-20 MG-MCG(24) tablet Take 1 tablet by mouth daily. Patient not taking: Reported on 10/21/2023    [provider]  vitamin D3 (CHOLECALCIFEROL) 25 MCG tablet Take 1 tablet (1,000 Units total) by mouth daily. Patient not taking: Reported on 10/21/2023 10/12/23   Phineas Inches, MD      Allergies    Mustard, Oat, Latex, Amoxicillin, and Other    Review of Systems   Review of Systems  Constitutional:  Negative for fever.  Gastrointestinal:  Positive for abdominal pain. Negative for diarrhea, nausea and vomiting.  Genitourinary:  Negative for dysuria.  All other systems reviewed and are negative.   Physical Exam Updated Vital Signs BP 117/69 (BP Location: Right Arm)   Pulse 90   Temp 98 F (36.7 C) (Oral)   Resp 16   SpO2 98%  Physical Exam Vitals and nursing note reviewed.  Constitutional:      General: She is not in acute distress.    Appearance: She is well-developed.  HENT:     Head: Normocephalic and atraumatic.  Eyes:     Conjunctiva/sclera: Conjunctivae normal.  Cardiovascular:     Rate and Rhythm: Normal rate and regular rhythm.     Heart  sounds: No murmur heard. Pulmonary:     Effort: Pulmonary effort is normal. No respiratory distress.     Breath sounds: Normal breath sounds.  Abdominal:     Palpations: Abdomen is soft.     Tenderness: There is no abdominal tenderness.     Comments: No tenderness to patient abdomen.  No overlying skin change, rebound, guarding.  No CVA tenderness bilaterally.  Musculoskeletal:        General: No swelling.     Cervical back: Neck supple.  Skin:    General: Skin is warm and dry.     Capillary  Refill: Capillary refill takes less than 2 seconds.  Neurological:     Mental Status: She is alert.  Psychiatric:        Mood and Affect: Mood normal.     ED Results / Procedures / Treatments   Labs (all labs ordered are listed, but only abnormal results are displayed) Labs Reviewed  COMPREHENSIVE METABOLIC PANEL WITH GFR - Abnormal; Notable for the following components:      Result Value   Glucose, Bld 108 (*)    All other components within normal limits  URINALYSIS, ROUTINE W REFLEX MICROSCOPIC - Abnormal; Notable for the following components:   APPearance HAZY (*)    Ketones, ur 5 (*)    All other components within normal limits  CBC WITH DIFFERENTIAL/PLATELET  LIPASE, BLOOD  POC URINE PREG, ED    EKG None  Radiology No results found.  Procedures Procedures   Medications Ordered in ED Medications  acetaminophen (TYLENOL) tablet 650 mg (650 mg Oral Given 11/15/23 0346)    ED Course/ Medical Decision Making/ A&P  Medical Decision Making Amount and/or Complexity of Data Reviewed Labs: ordered.   33 year old female presents for evaluation.  Please see HPI for further details.  On exam patient is afebrile and nontachycardic.  Her lung sounds are clear bilaterally, she is not hypoxic.  Her abdomen is soft compressible without tenderness noted.  No CVA tenderness bilaterally.  No overlying skin change, rebound, guarding.  Neurological examinations at baseline without focal neurodeficits.  Overall nontoxic in appearance with reassuring vital signs.  Patient CBC without leukocytosis or anemia.  Metabolic panel without electrolyte derangement.  Analysis without nitrites, leukocytes, does show ketones.  Lipase 30.  Pregnancy test negative.  Patient given Tylenol for pain.  Patient reports pain is decreased at this time.  Patient has eaten 2 sandwiches.  Patient abdominal pain could be secondary to menstrual cramps.  Will have patient follow-up with PCP.  I encouraged her to  take Tylenol or ibuprofen at home for pain.  She voiced understanding.  She is able to discharge.  Final Clinical Impression(s) / ED Diagnoses Final diagnoses:  Abdominal cramping    Rx / DC Orders ED Discharge Orders     None         Al Decant, PA-C 11/15/23 0516    Loetta Rough, MD 11/15/23 [phone]

## 2023-11-15 NOTE — ED Triage Notes (Signed)
 [facility] reporting lower abdominal cramping, concern she may be pregnant but has not taken a test. Requesting pregnancy test. Denies bleeding. LMP 2/28. A&Ox4, GCS 15.

## 2023-11-15 NOTE — Discharge Instructions (Addendum)
 It was a pleasure taking part in your care.  As we discussed, your workup is reassuring.  Please follow-up with your PCP.  Please take ibuprofen for pain.  Please return to the ED with any new or worsening symptoms.

## 2023-11-21 ENCOUNTER — Other Ambulatory Visit: Payer: Self-pay | Admitting: Obstetrics and Gynecology

## 2023-11-21 ENCOUNTER — Other Ambulatory Visit: Payer: Self-pay | Admitting: Family

## 2023-11-21 DIAGNOSIS — R110 Nausea: Secondary | ICD-10-CM

## 2023-11-22 ENCOUNTER — Emergency Department (HOSPITAL_COMMUNITY): Payer: MEDICAID

## 2023-11-22 ENCOUNTER — Encounter (HOSPITAL_COMMUNITY): Payer: Self-pay

## 2023-11-22 ENCOUNTER — Emergency Department (HOSPITAL_COMMUNITY)
Admission: EM | Admit: 2023-11-22 | Discharge: 2023-11-22 | Disposition: A | Discharge status: Home / Self Care | Payer: MEDICAID | Attending: Emergency Medicine | Admitting: Emergency Medicine

## 2023-11-22 ENCOUNTER — Other Ambulatory Visit: Payer: Self-pay

## 2023-11-22 ENCOUNTER — Ambulatory Visit (HOSPITAL_COMMUNITY)
Admission: EM | Admit: 2023-11-22 | Discharge: 2023-11-22 | Disposition: A | Discharge status: Home / Self Care | Source: Ambulatory Visit | Attending: Emergency Medicine | Admitting: Emergency Medicine

## 2023-11-22 DIAGNOSIS — M546 Pain in thoracic spine: Secondary | ICD-10-CM | POA: Diagnosis not present

## 2023-11-22 DIAGNOSIS — T7421XA Adult sexual abuse, confirmed, initial encounter: Secondary | ICD-10-CM | POA: Diagnosis present

## 2023-11-22 DIAGNOSIS — Z0441 Encounter for examination and observation following alleged adult rape: Secondary | ICD-10-CM | POA: Insufficient documentation

## 2023-11-22 DIAGNOSIS — R0781 Pleurodynia: Secondary | ICD-10-CM | POA: Diagnosis not present

## 2023-11-22 DIAGNOSIS — M25552 Pain in left hip: Secondary | ICD-10-CM | POA: Insufficient documentation

## 2023-11-22 DIAGNOSIS — M79621 Pain in right upper arm: Secondary | ICD-10-CM | POA: Diagnosis not present

## 2023-11-22 LAB — HCG, SERUM, QUALITATIVE: Preg, Serum: NEGATIVE

## 2023-11-22 LAB — COMPREHENSIVE METABOLIC PANEL WITH GFR
ALT: 33 U/L (ref 0–44)
AST: 20 U/L (ref 15–41)
Albumin: 3.9 g/dL (ref 3.5–5.0)
Alkaline Phosphatase: 50 U/L (ref 38–126)
Anion gap: 6 (ref 5–15)
BUN: 15 mg/dL (ref 6–20)
CO2: 25 mmol/L (ref 22–32)
Calcium: 8.9 mg/dL (ref 8.9–10.3)
Chloride: 104 mmol/L (ref 98–111)
Creatinine, Ser: 0.8 mg/dL (ref 0.44–1.00)
GFR, Estimated: >60 mL/min (ref >60)
Glucose, Bld: 88 mg/dL (ref 70–99)
Potassium: 3.7 mmol/L (ref 3.5–5.1)
Sodium: 135 mmol/L (ref 135–145)
Total Bilirubin: 0.8 mg/dL (ref 0.0–1.2)
Total Protein: 6.9 g/dL (ref 6.5–8.1)

## 2023-11-22 LAB — RAPID HIV SCREEN (HIV 1/2 AB+AG)
HIV 1/2 Antibodies: NONREACTIVE
HIV-1 P24 Antigen - HIV24: NONREACTIVE

## 2023-11-22 LAB — HEPATITIS B SURFACE ANTIGEN: Hepatitis B Surface Ag: NONREACTIVE

## 2023-11-22 LAB — HEPATITIS C ANTIBODY: HCV Ab: NONREACTIVE
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
## 2023-11-23 LAB — RPR: RPR Ser Ql: NONREACTIVE

## 2023-11-24 ENCOUNTER — Telehealth: Payer: Self-pay

## 2023-11-24 NOTE — Telephone Encounter (Signed)
 Copied from CRM [id number]. Topic: General - Other >> Nov 24, 2023 10:13 AM Everette C wrote: Reason for CRM: The patient has called to request a certification of disability document to help them receive housing. The patient would like for the document to be placed in their mychart once completed. Please contact the patient further if needed

## 2023-11-26 ENCOUNTER — Emergency Department (HOSPITAL_COMMUNITY)
Admission: EM | Admit: 2023-11-26 | Discharge: 2023-11-26 | Discharge status: Against Medical Advice | Payer: MEDICAID | Attending: Student | Admitting: Student

## 2023-11-26 ENCOUNTER — Encounter (HOSPITAL_COMMUNITY): Payer: Self-pay

## 2023-11-26 DIAGNOSIS — R519 Headache, unspecified: Secondary | ICD-10-CM | POA: Diagnosis present

## 2023-11-26 DIAGNOSIS — Z3200 Encounter for pregnancy test, result unknown: Secondary | ICD-10-CM | POA: Insufficient documentation

## 2023-11-26 DIAGNOSIS — Z5321 Procedure and treatment not carried out due to patient leaving prior to being seen by health care provider: Secondary | ICD-10-CM | POA: Insufficient documentation

## 2023-11-26 NOTE — ED Provider Triage Note (Signed)
 Emergency Medicine Provider Triage Evaluation Note  Kelly Crosby , a 33 y.o. female  was evaluated in triage.  Pt complains of headache and is requesting a pregnancy test.  Review of Systems  Positive:  Negative:   Physical Exam  BP 122/84   Pulse 81   Temp 99.1 F (37.3 C) (Oral)   Resp 16   Ht 5\' 6"  (1.676 m)   Wt 117.9 kg   SpO2 99%   BMI 41.96 kg/m  Gen:   Awake, no distress   Resp:  Normal effort  MSK:   Moves extremities without difficulty  Other:    Medical Decision Making  Medically screening exam initiated at 3:51 AM.  Appropriate orders placed.  Doloras S Dhaliwal was informed that the remainder of the evaluation will be completed by another provider, this initial triage assessment does not replace that evaluation, and the importance of remaining in the ED until their evaluation is complete.     Elisa Guest, [place] 11/26/23 [phone]
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
## 2023-11-26 NOTE — ED Notes (Signed)
Pt was called x2 No answer 

## 2023-12-01 NOTE — Telephone Encounter (Signed)
 Dr. Elvan Hamel, please advise regarding patient's request for certification of disability document. Thank you.

## 2023-12-04 ENCOUNTER — Emergency Department (HOSPITAL_COMMUNITY)
Admission: EM | Admit: 2023-12-04 | Discharge: 2023-12-04 | Discharge status: Against Medical Advice | Payer: MEDICAID | Attending: Emergency Medicine | Admitting: Emergency Medicine

## 2023-12-04 ENCOUNTER — Encounter (HOSPITAL_COMMUNITY): Payer: Self-pay | Admitting: Emergency Medicine

## 2023-12-04 DIAGNOSIS — R519 Headache, unspecified: Secondary | ICD-10-CM | POA: Insufficient documentation

## 2023-12-04 DIAGNOSIS — Z5321 Procedure and treatment not carried out due to patient leaving prior to being seen by health care provider: Secondary | ICD-10-CM | POA: Diagnosis not present

## 2023-12-04 DIAGNOSIS — R220 Localized swelling, mass and lump, head: Secondary | ICD-10-CM | POA: Diagnosis present

## 2023-12-04 NOTE — ED Notes (Signed)
 PA assessed
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
## 2023-12-04 NOTE — ED Notes (Addendum)
 Pt leaving the ER AMA. States she will go to her obgyn
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
## 2023-12-09 NOTE — Congregational Nurse Program (Signed)
 Client requesting help with resources and finding out [employer] number and number provided for her to call. RN also gave her a reminder of her upcoming appointments. Client also requesting information on a certificate of disability, RN updated her that RN did not see a letter in [facility] and when Dr. Elvan Hamel creates it they will place it in [facility] for her to view. Client says she is pregnant with a baby boy and due in August, she says she is going to Dr. Racheal Buddle' office for a walk in appointment. She is requesting bus passes home but RN unable to give passes due to no medical necessity. RN will continue to follow as necessary.

## 2023-12-16 ENCOUNTER — Encounter (HOSPITAL_COMMUNITY): Payer: Self-pay

## 2023-12-18 ENCOUNTER — Emergency Department (HOSPITAL_COMMUNITY)
Admission: EM | Admit: 2023-12-18 | Discharge: 2023-12-18 | Discharge status: Against Medical Advice | Payer: MEDICAID | Attending: Student | Admitting: Student

## 2023-12-18 ENCOUNTER — Other Ambulatory Visit: Payer: Self-pay

## 2023-12-18 ENCOUNTER — Encounter (HOSPITAL_COMMUNITY): Payer: Self-pay | Admitting: Emergency Medicine

## 2023-12-18 DIAGNOSIS — Z5321 Procedure and treatment not carried out due to patient leaving prior to being seen by health care provider: Secondary | ICD-10-CM | POA: Diagnosis not present

## 2023-12-18 DIAGNOSIS — E162 Hypoglycemia, unspecified: Secondary | ICD-10-CM | POA: Insufficient documentation

## 2023-12-18 LAB — CBG MONITORING, ED: Glucose-Capillary: 86 mg/dL (ref 70–99)

## 2023-12-18 NOTE — ED Provider Triage Note (Signed)
 Emergency Medicine Provider Triage Evaluation Note  Kelly Crosby , a 33 y.o. female  was evaluated in triage.  Pt complains of "feeling groggy" after using marijuana tonight.  Patient states that she "knows my body and I know I am pregnant even though everyone says I am not".  Patient giggling and drowsy appearing during exam.  Review of Systems  Positive: As above Negative: Vaginal bleeding or discharge, abdominal pai  Physical Exam  BP 119/76   Pulse 74   Temp 98 F (36.7 C)   Resp 16   Ht 5\' 6"  (1.676 m)   Wt 117 kg   LMP  (LMP Unknown)   SpO2 99%   BMI 41.63 kg/m  Gen:   Awake, no distress Resp:  Normal effort  MSK:   Moves extremities without difficulty  Other:   RRR no M/R/G.  Lungs CTAB.  Medical Decision Making  Medically screening exam initiated at 6:01 AM.  Appropriate orders placed.  Chester S Astarita was informed that the remainder of the evaluation will be completed by another provider, this initial triage assessment does not replace that evaluation, and the importance of remaining in the ED until their evaluation is complete.  This chart was dictated using voice recognition software, Dragon. Despite the best efforts of this provider to proofread and correct errors, errors may still occur which can change documentation meaning.    Kae Oram, PA-C 12/18/23 [phone]

## 2023-12-18 NOTE — ED Notes (Signed)
Pt called no answer x3  

## 2023-12-18 NOTE — ED Triage Notes (Signed)
 Pt BIB EMS from home with c/o feeling hypoglycemic and groggy. States that she estimates that she is [redacted] weeks pregnant, states that her first OBGYN is this month. Pt ambulatory in triage.  Cbg 86 with ems, 89 here

## 2023-12-23 ENCOUNTER — Encounter: Payer: MEDICAID | Admitting: Family

## 2023-12-23 NOTE — Progress Notes (Signed)
 Erroneous encounter-disregard

## 2023-12-27 ENCOUNTER — Encounter (HOSPITAL_COMMUNITY): Payer: Self-pay | Admitting: Emergency Medicine

## 2023-12-27 ENCOUNTER — Emergency Department (HOSPITAL_COMMUNITY)
Admission: EM | Admit: 2023-12-27 | Discharge: 2023-12-28 | Disposition: A | Discharge status: Home / Self Care | Payer: MEDICAID | Attending: Emergency Medicine | Admitting: Emergency Medicine

## 2023-12-27 DIAGNOSIS — Z91040 Latex allergy status: Secondary | ICD-10-CM | POA: Insufficient documentation

## 2023-12-27 DIAGNOSIS — F458 Other somatoform disorders: Secondary | ICD-10-CM | POA: Insufficient documentation

## 2023-12-27 DIAGNOSIS — F1290 Cannabis use, unspecified, uncomplicated: Secondary | ICD-10-CM | POA: Diagnosis present

## 2023-12-27 DIAGNOSIS — F1990 Other psychoactive substance use, unspecified, uncomplicated: Secondary | ICD-10-CM

## 2023-12-27 NOTE — ED Triage Notes (Addendum)
 Pt was getting ready to lie down after taking a bath and had mid to lower back pain. States felt groggy. Smells strongly of marijuana.

## 2023-12-28 ENCOUNTER — Emergency Department (HOSPITAL_COMMUNITY): Payer: MEDICAID

## 2023-12-28 LAB — URINALYSIS, ROUTINE W REFLEX MICROSCOPIC
Bilirubin Urine: NEGATIVE
Glucose, UA: NEGATIVE mg/dL
Hgb urine dipstick: NEGATIVE
Ketones, ur: 5 mg/dL — AB
Leukocytes,Ua: NEGATIVE
Nitrite: NEGATIVE
Protein, ur: NEGATIVE mg/dL
Specific Gravity, Urine: 1.021 (ref 1.005–1.030)
pH: 6 (ref 5.0–8.0)

## 2023-12-28 LAB — RAPID URINE DRUG SCREEN, HOSP PERFORMED
Amphetamines: NOT DETECTED
Barbiturates: NOT DETECTED
Benzodiazepines: NOT DETECTED
Cocaine: NOT DETECTED
Opiates: NOT DETECTED
Tetrahydrocannabinol: POSITIVE — AB

## 2023-12-28 LAB — PREGNANCY, URINE: Preg Test, Ur: NEGATIVE

## 2023-12-28 NOTE — ED Notes (Signed)
 Patient given [place] sandwich

## 2023-12-28 NOTE — Discharge Instructions (Signed)
 You were seen in the ER today. You are not pregnant. Please follow up with your outpatient providers as previously scheduled. Return to the ER with any new severe symptoms.

## 2023-12-28 NOTE — ED Provider Notes (Signed)
 [place] EMERGENCY DEPARTMENT AT [facility] Provider Note   CSN: [id number] Arrival date & time: 12/27/23  2323     History  Chief Complaint  Patient presents with   Back Pain    Kelly Crosby is a 33 y.o. female who presented initially complaining of back pain, patient extremely somnolent, smells of marijuana.  Later it became apparent that patient's concern is primarily that she believes she is pregnant but states "no one will believe they have not been pregnant for months".  Patient with history of schizoaffective disorder, states has not taken her Abilify  for the last few months.  Tearful, labile.  HPI     Home Medications Prior to Admission medications   Medication Sig Start Date End Date Taking? Authorizing Provider  albuterol  (VENTOLIN  HFA) 108 (90 Base) MCG/ACT inhaler Inhale 2 puffs into the lungs every 6 (six) hours as needed for wheezing or shortness of breath.    [provider]  ARIPiprazole  (ABILIFY ) 10 MG tablet Take 1 tablet (10 mg total) by mouth at bedtime. Patient not taking: Reported on 10/21/2023 10/11/23 11/10/23  Eleanore Grey, MD  EPINEPHrine  0.3 mg/0.3 mL IJ SOAJ injection Inject 0.3 mg into the muscle as needed (anaphylactic reaction). 10/11/23   Massengill, Elana Grayer, MD  Multiple Vitamin (MULTIVITAMIN WITH MINERALS) TABS tablet Take 1 tablet by mouth daily.    [provider]  nicotine  (NICODERM CQ  - DOSED IN MG/24 HOURS) 14 mg/24hr patch Place 1 patch (14 mg total) onto the skin daily. Patient not taking: Reported on 10/21/2023 10/12/23   Eleanore Grey, MD  nicotine  polacrilex (NICORETTE ) 2 MG gum Take 1 each (2 mg total) by mouth as needed for smoking cessation. Patient not taking: Reported on 10/21/2023 10/11/23   Eleanore Grey, MD  Norethindrone Acetate-Ethinyl Estrad-FE (BLISOVI 24 FE) 1-20 MG-MCG(24) tablet TAKE 1 TABLET BY MOUTH DAILY 11/23/23   Abigail Abler, MD  vitamin D3 (CHOLECALCIFEROL ) 25 MCG tablet Take 1  tablet (1,000 Units total) by mouth daily. Patient not taking: Reported on 10/21/2023 10/12/23   Eleanore Grey, MD      Allergies    Mustard, Oat, Latex, Amoxicillin , and Other    Review of Systems   Review of Systems  Respiratory: Negative.    Cardiovascular: Negative.   Gastrointestinal: Negative.   Genitourinary: Negative.   Musculoskeletal:  Positive for back pain.    Physical Exam Updated Vital Signs BP 112/71 (BP Location: Right Arm)   Pulse 65   Temp (!) 97.5 F (36.4 C) (Oral)   Resp 16   LMP  (LMP Unknown)   SpO2 100%  Physical Exam Vitals and nursing note reviewed.  Constitutional:      General: She is sleeping.     Appearance: She is obese. She is not ill-appearing or toxic-appearing.  HENT:     Head: Normocephalic and atraumatic.     Mouth/Throat:     Mouth: Mucous membranes are moist.     Pharynx: No oropharyngeal exudate or posterior oropharyngeal erythema.  Eyes:     General:        Right eye: No discharge.        Left eye: No discharge.     Conjunctiva/sclera: Conjunctivae normal.  Cardiovascular:     Rate and Rhythm: Normal rate and regular rhythm.     Pulses: Normal pulses.     Heart sounds: No murmur heard. Pulmonary:     Effort: Pulmonary effort is normal. No respiratory distress.  Breath sounds: Normal breath sounds. No wheezing or rales.  Abdominal:     General: Bowel sounds are normal. There is no distension.     Tenderness: There is no abdominal tenderness. There is no right CVA tenderness or left CVA tenderness.  Musculoskeletal:        General: No deformity.     Cervical back: Neck supple.  Skin:    General: Skin is warm and dry.  Neurological:     Mental Status: Mental status is at baseline.  Psychiatric:        Mood and Affect: Mood normal.        Behavior: Behavior is uncooperative.     ED Results / Procedures / Treatments   Labs (all labs ordered are listed, but only abnormal results are displayed) Labs Reviewed   URINALYSIS, ROUTINE W REFLEX MICROSCOPIC - Abnormal; Notable for the following components:      Result Value   Ketones, ur 5 (*)    All other components within normal limits  RAPID URINE DRUG SCREEN, HOSP PERFORMED - Abnormal; Notable for the following components:   Tetrahydrocannabinol POSITIVE (*)    All other components within normal limits  PREGNANCY, URINE    EKG None  Radiology DG Chest Port 1 View Result Date: 12/28/2023 CLINICAL DATA:  Fatigue weakness EXAM: PORTABLE CHEST 1 VIEW COMPARISON:  11/22/2023 FINDINGS: The heart size and mediastinal contours are within normal limits. Both lungs are clear. The visualized skeletal structures are unremarkable. IMPRESSION: No active disease. Electronically Signed   By: Violeta Grey M.D.   On: 12/28/2023 03:38    Procedures Procedures    Medications Ordered in ED Medications - No data to display  ED Course/ Medical Decision Making/ A&P Clinical Course as of 12/28/23 0650  Mon Dec 28, 2023  0114 Patient refused xray. [RS]    Clinical Course User Index [RS] Jeyli Zwicker, Adelle Agent, PA-C                                 Medical Decision Making 33 year old with psych history who presents with believe that she is pregnant, would like an ultrasound, complaining of back pain.  Reassuring vitals throughout.  Reassuring physical exam.  Patient extremely somnolent, smells of marijuana, appears intoxicated.  Amount and/or Complexity of Data Reviewed Labs: ordered.    Details: UDS positive for THC, UA negative, urine pregnancy negative. Radiology: ordered.    Details: X-ray unremarkable.   Patient adamant she is pregnant, bedside ultrasound performed by this provider to prove to the patient that she is not pregnant, patient tearful but accepting of this news.  Requesting to be discharged at this time.  Clinical concern for emergent condition notable for ED workup and patient management is exceedingly low.  CeeCee voiced understanding  of her medical evaluation and treatment plan. Each of their questions answered to their expressed satisfaction.  Return precautions were given.  Patient is well-appearing, stable, and was discharged in good condition.   This chart was dictated using voice recognition software, Dragon. Despite the best efforts of this provider to proofread and correct errors, errors may still occur which can change documentation meaning.         Final Clinical Impression(s) / ED Diagnoses Final diagnoses:  Drug use  Pseudocyesis    Rx / DC Orders ED Discharge Orders     None         Toye Rouillard R, PA-C  12/28/23 1610    Ballard Bongo, MD 12/30/23 [phone]

## 2023-12-28 NOTE — ED Notes (Signed)
 Patient made aware of needing urine sample

## 2023-12-28 NOTE — ED Notes (Signed)
 Attempted to get urine on patient, but she is unable to urinate at this time. Will try again later.

## 2023-12-28 NOTE — ED Notes (Signed)
 Pt given cranberry juice. Pt in the room crying, stating she is pregnant and nobody believes her. Patient requesting for an ultrasound.

## 2023-12-31 ENCOUNTER — Ambulatory Visit (HOSPITAL_COMMUNITY)
Admission: EM | Admit: 2023-12-31 | Discharge: 2024-01-01 | Disposition: A | Discharge status: Home / Self Care | Payer: MEDICAID | Attending: Psychiatry | Admitting: Psychiatry

## 2023-12-31 DIAGNOSIS — F3010 Manic episode without psychotic symptoms, unspecified: Secondary | ICD-10-CM | POA: Diagnosis not present

## 2023-12-31 DIAGNOSIS — Z760 Encounter for issue of repeat prescription: Secondary | ICD-10-CM | POA: Insufficient documentation

## 2023-12-31 DIAGNOSIS — Z9151 Personal history of suicidal behavior: Secondary | ICD-10-CM | POA: Insufficient documentation

## 2023-12-31 DIAGNOSIS — F1210 Cannabis abuse, uncomplicated: Secondary | ICD-10-CM | POA: Insufficient documentation

## 2023-12-31 DIAGNOSIS — F4310 Post-traumatic stress disorder, unspecified: Secondary | ICD-10-CM | POA: Insufficient documentation

## 2023-12-31 DIAGNOSIS — F250 Schizoaffective disorder, bipolar type: Secondary | ICD-10-CM | POA: Diagnosis not present

## 2023-12-31 DIAGNOSIS — R45851 Suicidal ideations: Secondary | ICD-10-CM | POA: Insufficient documentation

## 2023-12-31 DIAGNOSIS — F69 Unspecified disorder of adult personality and behavior: Secondary | ICD-10-CM

## 2023-12-31 LAB — POCT URINE DRUG SCREEN - MANUAL ENTRY (I-SCREEN)
POC Amphetamine UR: NOT DETECTED
POC Buprenorphine (BUP): NOT DETECTED
POC Cocaine UR: NOT DETECTED
POC Marijuana UR: POSITIVE — AB
POC Methadone UR: NOT DETECTED
POC Methamphetamine UR: NOT DETECTED
POC Morphine: NOT DETECTED
POC Oxazepam (BZO): NOT DETECTED
POC Oxycodone UR: NOT DETECTED
POC Secobarbital (BAR): NOT DETECTED

## 2023-12-31 LAB — CBC WITH DIFFERENTIAL/PLATELET
Abs Immature Granulocytes: 0.02 10*3/uL (ref 0.00–0.07)
Basophils Absolute: 0.1 10*3/uL (ref 0.0–0.1)
Basophils Relative: 1 %
Eosinophils Absolute: 0.1 10*3/uL (ref 0.0–0.5)
Eosinophils Relative: 1 %
HCT: 37.6 % (ref 36.0–46.0)
Hemoglobin: 13.4 g/dL (ref 12.0–15.0)
Immature Granulocytes: 0 %
Lymphocytes Relative: 32 %
Lymphs Abs: 3.3 10*3/uL (ref 0.7–4.0)
MCH: 29.3 pg (ref 26.0–34.0)
MCHC: 35.6 g/dL (ref 30.0–36.0)
MCV: 82.1 fL (ref 80.0–100.0)
Monocytes Absolute: 0.6 10*3/uL (ref 0.1–1.0)
Monocytes Relative: 6 %
Neutro Abs: 6.1 10*3/uL (ref 1.7–7.7)
Neutrophils Relative %: 60 %
Platelets: 283 10*3/uL (ref 150–400)
RBC: 4.58 MIL/uL (ref 3.87–5.11)
RDW: 13.4 % (ref 11.5–15.5)
WBC: 10.1 10*3/uL (ref 4.0–10.5)
nRBC: 0 % (ref 0.0–0.2)

## 2023-12-31 LAB — TSH: TSH: 1.591 u[IU]/mL (ref 0.350–4.500)

## 2023-12-31 LAB — POC URINE PREG, ED: Preg Test, Ur: NEGATIVE

## 2023-12-31 MED ORDER — NICOTINE POLACRILEX 2 MG MT GUM: 2.0000 mg | CHEWING_GUM | OROMUCOSAL | Status: DC | PRN

## 2023-12-31 MED ORDER — NICOTINE 14 MG/24HR TD PT24
14.0000 mg | MEDICATED_PATCH | Freq: Every day | TRANSDERMAL | Status: DC
  Filled 2023-12-31: qty 1

## 2023-12-31 MED ORDER — TRAZODONE HCL 50 MG PO TABS
50.0000 mg | ORAL_TABLET | Freq: Every day | ORAL | Status: DC
  Filled 2023-12-31: qty 1

## 2023-12-31 MED ORDER — EPINEPHRINE 0.3 MG/0.3ML IJ SOAJ: 0.3000 mg | INTRAMUSCULAR | Status: DC | PRN

## 2023-12-31 MED ORDER — ARIPIPRAZOLE 2 MG PO TABS
2.0000 mg | ORAL_TABLET | Freq: Once | ORAL | Status: AC
  Administered 2023-12-31: 2 mg via ORAL
  Filled 2023-12-31: qty 1

## 2023-12-31 MED ORDER — OLANZAPINE 10 MG IM SOLR: 5.0000 mg | Freq: Three times a day (TID) | INTRAMUSCULAR | Status: DC | PRN

## 2023-12-31 MED ORDER — ALBUTEROL SULFATE HFA 108 (90 BASE) MCG/ACT IN AERS
2.0000 | INHALATION_SPRAY | Freq: Four times a day (QID) | RESPIRATORY_TRACT | Status: DC | PRN
Start: 2023-12-31 — End: 2024-01-01

## 2023-12-31 MED ORDER — OLANZAPINE 5 MG PO TBDP: 5.0000 mg | ORAL_TABLET | Freq: Three times a day (TID) | ORAL | Status: DC | PRN

## 2023-12-31 MED ORDER — MAGNESIUM HYDROXIDE 400 MG/5ML PO SUSP: 30.0000 mL | Freq: Every day | ORAL | Status: DC | PRN

## 2023-12-31 MED ORDER — ACETAMINOPHEN 325 MG PO TABS
650.0000 mg | ORAL_TABLET | Freq: Four times a day (QID) | ORAL | Status: DC | PRN
  Administered 2024-01-01: 650 mg via ORAL
  Filled 2023-12-31: qty 2

## 2023-12-31 MED ORDER — ALUM & MAG HYDROXIDE-SIMETH 200-200-20 MG/5ML PO SUSP: 30.0000 mL | ORAL | Status: DC | PRN

## 2023-12-31 MED ORDER — OLANZAPINE 10 MG IM SOLR: 10.0000 mg | Freq: Three times a day (TID) | INTRAMUSCULAR | Status: DC | PRN

## 2023-12-31 NOTE — ED Notes (Signed)
 Patient A&Ox4. Patient endorses passive SI without plan or intent. Denies HI/AVH. Patient oriented to unit. Meal and beverage provided. Patient denies any physical complaints when asked. No acute distress noted. Support and encouragement provided. Routine safety checks conducted according to facility protocol. Encouraged patient to notify staff if thoughts of harm toward self or others arise. Patient verbalize understanding and agreement. Will continue to monitor for safety.
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
## 2023-12-31 NOTE — ED Provider Notes (Signed)
 [facility] Continuous Assessment Admission H&P  Date: 01/01/24 Patient Name: Kelly Crosby MRN: [id number] Chief Complaint: manic episode   Diagnoses:  Final diagnoses:  Manic behavior (HCC)  Encounter for medication refill  Behavior concern in adult  Schizoaffective disorder, bipolar type [facility])  Marijuana abuse    HPI: Kelly Crosby, 33 y/o female with a history of schizoaffective disorder, psychotic episodes, homelessness, suicidal ideation.  Presented to [facility] via [facility].  Per the patient she was having an episode and got triggered not receiving her Abilify  injection.  According to the patient she missed her shot and it is time to get 1 now.  According to the patient she lives alone, self-employed, and was having suicidal thoughts today but she is currently not suicidal.  Patient also stated that she last was hospitalized at [facility] in March.  I review of patient records show multiple ED visits for similar condition.  Copied from triage note: Kelly- presents to [facility] voluntarily, accompanied by GPD/BHRT due to medication refill (Abilify  shot). Pt reports that she is approaching a month since last dose. Pt endorsed SI, no plan or intent earlier today, but denies at this time. Pt states " i just know I have too much to live for". Pt is tearful at times as she describes stress and depression due to loss of pregnancy. Pt reports diagnosis of schizoaffective disorder. Pt also reports being established with [facility] for outpatient therapy. Pt currently denies SI,HI,AVH and alcohol use.  Face-to-face evaluation of patient, patient is alert and oriented x 4, speech is clear, maintained minimal eye contact.  Patient does appear anxious and worried.  Patient currently denies SI, HI, AVH or paranoia.  Reports she smoked marijuana on a daily basis the last time was within the last 24 hours.  Denies alcohol use.  Denies any other illicit drug use.  Currently denies  access to guns.  Patient currently denies wanting to hurt herself or others.  At this present moment she does not appear to be influenced by internal stimuli.  Patient stating that she can tell the way she feels that she need to get her Abilify  injection.  Given patient prior history and current presentation writer discussed with patient the need for admissions and also discussed with patient that she could get her Abilify  shot in the morning when the walking psychiatry comes in.  For Horticulturist, commercial will order Abilify  1 mg p.o.  Recommend observation  Total Time spent with patient: 30 minutes  Musculoskeletal  Strength & Muscle Tone: within normal limits Gait & Station: normal Patient leans: N/A  Psychiatric Specialty Exam  Presentation General Appearance:  Casual  Eye Contact: Fair  Speech: Clear and Coherent  Speech Volume: Normal  Handedness: Right   Mood and Affect  Mood: Euthymic  Affect: Congruent   Thought Process  Thought Processes: Coherent  Descriptions of Associations:Intact  Orientation:Full (Time, Place and Person)  Thought Content:WDL  Diagnosis of Schizophrenia or Schizoaffective disorder in past: Yes  Duration of Psychotic Symptoms: No data recorded Hallucinations:Hallucinations: None  Ideas of Reference:None  Suicidal Thoughts:Suicidal Thoughts: Yes, Passive SI Passive Intent and/or Plan: Without Intent; Without Plan  Homicidal Thoughts:Homicidal Thoughts: No   Sensorium  Memory: Immediate Fair  Judgment: Fair  Insight: Fair   Chartered certified accountant: Fair  Attention Span: Fair  Recall: Fair  Fund of Knowledge: Good  Language: Good   Psychomotor Activity  Psychomotor Activity: Psychomotor Activity: Normal   Assets  Assets: Desire for Improvement; Resilience; Social Support   Sleep  Sleep: Sleep: Fair Number of Hours of Sleep: 6   Nutritional Assessment (For OBS and FBC admissions  only) Has the patient had a weight loss or gain of 10 pounds or more in the last 3 months?: No Has the patient had a decrease in food intake/or appetite?: No Does the patient have dental problems?: No Does the patient have eating habits or behaviors that may be indicators of an eating disorder including binging or inducing vomiting?: No Has the patient recently lost weight without trying?: 0 Has the patient been eating poorly because of a decreased appetite?: 0 Malnutrition Screening Tool Score: 0    Physical Exam HENT:     Head: Normocephalic.     Nose: Nose normal.  Eyes:     Pupils: Pupils are equal, round, and reactive to light.  Cardiovascular:     Rate and Rhythm: Normal rate.  Pulmonary:     Effort: Pulmonary effort is normal.  Musculoskeletal:        General: Normal range of motion.     Cervical back: Normal range of motion.  Neurological:     General: No focal deficit present.     Mental Status: She is alert.  Psychiatric:        Mood and Affect: Mood normal.        Behavior: Behavior normal.        Thought Content: Thought content normal.        Judgment: Judgment normal.    Review of Systems  Constitutional: Negative.   HENT: Negative.    Eyes: Negative.   Respiratory: Negative.    Cardiovascular: Negative.   Gastrointestinal: Negative.   Genitourinary: Negative.   Musculoskeletal: Negative.   Skin: Negative.   Neurological: Negative.   Psychiatric/Behavioral:  Positive for depression, substance abuse and suicidal ideas. The patient is nervous/anxious.     Blood pressure 111/78, pulse 68, temperature 98.8 F (37.1 C), temperature source Oral, resp. rate 18, SpO2 97%. There is no height or weight on file to calculate BMI.  Past Psychiatric History: Schizoaffective disorder, psychosis, homelessness, SI  Is the patient at risk to self? Yes  Has the patient been a risk to self in the past 6 months? Yes .    Has the patient been a risk to self within the  distant past? Yes   Is the patient a risk to others? No   Has the patient been a risk to others in the past 6 months? No   Has the patient been a risk to others within the distant past? No   Past Medical History: See chart  Family History: Unknown  Social History: Marijuana use  Last Labs:  Admission on 12/31/2023  Component Date Value Ref Range Status   WBC 12/31/2023 10.1  4.0 - 10.5 K/uL Final   RBC 12/31/2023 4.58  3.87 - 5.11 MIL/uL Final   Hemoglobin 12/31/2023 13.4  12.0 - 15.0 g/dL Final   HCT 16/05/9603 37.6  36.0 - 46.0 % Final   MCV 12/31/2023 82.1  80.0 - 100.0 fL Final   MCH 12/31/2023 29.3  26.0 - 34.0 pg Final   MCHC 12/31/2023 35.6  30.0 - 36.0 g/dL Final   RDW 54/04/8118 13.4  11.5 - 15.5 % Final   Platelets 12/31/2023 283  150 - 400 K/uL Final   nRBC 12/31/2023 0.0  0.0 - 0.2 % Final   Neutrophils Relative % 12/31/2023 60  %  Final   Neutro Abs 12/31/2023 6.1  1.7 - 7.7 K/uL Final   Lymphocytes Relative 12/31/2023 32  % Final   Lymphs Abs 12/31/2023 3.3  0.7 - 4.0 K/uL Final   Monocytes Relative 12/31/2023 6  % Final   Monocytes Absolute 12/31/2023 0.6  0.1 - 1.0 K/uL Final   Eosinophils Relative 12/31/2023 1  % Final   Eosinophils Absolute 12/31/2023 0.1  0.0 - 0.5 K/uL Final   Basophils Relative 12/31/2023 1  % Final   Basophils Absolute 12/31/2023 0.1  0.0 - 0.1 K/uL Final   Immature Granulocytes 12/31/2023 0  % Final   Abs Immature Granulocytes 12/31/2023 0.02  0.00 - 0.07 K/uL Final   Performed at [facility], [address] [place], [place] [address]   Sodium 12/31/2023 139  135 - 145 mmol/L Final   Potassium 12/31/2023 3.7  3.5 - 5.1 mmol/L Final   Chloride 12/31/2023 105  98 - 111 mmol/L Final   CO2 12/31/2023 24  22 - 32 mmol/L Final   Glucose, Bld 12/31/2023 97  70 - 99 mg/dL Final   Glucose reference range applies only to samples taken after fasting for at least 8 hours.   BUN 12/31/2023 8  6 - 20 mg/dL Final   Creatinine, Ser 12/31/2023  0.75  0.44 - 1.00 mg/dL Final   Calcium 60/45/4098 9.2  8.9 - 10.3 mg/dL Final   Total Protein 11/91/4782 7.0  6.5 - 8.1 g/dL Final   Albumin 95/62/1308 3.8  3.5 - 5.0 g/dL Final   AST 65/78/4696 17  15 - 41 U/L Final   ALT 12/31/2023 23  0 - 44 U/L Final   Alkaline Phosphatase 12/31/2023 61  38 - 126 U/L Final   Total Bilirubin 12/31/2023 0.5  0.0 - 1.2 mg/dL Final   GFR, Estimated 12/31/2023 >60  >60 mL/min Final   Comment: (NOTE) Calculated using the CKD-EPI Creatinine Equation (2021)    Anion gap 12/31/2023 10  5 - 15 Final   Performed at [facility], [address], [place], [place] [address]   Alcohol, Ethyl (B) 12/31/2023 <15  <15 mg/dL Final   Comment: (NOTE) For medical purposes only. Performed at [facility], [address] [place], [place] [address]    TSH 12/31/2023 1.591  0.350 - 4.500 uIU/mL Final   Comment: Performed by a 3rd Generation assay with a functional sensitivity of <=0.01 uIU/mL. Performed at [facility], [address], [place], [place] [address]    Preg Test, Ur 12/31/2023 Negative  Negative Final   POC Amphetamine UR 12/31/2023 None Detected  NONE DETECTED (Cut Off Level 1000 ng/mL) Final   POC Secobarbital (BAR) 12/31/2023 None Detected  NONE DETECTED (Cut Off Level 300 ng/mL) Final   POC Buprenorphine (BUP) 12/31/2023 None Detected  NONE DETECTED (Cut Off Level 10 ng/mL) Final   POC Oxazepam (BZO) 12/31/2023 None Detected  NONE DETECTED (Cut Off Level 300 ng/mL) Final   POC Cocaine UR 12/31/2023 None Detected  NONE DETECTED (Cut Off Level 300 ng/mL) Final   POC Methamphetamine UR 12/31/2023 None Detected  NONE DETECTED (Cut Off Level 1000 ng/mL) Final   POC Morphine 12/31/2023 None Detected  NONE DETECTED (Cut Off Level 300 ng/mL) Final   POC Methadone UR 12/31/2023 None Detected  NONE DETECTED (Cut Off Level 300 ng/mL) Final   POC Oxycodone UR 12/31/2023 None Detected  NONE DETECTED (Cut Off Level 100 ng/mL) Final   POC Marijuana  UR 12/31/2023 Positive (A)  NONE DETECTED (Cut Off Level 50 ng/mL) Final  Admission on 12/27/2023, Discharged on 12/28/2023  Component Date Value Ref Range Status   Color, Urine 12/28/2023 YELLOW  YELLOW Final   APPearance 12/28/2023 CLEAR  CLEAR Final   Specific Gravity, Urine 12/28/2023 1.021  1.005 - 1.030 Final   pH 12/28/2023 6.0  5.0 - 8.0 Final   Glucose, UA 12/28/2023 NEGATIVE  NEGATIVE mg/dL Final   Hgb urine dipstick 12/28/2023 NEGATIVE  NEGATIVE Final   Bilirubin Urine 12/28/2023 NEGATIVE  NEGATIVE Final   Ketones, ur 12/28/2023 5 (A)  NEGATIVE mg/dL Final   Protein, ur 52/84/1324 NEGATIVE  NEGATIVE mg/dL Final   Nitrite 40/05/2724 NEGATIVE  NEGATIVE Final   Leukocytes,Ua 12/28/2023 NEGATIVE  NEGATIVE Final   Performed at [facility], [address], [place], [place] [address]   Opiates 12/28/2023 NONE DETECTED  NONE DETECTED Final   Cocaine 12/28/2023 NONE DETECTED  NONE DETECTED Final   Benzodiazepines 12/28/2023 NONE DETECTED  NONE DETECTED Final   Amphetamines 12/28/2023 NONE DETECTED  NONE DETECTED Final   Tetrahydrocannabinol 12/28/2023 POSITIVE (A)  NONE DETECTED Final   Barbiturates 12/28/2023 NONE DETECTED  NONE DETECTED Final   Comment: (NOTE) DRUG SCREEN FOR MEDICAL PURPOSES ONLY.  IF CONFIRMATION IS NEEDED FOR ANY PURPOSE, NOTIFY LAB WITHIN 5 DAYS.  LOWEST DETECTABLE LIMITS FOR URINE DRUG SCREEN Drug Class                     Cutoff (ng/mL) Amphetamine and metabolites    1000 Barbiturate and metabolites    200 Benzodiazepine                 200 Opiates and metabolites        300 Cocaine and metabolites        300 THC                            50 Performed at [facility], [address], [place], [place] [address]    Preg Test, Ur 12/28/2023 NEGATIVE  NEGATIVE Final   Comment:        THE SENSITIVITY OF THIS METHODOLOGY IS >25 mIU/mL. Performed at [facility], [address], [place],  [place] [address]   Admission on 12/18/2023, Discharged on 12/18/2023  Component Date Value Ref Range Status   Glucose-Capillary 12/18/2023 86  70 - 99 mg/dL Final   Glucose reference range applies only to samples taken after fasting for at least 8 hours.  Admission on 11/22/2023, Discharged on 11/22/2023  Component Date Value Ref Range Status   HIV-1 P24 Antigen - HIV24 11/22/2023 NON REACTIVE  NON REACTIVE Final   Comment: (NOTE) Detection of p24 may be inhibited by biotin in the sample, causing false negative results in acute infection.    HIV 1/2 Antibodies 11/22/2023 NON REACTIVE  NON REACTIVE Final   Interpretation (HIV Ag Ab) 11/22/2023 A non reactive test result means that HIV 1 or HIV 2 antibodies and HIV 1 p24 antigen were not detected in the specimen.   Final   Performed at [facility], [address] [place], [place] [address]   Sodium 11/22/2023 135  135 - 145 mmol/L Final   Potassium 11/22/2023 3.7  3.5 - 5.1 mmol/L Final   Chloride 11/22/2023 104  98 - 111 mmol/L Final   CO2 11/22/2023 25  22 - 32 mmol/L Final  Glucose, Bld 11/22/2023 88  70 - 99 mg/dL Final   Glucose reference range applies only to samples taken after fasting for at least 8 hours.   BUN 11/22/2023 15  6 - 20 mg/dL Final   Creatinine, Ser 11/22/2023 0.80  0.44 - 1.00 mg/dL Final   Calcium 66/44/0347 8.9  8.9 - 10.3 mg/dL Final   Total Protein 42/59/5638 6.9  6.5 - 8.1 g/dL Final   Albumin 75/64/3329 3.9  3.5 - 5.0 g/dL Final   AST 51/88/4166 20  15 - 41 U/L Final   ALT 11/22/2023 33  0 - 44 U/L Final   Alkaline Phosphatase 11/22/2023 50  38 - 126 U/L Final   Total Bilirubin 11/22/2023 0.8  0.0 - 1.2 mg/dL Final   GFR, Estimated 11/22/2023 >60  >60 mL/min Final   Comment: (NOTE) Calculated using the CKD-EPI Creatinine Equation (2021)    Anion gap 11/22/2023 6  5 - 15 Final   Performed at [facility], [address], [place], [place] [address]   HCV Ab 11/22/2023 NON  REACTIVE  NON REACTIVE Final   Comment: (NOTE) Nonreactive HCV antibody screen is consistent with no HCV infections,  unless recent infection is suspected or other evidence exists to indicate HCV infection.  Performed at [facility], [address], [place], [place] [address]    Hepatitis B Surface Ag 11/22/2023 NON REACTIVE  NON REACTIVE Final   Performed at [facility], [address], [place], [place] [address]   RPR Ser Ql 11/22/2023 NON REACTIVE  NON REACTIVE Final   Performed at [facility], [address], [place], [place] [address]   Preg, Serum 11/22/2023 NEGATIVE  NEGATIVE Final   Comment:        THE SENSITIVITY OF THIS METHODOLOGY IS >10 mIU/mL. Performed at [facility], [address], [place], [place] [address]   Admission on 11/15/2023, Discharged on 11/15/2023  Component Date Value Ref Range Status   Preg Test, Ur 11/15/2023 NEGATIVE  NEGATIVE Final   Comment:        THE SENSITIVITY OF THIS METHODOLOGY IS >24 mIU/mL    WBC 11/15/2023 10.1  4.0 - 10.5 K/uL Final   RBC 11/15/2023 4.43  3.87 - 5.11 MIL/uL Final   Hemoglobin 11/15/2023 12.8  12.0 - 15.0 g/dL Final   HCT 02/01/7627 36.7  36.0 - 46.0 % Final   MCV 11/15/2023 82.8  80.0 - 100.0 fL Final   MCH 11/15/2023 28.9  26.0 - 34.0 pg Final   MCHC 11/15/2023 34.9  30.0 - 36.0 g/dL Final   RDW 31/51/7616 13.4  11.5 - 15.5 % Final   Platelets 11/15/2023 325  150 - 400 K/uL Final   nRBC 11/15/2023 0.0  0.0 - 0.2 % Final   Neutrophils Relative % 11/15/2023 59  % Final   Neutro Abs 11/15/2023 6.0  1.7 - 7.7 K/uL Final   Lymphocytes Relative 11/15/2023 32  % Final   Lymphs Abs 11/15/2023 3.2  0.7 - 4.0 K/uL Final   Monocytes Relative 11/15/2023 6  % Final   Monocytes Absolute 11/15/2023 0.6  0.1 - 1.0 K/uL Final   Eosinophils Relative 11/15/2023 1  % Final   Eosinophils Absolute 11/15/2023 0.1  0.0 - 0.5 K/uL Final   Basophils Relative 11/15/2023 1  % Final   Basophils  Absolute 11/15/2023 0.1  0.0 - 0.1 K/uL Final   Immature Granulocytes 11/15/2023 1  % Final  Abs Immature Granulocytes 11/15/2023 0.05  0.00 - 0.07 K/uL Final   Performed at [facility], [address] [place], [place] [address]   Sodium 11/15/2023 139  135 - 145 mmol/L Final   Potassium 11/15/2023 3.5  3.5 - 5.1 mmol/L Final   Chloride 11/15/2023 108  98 - 111 mmol/L Final   CO2 11/15/2023 24  22 - 32 mmol/L Final   Glucose, Bld 11/15/2023 108 (H)  70 - 99 mg/dL Final   Glucose reference range applies only to samples taken after fasting for at least 8 hours.   BUN 11/15/2023 7  6 - 20 mg/dL Final   Creatinine, Ser 11/15/2023 0.84  0.44 - 1.00 mg/dL Final   Calcium 60/45/4098 8.9  8.9 - 10.3 mg/dL Final   Total Protein 11/91/4782 6.6  6.5 - 8.1 g/dL Final   Albumin 95/62/1308 3.7  3.5 - 5.0 g/dL Final   AST 65/78/4696 25  15 - 41 U/L Final   ALT 11/15/2023 37  0 - 44 U/L Final   Alkaline Phosphatase 11/15/2023 52  38 - 126 U/L Final   Total Bilirubin 11/15/2023 0.8  0.0 - 1.2 mg/dL Final   GFR, Estimated 11/15/2023 >60  >60 mL/min Final   Comment: (NOTE) Calculated using the CKD-EPI Creatinine Equation (2021)    Anion gap 11/15/2023 7  5 - 15 Final   Performed at [facility], [address], [place], [place] [address]   Lipase 11/15/2023 30  11 - 51 U/L Final   Performed at [facility], [address], [place], [place] [address]   Color, Urine 11/15/2023 YELLOW  YELLOW Final   APPearance 11/15/2023 HAZY (A)  CLEAR Final   Specific Gravity, Urine 11/15/2023 1.030  1.005 - 1.030 Final   pH 11/15/2023 5.0  5.0 - 8.0 Final   Glucose, UA 11/15/2023 NEGATIVE  NEGATIVE mg/dL Final   Hgb urine dipstick 11/15/2023 NEGATIVE  NEGATIVE Final   Bilirubin Urine 11/15/2023 NEGATIVE  NEGATIVE Final   Ketones, ur 11/15/2023 5 (A)  NEGATIVE mg/dL Final   Protein, ur 40/05/2724 NEGATIVE  NEGATIVE mg/dL Final   Nitrite 36/64/4034 NEGATIVE  NEGATIVE Final   Leukocytes,Ua  11/15/2023 NEGATIVE  NEGATIVE Final   Performed at [facility], [address], [place], [place] [address]  Admission on 10/20/2023, Discharged on 10/21/2023  Component Date Value Ref Range Status   Sodium 10/20/2023 136  135 - 145 mmol/L Final   Potassium 10/20/2023 3.8  3.5 - 5.1 mmol/L Final   Chloride 10/20/2023 105  98 - 111 mmol/L Final   CO2 10/20/2023 19 (L)  22 - 32 mmol/L Final   Glucose, Bld 10/20/2023 93  70 - 99 mg/dL Final   Glucose reference range applies only to samples taken after fasting for at least 8 hours.   BUN 10/20/2023 6  6 - 20 mg/dL Final   Creatinine, Ser 10/20/2023 1.06 (H)  0.44 - 1.00 mg/dL Final   Calcium 56/38/7564 8.9  8.9 - 10.3 mg/dL Final   Total Protein 33/29/5188 6.7  6.5 - 8.1 g/dL Final   Albumin 41/66/0630 3.7  3.5 - 5.0 g/dL Final   AST 16/08/930 27  15 - 41 U/L Final   ALT 10/20/2023 39  0 - 44 U/L Final   Alkaline Phosphatase 10/20/2023 52  38 - 126 U/L Final   Total Bilirubin 10/20/2023 0.7  0.0 - 1.2 mg/dL Final   GFR, Estimated 10/20/2023 >60  >60 mL/min  Final   Comment: (NOTE) Calculated using the CKD-EPI Creatinine Equation (2021)    Anion gap 10/20/2023 12  5 - 15 Final   Performed at [facility], [address], [place], [place] [address]   Alcohol, Ethyl (B) 10/20/2023 <10  <10 mg/dL Final   Comment: (NOTE) Lowest detectable limit for serum alcohol is 10 mg/dL.  For medical purposes only. Performed at [facility], [address], [place], [place] [address]    Salicylate Lvl 10/20/2023 <7.0 (L)  7.0 - 30.0 mg/dL Final   Performed at [facility], [address] [place], [place] [address]   Acetaminophen  (Tylenol ), Serum 10/20/2023 <10 (L)  10 - 30 ug/mL Final   Comment: (NOTE) Therapeutic concentrations vary significantly. A range of 10-30 ug/mL  may be an effective concentration for many patients. However, some  are best treated at concentrations outside of this range. Acetaminophen   concentrations >150 ug/mL at 4 hours after ingestion  and >50 ug/mL at 12 hours after ingestion are often associated with  toxic reactions.  Performed at [facility], [address], [place], [place] [address]    WBC 10/20/2023 10.5  4.0 - 10.5 K/uL Final   RBC 10/20/2023 4.47  3.87 - 5.11 MIL/uL Final   Hemoglobin 10/20/2023 13.0  12.0 - 15.0 g/dL Final   HCT 96/29/5284 37.2  36.0 - 46.0 % Final   MCV 10/20/2023 83.2  80.0 - 100.0 fL Final   MCH 10/20/2023 29.1  26.0 - 34.0 pg Final   MCHC 10/20/2023 34.9  30.0 - 36.0 g/dL Final   RDW 13/24/4010 13.2  11.5 - 15.5 % Final   Platelets 10/20/2023 355  150 - 400 K/uL Final   nRBC 10/20/2023 0.0  0.0 - 0.2 % Final   Performed at [facility], [address] [place], [place] [address]   Opiates 10/20/2023 NONE DETECTED  NONE DETECTED Final   Cocaine 10/20/2023 NONE DETECTED  NONE DETECTED Final   Benzodiazepines 10/20/2023 NONE DETECTED  NONE DETECTED Final   Amphetamines 10/20/2023 NONE DETECTED  NONE DETECTED Final   Tetrahydrocannabinol 10/20/2023 POSITIVE (A)  NONE DETECTED Final   Barbiturates 10/20/2023 NONE DETECTED  NONE DETECTED Final   Comment: (NOTE) DRUG SCREEN FOR MEDICAL PURPOSES ONLY.  IF CONFIRMATION IS NEEDED FOR ANY PURPOSE, NOTIFY LAB WITHIN 5 DAYS.  LOWEST DETECTABLE LIMITS FOR URINE DRUG SCREEN Drug Class                     Cutoff (ng/mL) Amphetamine and metabolites    1000 Barbiturate and metabolites    200 Benzodiazepine                 200 Opiates and metabolites        300 Cocaine and metabolites        300 THC                            50 Performed at [facility], [address] [place], [place] [address]    hCG, Beta Chain, Vinton Greig, Laneta Pintos 10/20/2023 <1  <5 mIU/mL Final   Comment:          GEST. AGE      CONC.  (mIU/mL)   <=1 WEEK        5 - 50     2 WEEKS       50 - 500  3 WEEKS       100 - 10,000     4 WEEKS     1,000 - 30,000     5 WEEKS     3,500 - 115,000   6-8 WEEKS      12,000 - 270,000    12 WEEKS     15,000 - 220,000        FEMALE AND NON-PREGNANT FEMALE:     LESS THAN 5 mIU/mL Performed at [facility], [address] [place], [place] [address]    Glucose-Capillary 10/21/2023 104 (H)  70 - 99 mg/dL Final   Glucose reference range applies only to samples taken after fasting for at least 8 hours.  Admission on 10/04/2023, Discharged on 10/11/2023  Component Date Value Ref Range Status   Vit D, 25-Hydroxy 10/05/2023 14.22 (L)  30 - 100 ng/mL Final   Comment: (NOTE) Vitamin D  deficiency has been defined by the Institute of Medicine  and an Endocrine Society practice guideline as a level of serum 25-OH  vitamin D  less than 20 ng/mL (1,2). The Endocrine Society went on to  further define vitamin D  insufficiency as a level between 21 and 29  ng/mL (2).  1. IOM (Institute of Medicine). 2010. Dietary reference intakes for  calcium and D. [place]: [employer]. 2. Holick MF, Binkley Brainerd, Bischoff-Ferrari HA, et al. Evaluation,  treatment, and prevention of vitamin D  deficiency: an Endocrine  Society clinical practice guideline, JCEM. 2011 Jul; 96(7): 1911-30.  Performed at [facility], [address], [place], [place] [address]    WBC 10/08/2023 7.3  4.0 - 10.5 K/uL Final   RBC 10/08/2023 4.85  3.87 - 5.11 MIL/uL Final   Hemoglobin 10/08/2023 14.1  12.0 - 15.0 g/dL Final   HCT 62/13/0865 40.6  36.0 - 46.0 % Final   MCV 10/08/2023 83.7  80.0 - 100.0 fL Final   MCH 10/08/2023 29.1  26.0 - 34.0 pg Final   MCHC 10/08/2023 34.7  30.0 - 36.0 g/dL Final   RDW 78/46/9629 12.8  11.5 - 15.5 % Final   Platelets 10/08/2023 359  150 - 400 K/uL Final   nRBC 10/08/2023 0.0  0.0 - 0.2 % Final   Neutrophils Relative % 10/08/2023 47  % Final   Neutro Abs 10/08/2023 3.5  1.7 - 7.7 K/uL Final   Lymphocytes Relative 10/08/2023 41  % Final   Lymphs Abs 10/08/2023 2.9  0.7 - 4.0 K/uL Final   Monocytes Relative 10/08/2023 8  % Final    Monocytes Absolute 10/08/2023 0.6  0.1 - 1.0 K/uL Final   Eosinophils Relative 10/08/2023 3  % Final   Eosinophils Absolute 10/08/2023 0.2  0.0 - 0.5 K/uL Final   Basophils Relative 10/08/2023 1  % Final   Basophils Absolute 10/08/2023 0.0  0.0 - 0.1 K/uL Final   Immature Granulocytes 10/08/2023 0  % Final   Abs Immature Granulocytes 10/08/2023 0.02  0.00 - 0.07 K/uL Final   Performed at [facility], [address] [place], [place] [address]   TSH 10/08/2023 0.841  0.350 - 4.500 uIU/mL Final   Comment: Performed by a 3rd Generation assay with a functional sensitivity of <=0.01 uIU/mL. Performed at [facility], [address], [place], [place] [address]    Hgb A1c MFr Bld 10/08/2023 5.1  4.8 - 5.6 % Final   Comment: (NOTE) Pre diabetes:          5.7%-6.4%  Diabetes:              >6.4%  Glycemic control for   <7.0% adults with diabetes    Mean Plasma Glucose 10/08/2023 99.67  mg/dL Final   Performed at [facility], [address], [place], [place] [address]   Cholesterol 10/08/2023 154  0 - 200 mg/dL Final   Triglycerides 60/45/4098 104  <150 mg/dL Final   HDL 11/91/4782 39 (L)  >40 mg/dL Final   Total CHOL/HDL Ratio 10/08/2023 3.9  RATIO Final   VLDL 10/08/2023 21  0 - 40 mg/dL Final   LDL Cholesterol 10/08/2023 94  0 - 99 mg/dL Final   Comment:        Total Cholesterol/HDL:CHD Risk Coronary Heart Disease Risk Table                     Men   Women  1/2 Average Risk   3.4   3.3  Average Risk       5.0   4.4  2 X Average Risk   9.6   7.1  3 X Average Risk  23.4   11.0        Use the calculated Patient Ratio above and the CHD Risk Table to determine the patient's CHD Risk.        ATP III CLASSIFICATION (LDL):  <100     mg/dL   Optimal  956-213  mg/dL   Near or Above                    Optimal  130-159  mg/dL   Borderline  086-578  mg/dL   High  >469     mg/dL   Very High Performed at [facility], [address], [place], [place] [address]    Glucose-Capillary 10/10/2023 101 (H)  70 - 99 mg/dL Final   Glucose reference range applies only to samples taken after fasting for at least 8 hours.   Comment 1 10/10/2023 Notify RN   Final   Comment 2 10/10/2023 Document in Chart   Final  Admission on 10/03/2023, Discharged on 10/04/2023  Component Date Value Ref Range Status   Sodium 10/03/2023 141  135 - 145 mmol/L Final   Potassium 10/03/2023 3.5  3.5 - 5.1 mmol/L Final   Chloride 10/03/2023 109  98 - 111 mmol/L Final   CO2 10/03/2023 18 (L)  22 - 32 mmol/L Final   Glucose, Bld 10/03/2023 89  70 - 99 mg/dL Final   Glucose reference range applies only to samples taken after fasting for at least 8 hours.   BUN 10/03/2023 11  6 - 20 mg/dL Final   Creatinine, Ser 10/03/2023 0.84  0.44 - 1.00 mg/dL Final   Calcium 84/13/2440 9.1  8.9 - 10.3 mg/dL Final   Total Protein 06/07/2535 7.7  6.5 - 8.1 g/dL Final   Albumin 64/40/3474 4.1  3.5 - 5.0 g/dL Final   AST 25/95/6387 36  15 - 41 U/L Final   ALT 10/03/2023 54 (H)  0 - 44 U/L Final   Alkaline Phosphatase 10/03/2023 48  38 - 126 U/L Final   Total Bilirubin 10/03/2023 2.0 (H)  0.0 - 1.2 mg/dL Final   GFR, Estimated 10/03/2023 >60  >60 mL/min Final   Comment: (NOTE) Calculated using the CKD-EPI Creatinine Equation (2021)    Anion gap 10/03/2023 14  5 - 15 Final   Performed at [facility], [address] [place], [place] [address]   Alcohol, Ethyl (B) 10/03/2023 <10  <10 mg/dL Final   Comment: (NOTE) Lowest detectable limit for serum alcohol is 10 mg/dL.  For medical purposes only. Performed at [facility], [address], [place], [place] [address]    Opiates 10/03/2023 NONE DETECTED  NONE DETECTED Final   Cocaine 10/03/2023 NONE DETECTED  NONE DETECTED Final   Benzodiazepines 10/03/2023 POSITIVE (A)  NONE DETECTED Final   Amphetamines 10/03/2023 NONE DETECTED  NONE DETECTED Final    Tetrahydrocannabinol 10/03/2023 POSITIVE (A)  NONE DETECTED Final   Barbiturates 10/03/2023 NONE DETECTED  NONE DETECTED Final   Comment: (NOTE) DRUG SCREEN FOR MEDICAL PURPOSES ONLY.  IF CONFIRMATION IS NEEDED FOR ANY PURPOSE, NOTIFY LAB WITHIN 5 DAYS.  LOWEST DETECTABLE LIMITS FOR URINE DRUG SCREEN Drug Class                     Cutoff (ng/mL) Amphetamine and metabolites    1000 Barbiturate and metabolites    200 Benzodiazepine                 200 Opiates and metabolites        300 Cocaine and metabolites        300 THC                            50 Performed at [facility], [address], [place], [place] [address]    WBC 10/03/2023 19.0 (H)  4.0 - 10.5 K/uL Final   RBC 10/03/2023 4.48  3.87 - 5.11 MIL/uL Final   Hemoglobin 10/03/2023 13.1  12.0 - 15.0 g/dL Final   HCT 13/03/6577 36.8  36.0 - 46.0 % Final   MCV 10/03/2023 82.1  80.0 - 100.0 fL Final   MCH 10/03/2023 29.2  26.0 - 34.0 pg Final   MCHC 10/03/2023 35.6  30.0 - 36.0 g/dL Final   RDW 46/96/2952 12.4  11.5 - 15.5 % Final   Platelets 10/03/2023 327  150 - 400 K/uL Final   nRBC 10/03/2023 0.0  0.0 - 0.2 % Final   Neutrophils Relative % 10/03/2023 80  % Final   Neutro Abs 10/03/2023 14.9 (H)  1.7 - 7.7 K/uL Final   Lymphocytes Relative 10/03/2023 14  % Final   Lymphs Abs 10/03/2023 2.7  0.7 - 4.0 K/uL Final   Monocytes Relative 10/03/2023 6  % Final   Monocytes Absolute 10/03/2023 1.2 (H)  0.1 - 1.0 K/uL Final   Eosinophils Relative 10/03/2023 0  % Final   Eosinophils Absolute 10/03/2023 0.0  0.0 - 0.5 K/uL Final   Basophils Relative 10/03/2023 0  % Final   Basophils Absolute 10/03/2023 0.1  0.0 - 0.1 K/uL Final   Immature Granulocytes 10/03/2023 0  % Final   Abs Immature Granulocytes 10/03/2023 0.07  0.00 - 0.07 K/uL Final   Performed at [facility], [address], [place], [place] [address]   Preg, Serum 10/03/2023 NEGATIVE  NEGATIVE Final   Comment:        THE  SENSITIVITY OF THIS METHODOLOGY IS >10 mIU/mL. Performed at [facility], [address] [place], [place] [address]    Specimen Source 10/03/2023 URINE, CLEAN CATCH   Final   Color, Urine 10/03/2023 AMBER (A)  YELLOW Final   BIOCHEMICALS MAY BE AFFECTED BY COLOR   APPearance 10/03/2023 HAZY (A)  CLEAR Final   Specific  Gravity, Urine 10/03/2023 1.034 (H)  1.005 - 1.030 Final   pH 10/03/2023 5.0  5.0 - 8.0 Final   Glucose, UA 10/03/2023 NEGATIVE  NEGATIVE mg/dL Final   Hgb urine dipstick 10/03/2023 MODERATE (A)  NEGATIVE Final   Bilirubin Urine 10/03/2023 SMALL (A)  NEGATIVE Final   Ketones, ur 10/03/2023 80 (A)  NEGATIVE mg/dL Final   Protein, ur 60/45/4098 100 (A)  NEGATIVE mg/dL Final   Nitrite 11/91/4782 NEGATIVE  NEGATIVE Final   Leukocytes,Ua 10/03/2023 NEGATIVE  NEGATIVE Final   RBC / HPF 10/03/2023 6-10  0 - 5 RBC/hpf Final   WBC, UA 10/03/2023 6-10  0 - 5 WBC/hpf Final   Comment:        Reflex urine culture not performed if WBC <=10, OR if Squamous epithelial cells >5. If Squamous epithelial cells >5 suggest recollection.    Bacteria, UA 10/03/2023 RARE (A)  NONE SEEN Final   Squamous Epithelial / HPF 10/03/2023 11-20  0 - 5 /HPF Final   Mucus 10/03/2023 PRESENT   Final   Hyaline Casts, UA 10/03/2023 PRESENT   Final   Performed at [facility], [address] [place], [place] [address]  Office Visit on 07/27/2023  Component Date Value Ref Range Status   WBC 07/27/2023 10.6  3.4 - 10.8 x10E3/uL Final   RBC 07/27/2023 4.87  3.77 - 5.28 x10E6/uL Final   Hemoglobin 07/27/2023 13.9  11.1 - 15.9 g/dL Final   Hematocrit 30/86/5784 42.2  34.0 - 46.6 % Final   MCV 07/27/2023 87  79 - 97 fL Final   MCH 07/27/2023 28.5  26.6 - 33.0 pg Final   MCHC 07/27/2023 32.9  31.5 - 35.7 g/dL Final   RDW 69/62/9528 13.2  11.7 - 15.4 % Final   Platelets 07/27/2023 366  150 - 450 x10E3/uL Final   Cholesterol, Total 07/27/2023 169  100 - 199 mg/dL Final    Triglycerides 07/27/2023 195 (H)  0 - 149 mg/dL Final   HDL 41/32/4401 43  >39 mg/dL Final   VLDL Cholesterol Cal 07/27/2023 33  5 - 40 mg/dL Final   LDL Chol Calc (NIH) 07/27/2023 93  0 - 99 mg/dL Final   Chol/HDL Ratio 07/27/2023 3.9  0.0 - 4.4 ratio Final   Comment:                                   T. Chol/HDL Ratio                                             Men  Women                               1/2 Avg.Risk  3.4    3.3                                   Avg.Risk  5.0    4.4                                2X Avg.Risk  9.6    7.1  3X Avg.Risk 23.4   11.0    Glucose 07/27/2023 85  70 - 99 mg/dL Final   BUN 16/05/9603 9  6 - 20 mg/dL Final   Creatinine, Ser 07/27/2023 1.23 (H)  0.57 - 1.00 mg/dL Final   eGFR 54/04/8118 60  >59 mL/min/1.73 Final   BUN/Creatinine Ratio 07/27/2023 7 (L)  9 - 23 Final   Sodium 07/27/2023 141  134 - 144 mmol/L Final   Potassium 07/27/2023 4.8  3.5 - 5.2 mmol/L Final   Chloride 07/27/2023 106  96 - 106 mmol/L Final   CO2 07/27/2023 20  20 - 29 mmol/L Final   Calcium 07/27/2023 9.1  8.7 - 10.2 mg/dL Final   Total Protein 14/78/2956 6.2  6.0 - 8.5 g/dL Final   Albumin 21/30/8657 3.9  3.9 - 4.9 g/dL Final   Globulin, Total 07/27/2023 2.3  1.5 - 4.5 g/dL Final   Bilirubin Total 07/27/2023 <0.2  0.0 - 1.2 mg/dL Final   Alkaline Phosphatase 07/27/2023 63  44 - 121 IU/L Final   AST 07/27/2023 16  0 - 40 IU/L Final   ALT 07/27/2023 15  0 - 32 IU/L Final   Hgb A1c MFr Bld 07/27/2023 5.3  4.8 - 5.6 % Final   Comment:          Prediabetes: 5.7 - 6.4          Diabetes: >6.4          Glycemic control for adults with diabetes: <7.0    Est. average glucose Bld gHb Est-m* 07/27/2023 105  mg/dL Final   TSH 84/69/6295 0.927  0.450 - 4.500 uIU/mL Final   hCG,Beta Subunit,Qual,Serum 07/27/2023 Negative  Negative <6 mIU/mL Final   Preg Test, Ur 07/27/2023 Negative  Negative Final    Allergies: Mustard, Oat, Latex, Amoxicillin , and  Other  Medications:  Facility Ordered Medications  Medication   acetaminophen  (TYLENOL ) tablet 650 mg   alum & mag hydroxide-simeth (MAALOX/MYLANTA) 200-200-20 MG/5ML suspension 30 mL   magnesium  hydroxide (MILK OF MAGNESIA) suspension 30 mL   OLANZapine  zydis (ZYPREXA ) disintegrating tablet 5 mg   OLANZapine  (ZYPREXA ) injection 5 mg   OLANZapine  (ZYPREXA ) injection 10 mg   albuterol  (VENTOLIN  HFA) 108 (90 Base) MCG/ACT inhaler 2 puff   EPINEPHrine  (EPI-PEN) injection 0.3 mg   nicotine  (NICODERM CQ  - dosed in mg/24 hours) patch 14 mg   nicotine  polacrilex (NICORETTE ) gum 2 mg   [COMPLETED] ARIPiprazole  (ABILIFY ) tablet 2 mg   traZODone  (DESYREL ) tablet 50 mg   PTA Medications  Medication Sig   Multiple Vitamin (MULTIVITAMIN WITH MINERALS) TABS tablet Take 1 tablet by mouth daily.   EPINEPHrine  0.3 mg/0.3 mL IJ SOAJ injection Inject 0.3 mg into the muscle as needed (anaphylactic reaction).   nicotine  (NICODERM CQ  - DOSED IN MG/24 HOURS) 14 mg/24hr patch Place 1 patch (14 mg total) onto the skin daily. (Patient not taking: Reported on 10/21/2023)   nicotine  polacrilex (NICORETTE ) 2 MG gum Take 1 each (2 mg total) by mouth as needed for smoking cessation. (Patient not taking: Reported on 10/21/2023)   vitamin D3 (CHOLECALCIFEROL ) 25 MCG tablet Take 1 tablet (1,000 Units total) by mouth daily. (Patient not taking: Reported on 10/21/2023)   albuterol  (VENTOLIN  HFA) 108 (90 Base) MCG/ACT inhaler Inhale 2 puffs into the lungs every 6 (six) hours as needed for wheezing or shortness of breath.   Norethindrone Acetate-Ethinyl Estrad-FE (BLISOVI 24 FE) 1-20 MG-MCG(24) tablet TAKE 1 TABLET BY MOUTH DAILY      Medical Decision  Making  Observation unit    Recommendations  Based on my evaluation the patient does not appear to have an emergency medical condition.  Dorthea Gauze, NP 01/01/24  5:02 AM

## 2023-12-31 NOTE — Progress Notes (Signed)
   12/31/23 2046  [facility] Triage Screening (Walk-ins at [facility] only)  How Did You Hear About Us ? Legal System  What Is the Reason for Your Visit/Call Today? Pt presents to [facility] voluntarily, accompanied by [employer]/[facility] due to medication refill (Abilify  shot). Pt reports that she is approaching a month since last dose. Pt endorsed SI, no plan or intent earlier today, but denies at this time. Pt states " i just know I have too much to live for". Pt is tearful at times as she describes stress and depression due to loss of pregnancy. Pt reports diagnosis of schizoaffective disorder. Pt also reports being established with [facility] for outpatient therapy. Pt currently denies SI,HI,AVH and alcohol use.  How Long Has This Been Causing You Problems? <Week  Have You Recently Had Any Thoughts About Hurting Yourself? Yes  How long ago did you have thoughts about hurting yourself? earlier today, denies at this time  Are You Planning to Commit Suicide/Harm Yourself At This time? No  Have you Recently Had Thoughts About Hurting Someone [name]? No  Are You Planning To Harm Someone At This Time? No  Physical Abuse Denies  Verbal Abuse Denies  Sexual Abuse Denies  Exploitation of patient/patient's resources Denies  Self-Neglect Denies  Are you currently experiencing any auditory, visual or other hallucinations? No  Have You Used Any Alcohol or Drugs in the Past 24 Hours? Yes  What Did You Use and How Much? marijuana  Do you have any current medical co-morbidities that require immediate attention? No  Clinician description of patient physical appearance/behavior: cooperative, tearful at times  What Do You Feel Would Help You the Most Today? Medication(s)  If access to [facility] was not available, would you have sought care in the Emergency Department? Yes  Determination of Need Routine (7 days)  Options For Referral Other: Comment;Medication Management;Outpatient Therapy

## 2023-12-31 NOTE — Congregational Nurse Program (Signed)
 Client to RN office. She states she needs to go to pharmacy to pick up Abilify . RN assisted with request by giving bus passes. RN also made client aware that if she needs help with paying for medication, RN can assist with resources. No further concerns at this time.

## 2024-01-01 ENCOUNTER — Other Ambulatory Visit: Payer: Self-pay

## 2024-01-01 LAB — COMPREHENSIVE METABOLIC PANEL WITH GFR
ALT: 23 U/L (ref 0–44)
AST: 17 U/L (ref 15–41)
Albumin: 3.8 g/dL (ref 3.5–5.0)
Alkaline Phosphatase: 61 U/L (ref 38–126)
Anion gap: 10 (ref 5–15)
BUN: 8 mg/dL (ref 6–20)
CO2: 24 mmol/L (ref 22–32)
Calcium: 9.2 mg/dL (ref 8.9–10.3)
Chloride: 105 mmol/L (ref 98–111)
Creatinine, Ser: 0.75 mg/dL (ref 0.44–1.00)
GFR, Estimated: >60 mL/min (ref >60)
Glucose, Bld: 97 mg/dL (ref 70–99)
Potassium: 3.7 mmol/L (ref 3.5–5.1)
Sodium: 139 mmol/L (ref 135–145)
Total Bilirubin: 0.5 mg/dL (ref 0.0–1.2)
Total Protein: 7 g/dL (ref 6.5–8.1)

## 2024-01-01 LAB — ETHANOL: Alcohol, Ethyl (B): <15 mg/dL (ref <15)

## 2024-01-01 NOTE — Progress Notes (Signed)
 Pt is presently resting quietly. Respirations are even and unlabored. No signs of acute distress noted. Staff will monitor for pt's safety.

## 2024-01-01 NOTE — Progress Notes (Signed)
 Pt is awake, alert and oriented X3. Pt complained of left knee pain. No signs of acute distress noted.  PRN Acetaminophen  administered per order. Pt denies current SI/HI/AVH, plan or intent. Staff will monitor for pt's safety.

## 2024-01-01 NOTE — Discharge Summary (Signed)
 Kelly Crosby to be discharged Home per NP order. Discussed with the patient and all questions fully answered. An After Visit Summary was printed and given to the patient. All belongings returned. Patient escorted out and discharged home via private auto.  Minus Amel  01/01/2024 10:05 AM

## 2024-01-01 NOTE — ED Provider Notes (Signed)
 FBC/OBS ASAP Discharge Summary  Date and Time: 01/01/2024 8:41 AM  Name: Kelly Crosby  MRN:  [id number]   Discharge Diagnoses:  Final diagnoses:  Manic behavior (HCC)  Encounter for medication refill  Behavior concern in adult  Schizoaffective disorder, bipolar type (HCC)  Marijuana abuse    Subjective: Kelly Crosby 33 y.o., female patient presented to [facility] as a voluntary walk in accompanied by BHRT with complaints of needing her Abilify  Maintenna 400mg  LAI. She has a PPH of Schizoaffective d/o, Bipolar 1 d/o, PTSD, suicide attempt, and GAD. She does not currently have an outpatient psychiatrist  but does receive therapy. She states that she was supposed to have an appt with her PCP on 12/23/2023 to further discuss her long-acting injections, however she missed the appointment thinking that it was on the 15th.  Kelly Crosby, is seen face to face by this provider and chart reviewed on 01/01/24.  On evaluation Kelly Crosby reports that she last had her Abilify  injection April around this time but is unsure of exactly what date.  She reports that she picked up the vial of medication from [facility] and was administered the injection by nurse at the [facility] in April.  She reports that she does currently have a refill of that injection at the [facility] available to pick up however she did not have the money to pay for it at.  I spoke with pharmacy at [facility] to see if they can verify the last injection date, however they were unable to get in contact with the [facility]  to speak with the nursing staff.  I also attempted to call the [facility] to speak with the medical to see when the patient received the last injection however they did not answer and a voicemail was left.  [facility] was able to verify that she does have a refill available to her at the [facility] pharmacy and it is available for pickup.  Discussed this with patient that she does have a refill at the pharmacy and she states that she can pick  it up and get it is administered at the [facility] and that they will also help her pay for the prescription.  She states that she would like transportation to the [facility] so that she can get assistance with payment of the prescription and get the Abilify  Maintena LAI administered there, since we were unable to verify her last injection date here.  We also discussed that she needs to follow up with [facility] Monday-Friday to continue with monthly LAI and medication management.   She reports that yesterday she felt very stressed and overwhelmed causing her to have thoughts of death or dying but did not want to kill herself. She reports that she currently lives alone but is ready to get home to her new dog that she is missing.  She reports feeling much better today, after getting a good night's sleep. She denies current suicidal and homicidal ideations. She reports that she likes the Abilify  Maintenna injection and that it is effective for stabilizing her mood. She reports that she is currently employed and will be taking college classes online. She endorses THC use.   During evaluation Kelly Crosby is sitting up watching TV,in no acute distress. She is alert & oriented x 4, calm, cooperative and attentive for this assessment.  Her mood is euthymic with congruent affect.  She has normal speech, and behavior.  Objectively there is no evidence of  psychosis/mania or delusional thinking. Pt does not appear to be responding to internal or external stimuli.  Patient is able to converse coherently, goal directed thoughts, no distractibility, or pre-occupation.  She also denies current suicidal/self-harm/homicidal ideation, psychosis, and paranoia.  Patient answered assessment questions appropriately.    Stay Summary: 12/31/23  Dorthea Gauze, NP            Kelly Crosby, [name], 33 y/o female with a history of schizoaffective disorder, psychotic episodes, homelessness, suicidal ideation.  Presented to [facility] via [facility].  Per  the patient she was having an episode and got triggered not receiving her Abilify  injection.  According to the patient she missed her shot and it is time to get 1 now.  According to the patient she lives alone, self-employed, and was having suicidal thoughts today but she is currently not suicidal.  Patient also stated that she last was hospitalized at [facility] in March.  I review of patient records show multiple ED visits for similar condition.  Copied from triage note: Kelly- presents to [facility] voluntarily, accompanied by [facility]/BHRT due to medication refill (Abilify  shot). Pt reports that she is approaching a month since last dose. Pt endorsed SI, no plan or intent earlier today, but denies at this time. Pt states " i just know I have too much to live for". Pt is tearful at times as she describes stress and depression due to loss of pregnancy. Pt reports diagnosis of schizoaffective disorder. Pt also reports being established with [facility] [place] for outpatient therapy. Pt currently denies SI,HI,AVH and alcohol use.  Face-to-face evaluation of patient, patient is alert and oriented x 4, speech is clear, maintained minimal eye contact.  Patient does appear anxious and worried.  Patient currently denies SI, HI, AVH or paranoia.  Reports she smoked marijuana on a daily basis the last time was within the last 24 hours.  Denies alcohol use.  Denies any other illicit drug use.  Currently denies access to guns.  Patient currently denies wanting to hurt herself or others.  At this present moment she does not appear to be influenced by internal stimuli.  Patient stating that she can tell the way she feels that she need to get her Abilify  injection.  Given patient prior history and current presentation writer discussed with patient the need for admissions and also discussed with patient that she could get her Abilify  shot in the morning when the walking psychiatry comes in.  For Horticulturist, commercial will order  Abilify  1 mg p.o.  Recommend observation  Total Time spent with patient: 20 minutes  Past Psychiatric History: Hx of Schizoaffective d/o, Bipolar 1 d/o, PTSD, suicide attempt, GAD Past Medical History: HTN, A-Fib Family History: Mother- HTN Family Psychiatric History: Father- PTSD Social History: Pt is currently employed and living alone at home with her dog. UDS positive for THC.  Tobacco Cessation:  A prescription for an FDA-approved tobacco cessation medication was offered at discharge and the patient refused  Current Medications:  Current Facility-Administered Medications  Medication Dose Route Frequency Provider Last Rate Last Admin   acetaminophen  (TYLENOL ) tablet 650 mg  650 mg Oral Q6H PRN Dorthea Gauze, NP   650 mg at 01/01/24 0829   albuterol  (VENTOLIN  HFA) 108 (90 Base) MCG/ACT inhaler 2 puff  2 puff Inhalation Q6H PRN Dorthea Gauze, NP       alum & mag hydroxide-simeth (MAALOX/MYLANTA) 200-200-20 MG/5ML suspension 30 mL  30 mL Oral Q4H PRN Dorthea Gauze, NP  EPINEPHrine  (EPI-PEN) injection 0.3 mg  0.3 mg Intramuscular PRN Dorthea Gauze, NP       magnesium  hydroxide (MILK OF MAGNESIA) suspension 30 mL  30 mL Oral Daily PRN Dorthea Gauze, NP       nicotine  (NICODERM CQ  - dosed in mg/24 hours) patch 14 mg  14 mg Transdermal Daily Dorthea Gauze, NP       nicotine  polacrilex (NICORETTE ) gum 2 mg  2 mg Oral PRN Dorthea Gauze, NP       OLANZapine  (ZYPREXA ) injection 10 mg  10 mg Intramuscular TID PRN Dorthea Gauze, NP       OLANZapine  (ZYPREXA ) injection 5 mg  5 mg Intramuscular TID PRN Dorthea Gauze, NP       OLANZapine  zydis (ZYPREXA ) disintegrating tablet 5 mg  5 mg Oral TID PRN Dorthea Gauze, NP       traZODone  (DESYREL ) tablet 50 mg  50 mg Oral QHS Dorthea Gauze, NP       Current Outpatient Medications  Medication Sig Dispense Refill   ABILIFY  MAINTENA 400 MG SRER injection Inject 400 mg into the muscle every 28 (twenty-eight) days.     albuterol  (VENTOLIN  HFA) 108 (90  Base) MCG/ACT inhaler Inhale 2 puffs into the lungs every 6 (six) hours as needed for wheezing or shortness of breath.     Multiple Vitamin (MULTIVITAMIN WITH MINERALS) TABS tablet Take 1 tablet by mouth daily.     EPINEPHrine  0.3 mg/0.3 mL IJ SOAJ injection Inject 0.3 mg into the muscle as needed (anaphylactic reaction). (Patient not taking: Reported on 01/01/2024) 1 each 0   Norethindrone Acetate-Ethinyl Estrad-FE (BLISOVI 24 FE) 1-20 MG-MCG(24) tablet TAKE 1 TABLET BY MOUTH DAILY (Patient not taking: Reported on 01/01/2024) 28 tablet 4    PTA Medications:  Facility Ordered Medications  Medication   acetaminophen  (TYLENOL ) tablet 650 mg   alum & mag hydroxide-simeth (MAALOX/MYLANTA) 200-200-20 MG/5ML suspension 30 mL   magnesium  hydroxide (MILK OF MAGNESIA) suspension 30 mL   OLANZapine  zydis (ZYPREXA ) disintegrating tablet 5 mg   OLANZapine  (ZYPREXA ) injection 5 mg   OLANZapine  (ZYPREXA ) injection 10 mg   albuterol  (VENTOLIN  HFA) 108 (90 Base) MCG/ACT inhaler 2 puff   EPINEPHrine  (EPI-PEN) injection 0.3 mg   nicotine  (NICODERM CQ  - dosed in mg/24 hours) patch 14 mg   nicotine  polacrilex (NICORETTE ) gum 2 mg   [COMPLETED] ARIPiprazole  (ABILIFY ) tablet 2 mg   traZODone  (DESYREL ) tablet 50 mg   PTA Medications  Medication Sig   Multiple Vitamin (MULTIVITAMIN WITH MINERALS) TABS tablet Take 1 tablet by mouth daily.   albuterol  (VENTOLIN  HFA) 108 (90 Base) MCG/ACT inhaler Inhale 2 puffs into the lungs every 6 (six) hours as needed for wheezing or shortness of breath.   ABILIFY  MAINTENA 400 MG SRER injection Inject 400 mg into the muscle every 28 (twenty-eight) days.   EPINEPHrine  0.3 mg/0.3 mL IJ SOAJ injection Inject 0.3 mg into the muscle as needed (anaphylactic reaction). (Patient not taking: Reported on 01/01/2024)   Norethindrone Acetate-Ethinyl Estrad-FE (BLISOVI 24 FE) 1-20 MG-MCG(24) tablet TAKE 1 TABLET BY MOUTH DAILY (Patient not taking: Reported on 01/01/2024)       06/17/2023     3:17 PM 01/28/2023    5:27 PM 05/05/2022    3:26 PM  Depression screen PHQ 2/9  Decreased Interest 0 1 3  Down, Depressed, Hopeless 0 1 2  PHQ - 2 Score 0 2 5  Altered sleeping  3 2  Tired, decreased energy  2 3  Change in appetite  2 2  Feeling bad or failure about yourself   1 2  Trouble concentrating  1 3  Moving slowly or fidgety/restless  0 1  Suicidal thoughts  0 0  PHQ-9 Score  11 18  Difficult doing work/chores   Somewhat difficult    Flowsheet Row ED from 12/31/2023 in [facility] ED from 12/27/2023 in [facility] Emergency Department at [facility] ED from 12/18/2023 in [facility] Department at [facility]  C-SSRS RISK CATEGORY Low Risk No Risk No Risk       Musculoskeletal  Strength & Muscle Tone: within normal limits Gait & Station: normal Patient leans: N/A  Psychiatric Specialty Exam  Presentation  General Appearance:  Appropriate for Environment  Eye Contact: Good  Speech: Clear and Coherent  Speech Volume: Normal  Handedness: Right   Mood and Affect  Mood: Euthymic  Affect: Appropriate   Thought Process  Thought Processes: Coherent; Linear  Descriptions of Associations:Intact  Orientation:Full (Time, Place and Person)  Thought Content:WDL  Diagnosis of Schizophrenia or Schizoaffective disorder in past: Yes  Duration of Psychotic Symptoms: No data recorded  Hallucinations:Hallucinations: None  Ideas of Reference:None  Suicidal Thoughts:Suicidal Thoughts: No SI Passive Intent and/or Plan: Without Intent; Without Plan  Homicidal Thoughts:Homicidal Thoughts: No   Sensorium  Memory: Immediate Fair; Recent Fair  Judgment: Fair  Insight: Good   Executive Functions  Concentration: Fair  Attention Span: Good  Recall: Fair  Fund of Knowledge: Fair  Language: Good   Psychomotor Activity  Psychomotor Activity: Psychomotor Activity: Normal   Assets   Assets: Desire for Improvement; Housing; Physical Health; Social Support; Vocational/Educational   Sleep  Sleep: Sleep: Good Number of Hours of Sleep: 8   Nutritional Assessment (For OBS and FBC admissions only) Has the patient had a weight loss or gain of 10 pounds or more in the last 3 months?: No Has the patient had a decrease in food intake/or appetite?: No Does the patient have dental problems?: No Does the patient have eating habits or behaviors that may be indicators of an eating disorder including binging or inducing vomiting?: No Has the patient recently lost weight without trying?: 0 Has the patient been eating poorly because of a decreased appetite?: 0 Malnutrition Screening Tool Score: 0    Physical Exam  Physical Exam Vitals and nursing note reviewed.  Constitutional:      Appearance: Normal appearance.  HENT:     Head: Normocephalic.     Nose: Nose normal.  Eyes:     Extraocular Movements: Extraocular movements intact.  Cardiovascular:     Rate and Rhythm: Normal rate.  Pulmonary:     Effort: Pulmonary effort is normal.  Musculoskeletal:        General: Normal range of motion.     Cervical back: Normal range of motion.  Neurological:     General: No focal deficit present.     Mental Status: She is alert and oriented to person, place, and time.    Review of Systems  Constitutional: Negative.   HENT: Negative.    Eyes: Negative.   Respiratory: Negative.    Cardiovascular: Negative.   Gastrointestinal: Negative.   Genitourinary: Negative.   Musculoskeletal: Negative.   Neurological: Negative.   Endo/Heme/Allergies: Negative.   Psychiatric/Behavioral:  Positive for depression and substance abuse.    Blood pressure 111/78, pulse 68, temperature 98.8 F (37.1 C), temperature source Oral, resp. rate 18, SpO2 97%. There is no height or weight  on file to calculate BMI.  Demographic Factors:  Low socioeconomic status and Living alone  Loss  Factors: Financial problems/change in socioeconomic status  Historical Factors: Prior suicide attempts  Risk Reduction Factors:   Sense of responsibility to family, Employed, Positive social support, Positive therapeutic relationship, and Positive coping skills or problem solving skills  Continued Clinical Symptoms:  Bipolar Disorder:   Mixed State  Cognitive Features That Contribute To Risk:  None    Suicide Risk:  Mild:  Suicidal ideation of limited frequency, intensity, duration, and specificity.  There are no identifiable plans, no associated intent, mild dysphoria and related symptoms, good self-control (both objective and subjective assessment), few other risk factors, and identifiable protective factors, including available and accessible social support.  Plan Of Care/Follow-up recommendations:  Based on my evaluation, pt is able to be discharged home today. Pt is able to contract for safety and denies suicidal ideations. Pt was requesting to get her Abilify  Maintenna LAI prior to discharging, however this Clinical research associate and pharmacy staff were unable to verify her last dosage. Multiple attempts were made to call [name] for injection verification with no response. She is provided with transportation to the [facility] to obtain resources to help pay for her prescription that is available for pick up at [facility]. She also plans to have them administer the Abilify  Maintenna 400mg  LAI since they have record of when it was last administered. She will follow up with [facility] for medication management and [facility] [place] for therapy.   Disposition: Discharged home with outpatient resources.   Davia Erps, NP 01/01/2024, 8:41 AM

## 2024-01-01 NOTE — ED Notes (Signed)
 Patient resting quietly in bed with eyes closed. Respirations equal and unlabored, skin warm and dry, NAD. Routine safety checks conducted according to facility protocol. Will continue to monitor for safety.

## 2024-01-01 NOTE — Discharge Instructions (Addendum)
 Discharge recommendations:   Medications: No new medications. Patient is to take medications as prescribed. The patient or patient's guardian is to contact a medical professional and/or outpatient provider to address any new side effects that develop. The patient or the patient's guardian should update outpatient providers of any new medications and/or medication changes.    Outpatient Follow up: Please follow up at [facility] for medication management. Please review list of outpatient resources for psychiatry and counseling. Please follow up with your primary care provider for all medical related needs.    Therapy: Follow up with [facility] [place] for  therapy. We recommend that patient participate in individual therapy to address mental health concerns.   Atypical antipsychotics: If you are prescribed an atypical antipsychotic, it is recommended that your height, weight, BMI, blood pressure, fasting lipid panel, and fasting blood sugar be monitored by your outpatient providers.  Safety:   The following safety precautions should be taken:   No sharp objects. This includes scissors, razors, scrapers, and putty knives.   Chemicals should be removed and locked up.   Medications should be removed and locked up.   Weapons should be removed and locked up. This includes firearms, knives and instruments that can be used to cause injury.   The patient should abstain from use of illicit substances/drugs and abuse of any medications.  If symptoms worsen or do not continue to improve or if the patient becomes actively suicidal or homicidal then it is recommended that the patient return to the closest hospital emergency department, the [facility], or call 911 for further evaluation and treatment. National Suicide Prevention Lifeline 1-800-SUICIDE or 531-446-7097.  About 988 988 offers 24/7 access to trained  crisis counselors who can help people experiencing mental health-related distress. People can call or text 988 or chat 988lifeline.org for themselves or if they are worried about a loved one who may need crisis support.    You are encouraged to follow up with [facility] for outpatient treatment.  Walk in/ Open Access Hours: Monday - Friday (To see provider and therapist) PLEASE ARRIVE AT 7:00AM for walk in services    [facility] [address] [place], [place] [phone]

## 2024-01-14 ENCOUNTER — Ambulatory Visit: Payer: MEDICAID | Admitting: Obstetrics and Gynecology

## 2024-02-11 NOTE — Congregational Nurse Program (Signed)
 Client to RN office for assistance getting her Abilify  injection. States her last dose was June 2nd 2025 and it was given by Ronal Jenkins Houseman, NP at [facility]. Client is attempting to get new prescription from pharmacy and they state there are no more refills. Client has not been set up by Outpatient services to follow mental health and medication needs. RN will assist client with getting established with care. RN contacted [facility] to arrange appointment. 02/15/24 at 1300, our hope is to help her get scheduled for IM injection to prevent mental health crisis'. No further concerns at this time.

## 2024-02-15 ENCOUNTER — Ambulatory Visit (INDEPENDENT_AMBULATORY_CARE_PROVIDER_SITE_OTHER): Payer: MEDICAID | Admitting: Physician Assistant

## 2024-02-15 VITALS — BP 107/71 | HR 53 | Temp 98.1°F | Ht 66.0 in | Wt 228.8 lb

## 2024-02-15 DIAGNOSIS — F319 Bipolar disorder, unspecified: Secondary | ICD-10-CM | POA: Diagnosis not present

## 2024-02-15 DIAGNOSIS — F411 Generalized anxiety disorder: Secondary | ICD-10-CM | POA: Diagnosis not present

## 2024-02-15 DIAGNOSIS — F4310 Post-traumatic stress disorder, unspecified: Secondary | ICD-10-CM

## 2024-02-15 DIAGNOSIS — F1220 Cannabis dependence, uncomplicated: Secondary | ICD-10-CM

## 2024-02-15 MED ORDER — ABILIFY MAINTENA 400 MG IM SRER: 400.0000 mg | INTRAMUSCULAR | 11 refills | Status: AC
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
## 2024-02-16 ENCOUNTER — Encounter (HOSPITAL_COMMUNITY): Payer: Self-pay | Admitting: Physician Assistant

## 2024-02-17 ENCOUNTER — Ambulatory Visit (HOSPITAL_COMMUNITY): Payer: MEDICAID

## 2024-02-17 ENCOUNTER — Telehealth (HOSPITAL_COMMUNITY): Payer: Self-pay | Admitting: Physician Assistant
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
## 2024-03-31 ENCOUNTER — Encounter (HOSPITAL_COMMUNITY): Payer: MEDICAID
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
## 2024-04-05 ENCOUNTER — Ambulatory Visit (HOSPITAL_COMMUNITY): Payer: MEDICAID

## 2024-04-12 ENCOUNTER — Ambulatory Visit (HOSPITAL_COMMUNITY): Payer: MEDICAID | Admitting: Mental Health

## 2024-04-13 ENCOUNTER — Ambulatory Visit (HOSPITAL_COMMUNITY): Payer: MEDICAID | Admitting: Mental Health

## 2024-04-14 ENCOUNTER — Encounter (HOSPITAL_COMMUNITY): Payer: MEDICAID

## 2024-07-27 ENCOUNTER — Encounter: Payer: MEDICAID | Admitting: Family

## 2024-07-27 NOTE — Progress Notes (Signed)
 Erroneous encounter-disregard
